# Patient Record
Sex: Male | Born: 1935 | Race: Black or African American | Hispanic: No | Marital: Married | Smoking: Former smoker
Health system: Southern US, Community
[De-identification: ages and names within clinical notes are randomized; demographics above are authoritative.]

## PROBLEM LIST (undated history)

## (undated) DIAGNOSIS — R0601 Orthopnea: Secondary | ICD-10-CM

## (undated) DIAGNOSIS — E119 Type 2 diabetes mellitus without complications: Secondary | ICD-10-CM

## (undated) DIAGNOSIS — F32A Depression, unspecified: Secondary | ICD-10-CM

## (undated) DIAGNOSIS — M1990 Unspecified osteoarthritis, unspecified site: Secondary | ICD-10-CM

## (undated) DIAGNOSIS — G8929 Other chronic pain: Secondary | ICD-10-CM

## (undated) DIAGNOSIS — J9691 Respiratory failure, unspecified with hypoxia: Secondary | ICD-10-CM

## (undated) DIAGNOSIS — F329 Major depressive disorder, single episode, unspecified: Secondary | ICD-10-CM

## (undated) DIAGNOSIS — Z87891 Personal history of nicotine dependence: Secondary | ICD-10-CM

## (undated) DIAGNOSIS — E1142 Type 2 diabetes mellitus with diabetic polyneuropathy: Secondary | ICD-10-CM

## (undated) DIAGNOSIS — M545 Low back pain, unspecified: Secondary | ICD-10-CM

## (undated) DIAGNOSIS — N184 Chronic kidney disease, stage 4 (severe): Secondary | ICD-10-CM

## (undated) DIAGNOSIS — I5032 Chronic diastolic (congestive) heart failure: Secondary | ICD-10-CM

## (undated) DIAGNOSIS — N529 Male erectile dysfunction, unspecified: Secondary | ICD-10-CM

## (undated) DIAGNOSIS — I10 Essential (primary) hypertension: Secondary | ICD-10-CM

## (undated) DIAGNOSIS — R0600 Dyspnea, unspecified: Secondary | ICD-10-CM

## (undated) DIAGNOSIS — N179 Acute kidney failure, unspecified: Secondary | ICD-10-CM

## (undated) DIAGNOSIS — N189 Chronic kidney disease, unspecified: Secondary | ICD-10-CM

## (undated) DIAGNOSIS — E785 Hyperlipidemia, unspecified: Secondary | ICD-10-CM

## (undated) DIAGNOSIS — I639 Cerebral infarction, unspecified: Secondary | ICD-10-CM

## (undated) DIAGNOSIS — F0390 Unspecified dementia without behavioral disturbance: Secondary | ICD-10-CM

## (undated) HISTORY — PX: LUMBAR DISC SURGERY: SHX700

## (undated) HISTORY — DX: Male erectile dysfunction, unspecified: N52.9

## (undated) HISTORY — PX: BACK SURGERY: SHX140

## (undated) HISTORY — PX: CATARACT EXTRACTION W/ INTRAOCULAR LENS  IMPLANT, BILATERAL: SHX1307

## (undated) HISTORY — DX: Hyperlipidemia, unspecified: E78.5

## (undated) HISTORY — DX: Personal history of nicotine dependence: Z87.891

## (undated) HISTORY — DX: Unspecified osteoarthritis, unspecified site: M19.90

## (undated) HISTORY — DX: Depression, unspecified: F32.A

## (undated) HISTORY — DX: Essential (primary) hypertension: I10

## (undated) HISTORY — DX: Major depressive disorder, single episode, unspecified: F32.9

---

## 1969-03-19 HISTORY — PX: LESION EXCISION: SHX5167

## 1979-03-20 DIAGNOSIS — I639 Cerebral infarction, unspecified: Secondary | ICD-10-CM

## 1979-03-20 HISTORY — DX: Cerebral infarction, unspecified: I63.9

## 1997-12-04 ENCOUNTER — Ambulatory Visit (HOSPITAL_COMMUNITY): Admission: RE | Admit: 1997-12-04 | Discharge: 1997-12-04 | Payer: Self-pay | Admitting: Neurosurgery

## 1999-08-04 HISTORY — PX: ELECTROCARDIOGRAM: SHX264

## 1999-08-13 HISTORY — PX: OTHER SURGICAL HISTORY: SHX169

## 2004-02-13 HISTORY — PX: OTHER SURGICAL HISTORY: SHX169

## 2004-05-22 ENCOUNTER — Ambulatory Visit: Payer: Self-pay | Admitting: Internal Medicine

## 2004-05-29 ENCOUNTER — Ambulatory Visit: Payer: Self-pay | Admitting: Internal Medicine

## 2004-06-02 ENCOUNTER — Ambulatory Visit: Payer: Self-pay | Admitting: Internal Medicine

## 2004-09-08 ENCOUNTER — Ambulatory Visit: Payer: Self-pay | Admitting: Internal Medicine

## 2004-12-24 ENCOUNTER — Ambulatory Visit: Payer: Self-pay | Admitting: Internal Medicine

## 2005-03-11 ENCOUNTER — Ambulatory Visit: Payer: Self-pay | Admitting: Internal Medicine

## 2005-04-05 ENCOUNTER — Ambulatory Visit: Payer: Self-pay | Admitting: Internal Medicine

## 2005-06-17 ENCOUNTER — Ambulatory Visit: Payer: Self-pay | Admitting: Internal Medicine

## 2005-09-23 ENCOUNTER — Ambulatory Visit: Payer: Self-pay | Admitting: Internal Medicine

## 2005-09-24 ENCOUNTER — Ambulatory Visit: Payer: Self-pay | Admitting: Internal Medicine

## 2005-10-12 ENCOUNTER — Ambulatory Visit: Payer: Self-pay | Admitting: Internal Medicine

## 2006-01-18 ENCOUNTER — Ambulatory Visit: Payer: Self-pay | Admitting: Internal Medicine

## 2006-01-22 ENCOUNTER — Ambulatory Visit: Payer: Self-pay | Admitting: Family Medicine

## 2006-01-24 ENCOUNTER — Ambulatory Visit: Payer: Self-pay | Admitting: Internal Medicine

## 2006-03-31 ENCOUNTER — Ambulatory Visit: Payer: Self-pay | Admitting: Internal Medicine

## 2006-08-24 ENCOUNTER — Ambulatory Visit: Payer: Self-pay | Admitting: Internal Medicine

## 2006-08-24 LAB — CONVERTED CEMR LAB
AST: 38 units/L — ABNORMAL HIGH (ref 0–37)
BUN: 36 mg/dL — ABNORMAL HIGH (ref 6–23)
Calcium: 9.6 mg/dL (ref 8.4–10.5)
Chloride: 106 meq/L (ref 96–112)
Direct LDL: 39.6 mg/dL
GFR calc Af Amer: 34 mL/min
GFR calc non Af Amer: 28 mL/min
Hgb A1c MFr Bld: 7.8 % — ABNORMAL HIGH (ref 4.6–6.0)
Total CHOL/HDL Ratio: 3.5
VLDL: 51 mg/dL — ABNORMAL HIGH (ref 0–40)

## 2007-01-16 ENCOUNTER — Ambulatory Visit: Payer: Self-pay | Admitting: Internal Medicine

## 2007-01-28 ENCOUNTER — Encounter: Payer: Self-pay | Admitting: Endocrinology

## 2007-01-28 DIAGNOSIS — N184 Chronic kidney disease, stage 4 (severe): Secondary | ICD-10-CM

## 2007-01-28 DIAGNOSIS — I10 Essential (primary) hypertension: Secondary | ICD-10-CM

## 2007-02-09 ENCOUNTER — Ambulatory Visit: Payer: Self-pay | Admitting: Internal Medicine

## 2007-02-22 ENCOUNTER — Encounter: Admission: RE | Admit: 2007-02-22 | Discharge: 2007-02-22 | Payer: Self-pay | Admitting: Otolaryngology

## 2007-02-24 ENCOUNTER — Ambulatory Visit (HOSPITAL_BASED_OUTPATIENT_CLINIC_OR_DEPARTMENT_OTHER): Admission: RE | Admit: 2007-02-24 | Discharge: 2007-02-24 | Payer: Self-pay | Admitting: Otolaryngology

## 2007-02-24 ENCOUNTER — Encounter (INDEPENDENT_AMBULATORY_CARE_PROVIDER_SITE_OTHER): Payer: Self-pay | Admitting: Otolaryngology

## 2007-05-11 ENCOUNTER — Encounter: Payer: Self-pay | Admitting: Internal Medicine

## 2007-05-19 ENCOUNTER — Encounter: Payer: Self-pay | Admitting: Internal Medicine

## 2007-05-19 ENCOUNTER — Ambulatory Visit: Payer: Self-pay | Admitting: Internal Medicine

## 2007-05-22 ENCOUNTER — Telehealth: Payer: Self-pay | Admitting: Internal Medicine

## 2007-08-26 ENCOUNTER — Ambulatory Visit: Payer: Self-pay | Admitting: Family Medicine

## 2007-08-26 DIAGNOSIS — J209 Acute bronchitis, unspecified: Secondary | ICD-10-CM

## 2007-09-26 ENCOUNTER — Telehealth: Payer: Self-pay | Admitting: Internal Medicine

## 2007-10-12 ENCOUNTER — Ambulatory Visit: Payer: Self-pay | Admitting: Internal Medicine

## 2007-10-12 DIAGNOSIS — M79609 Pain in unspecified limb: Secondary | ICD-10-CM | POA: Insufficient documentation

## 2007-10-12 LAB — CONVERTED CEMR LAB
BUN: 33 mg/dL — ABNORMAL HIGH (ref 6–23)
Calcium: 9.4 mg/dL (ref 8.4–10.5)
Creatinine, Ser: 2.3 mg/dL — ABNORMAL HIGH (ref 0.4–1.5)
GFR calc non Af Amer: 29 mL/min
Hgb A1c MFr Bld: 8.1 % — ABNORMAL HIGH (ref 4.6–6.0)

## 2007-10-16 ENCOUNTER — Encounter: Payer: Self-pay | Admitting: Internal Medicine

## 2007-10-19 ENCOUNTER — Ambulatory Visit: Payer: Self-pay | Admitting: Internal Medicine

## 2007-11-30 ENCOUNTER — Encounter: Payer: Self-pay | Admitting: Internal Medicine

## 2007-12-21 ENCOUNTER — Ambulatory Visit: Payer: Self-pay | Admitting: Internal Medicine

## 2008-01-04 ENCOUNTER — Telehealth: Payer: Self-pay | Admitting: Internal Medicine

## 2008-01-11 ENCOUNTER — Encounter: Payer: Self-pay | Admitting: Internal Medicine

## 2008-01-15 ENCOUNTER — Telehealth: Payer: Self-pay | Admitting: Internal Medicine

## 2008-01-17 ENCOUNTER — Ambulatory Visit: Payer: Self-pay | Admitting: Internal Medicine

## 2008-01-17 LAB — CONVERTED CEMR LAB
Direct LDL: 37.7 mg/dL
GFR calc Af Amer: 32 mL/min
Glucose, Bld: 220 mg/dL — ABNORMAL HIGH (ref 70–99)
HDL: 24.9 mg/dL — ABNORMAL LOW (ref >39.0)
Hgb A1c MFr Bld: 8.4 % — ABNORMAL HIGH (ref 4.6–6.0)
Potassium: 4.2 meq/L (ref 3.5–5.1)
Sodium: 136 meq/L (ref 135–145)
Total CHOL/HDL Ratio: 4.2

## 2008-01-18 ENCOUNTER — Telehealth: Payer: Self-pay | Admitting: Internal Medicine

## 2008-01-21 ENCOUNTER — Encounter: Payer: Self-pay | Admitting: Internal Medicine

## 2008-01-30 ENCOUNTER — Encounter: Payer: Self-pay | Admitting: Internal Medicine

## 2008-02-16 ENCOUNTER — Telehealth: Payer: Self-pay | Admitting: Internal Medicine

## 2008-04-25 ENCOUNTER — Ambulatory Visit: Payer: Self-pay | Admitting: Internal Medicine

## 2008-06-05 ENCOUNTER — Encounter: Payer: Self-pay | Admitting: Internal Medicine

## 2008-09-18 ENCOUNTER — Telehealth: Payer: Self-pay | Admitting: Internal Medicine

## 2008-09-24 ENCOUNTER — Ambulatory Visit: Payer: Self-pay | Admitting: Internal Medicine

## 2008-09-24 DIAGNOSIS — M25629 Stiffness of unspecified elbow, not elsewhere classified: Secondary | ICD-10-CM | POA: Insufficient documentation

## 2008-10-16 ENCOUNTER — Telehealth: Payer: Self-pay | Admitting: Internal Medicine

## 2008-11-22 ENCOUNTER — Encounter: Payer: Self-pay | Admitting: Internal Medicine

## 2008-12-16 ENCOUNTER — Encounter: Payer: Self-pay | Admitting: Internal Medicine

## 2008-12-20 ENCOUNTER — Ambulatory Visit: Payer: Self-pay | Admitting: Internal Medicine

## 2008-12-20 DIAGNOSIS — M109 Gout, unspecified: Secondary | ICD-10-CM

## 2008-12-21 DIAGNOSIS — E1121 Type 2 diabetes mellitus with diabetic nephropathy: Secondary | ICD-10-CM

## 2008-12-23 ENCOUNTER — Telehealth: Payer: Self-pay | Admitting: Internal Medicine

## 2008-12-27 ENCOUNTER — Telehealth: Payer: Self-pay | Admitting: Internal Medicine

## 2008-12-28 ENCOUNTER — Ambulatory Visit: Payer: Self-pay | Admitting: Family Medicine

## 2008-12-28 ENCOUNTER — Telehealth: Payer: Self-pay | Admitting: Family Medicine

## 2008-12-28 DIAGNOSIS — R112 Nausea with vomiting, unspecified: Secondary | ICD-10-CM

## 2008-12-28 LAB — CONVERTED CEMR LAB
Creatinine, Ser: 3.08 mg/dL
Sodium, serum: 137 mmol/L

## 2008-12-30 LAB — CONVERTED CEMR LAB
BUN: 87 mg/dL — ABNORMAL HIGH (ref 6–23)
CO2: 23 meq/L (ref 19–32)
Chloride: 103 meq/L (ref 96–112)
Creatinine, Ser: 3.08 mg/dL — ABNORMAL HIGH (ref 0.40–1.20)
Glucose, Bld: 253 mg/dL — ABNORMAL HIGH (ref 70–99)
Potassium: 3.9 meq/L (ref 3.5–5.3)

## 2009-01-01 ENCOUNTER — Telehealth: Payer: Self-pay | Admitting: Internal Medicine

## 2009-01-06 ENCOUNTER — Encounter: Payer: Self-pay | Admitting: Internal Medicine

## 2009-01-06 LAB — HM DIABETES EYE EXAM: HM Diabetic Eye Exam: NORMAL

## 2009-01-13 ENCOUNTER — Ambulatory Visit: Payer: Self-pay | Admitting: Internal Medicine

## 2009-01-13 DIAGNOSIS — R0789 Other chest pain: Secondary | ICD-10-CM

## 2009-01-14 ENCOUNTER — Ambulatory Visit: Payer: Self-pay | Admitting: Cardiology

## 2009-01-14 ENCOUNTER — Telehealth: Payer: Self-pay | Admitting: Internal Medicine

## 2009-01-16 ENCOUNTER — Telehealth: Payer: Self-pay | Admitting: Internal Medicine

## 2009-01-22 ENCOUNTER — Telehealth (INDEPENDENT_AMBULATORY_CARE_PROVIDER_SITE_OTHER): Payer: Self-pay

## 2009-01-23 ENCOUNTER — Ambulatory Visit: Payer: Self-pay

## 2009-01-23 ENCOUNTER — Encounter: Payer: Self-pay | Admitting: Cardiology

## 2009-01-29 ENCOUNTER — Ambulatory Visit (HOSPITAL_COMMUNITY): Admission: RE | Admit: 2009-01-29 | Discharge: 2009-01-29 | Payer: Self-pay | Admitting: Internal Medicine

## 2009-01-29 ENCOUNTER — Encounter (INDEPENDENT_AMBULATORY_CARE_PROVIDER_SITE_OTHER): Payer: Self-pay | Admitting: *Deleted

## 2009-02-02 ENCOUNTER — Telehealth: Payer: Self-pay | Admitting: Internal Medicine

## 2009-02-04 ENCOUNTER — Encounter: Payer: Self-pay | Admitting: Internal Medicine

## 2009-02-10 ENCOUNTER — Telehealth: Payer: Self-pay | Admitting: Internal Medicine

## 2009-02-14 ENCOUNTER — Ambulatory Visit: Payer: Self-pay | Admitting: Cardiology

## 2009-02-17 ENCOUNTER — Telehealth: Payer: Self-pay | Admitting: Internal Medicine

## 2009-02-28 ENCOUNTER — Telehealth: Payer: Self-pay | Admitting: Cardiology

## 2009-03-17 ENCOUNTER — Telehealth: Payer: Self-pay | Admitting: Cardiology

## 2009-03-21 ENCOUNTER — Telehealth: Payer: Self-pay | Admitting: Internal Medicine

## 2009-04-01 ENCOUNTER — Telehealth: Payer: Self-pay | Admitting: Cardiology

## 2009-04-21 ENCOUNTER — Ambulatory Visit: Payer: Self-pay | Admitting: Internal Medicine

## 2009-05-17 ENCOUNTER — Telehealth: Payer: Self-pay | Admitting: Internal Medicine

## 2009-06-03 ENCOUNTER — Encounter: Payer: Self-pay | Admitting: Internal Medicine

## 2009-07-21 ENCOUNTER — Telehealth: Payer: Self-pay | Admitting: Internal Medicine

## 2009-07-25 ENCOUNTER — Telehealth: Payer: Self-pay | Admitting: Internal Medicine

## 2009-08-06 ENCOUNTER — Telehealth: Payer: Self-pay | Admitting: Internal Medicine

## 2009-08-14 ENCOUNTER — Telehealth: Payer: Self-pay | Admitting: Internal Medicine

## 2009-08-18 ENCOUNTER — Ambulatory Visit: Payer: Self-pay | Admitting: Internal Medicine

## 2009-09-10 ENCOUNTER — Telehealth: Payer: Self-pay | Admitting: Internal Medicine

## 2009-09-13 ENCOUNTER — Telehealth: Payer: Self-pay | Admitting: Internal Medicine

## 2009-09-30 ENCOUNTER — Encounter: Payer: Self-pay | Admitting: Internal Medicine

## 2009-10-07 ENCOUNTER — Telehealth: Payer: Self-pay | Admitting: Internal Medicine

## 2009-10-17 ENCOUNTER — Telehealth: Payer: Self-pay | Admitting: Internal Medicine

## 2009-10-23 ENCOUNTER — Encounter: Payer: Self-pay | Admitting: Internal Medicine

## 2009-11-13 ENCOUNTER — Encounter: Payer: Self-pay | Admitting: Internal Medicine

## 2009-11-21 ENCOUNTER — Telehealth: Payer: Self-pay | Admitting: Internal Medicine

## 2009-11-25 ENCOUNTER — Telehealth: Payer: Self-pay | Admitting: Internal Medicine

## 2009-11-28 ENCOUNTER — Telehealth: Payer: Self-pay | Admitting: Internal Medicine

## 2009-12-04 ENCOUNTER — Telehealth: Payer: Self-pay | Admitting: Internal Medicine

## 2009-12-12 ENCOUNTER — Ambulatory Visit: Payer: Self-pay | Admitting: Internal Medicine

## 2010-03-05 ENCOUNTER — Telehealth: Payer: Self-pay | Admitting: Internal Medicine

## 2010-03-17 ENCOUNTER — Telehealth: Payer: Self-pay | Admitting: Internal Medicine

## 2010-04-02 ENCOUNTER — Telehealth: Payer: Self-pay | Admitting: Internal Medicine

## 2010-05-06 ENCOUNTER — Encounter: Payer: Self-pay | Admitting: Internal Medicine

## 2010-05-07 ENCOUNTER — Telehealth: Payer: Self-pay | Admitting: Internal Medicine

## 2010-05-15 ENCOUNTER — Ambulatory Visit: Payer: Self-pay | Admitting: Internal Medicine

## 2010-05-25 ENCOUNTER — Telehealth: Payer: Self-pay | Admitting: Internal Medicine

## 2010-06-12 ENCOUNTER — Telehealth: Payer: Self-pay | Admitting: Family Medicine

## 2010-07-01 ENCOUNTER — Telehealth: Payer: Self-pay | Admitting: Internal Medicine

## 2010-07-08 ENCOUNTER — Telehealth: Payer: Self-pay | Admitting: Internal Medicine

## 2010-07-23 ENCOUNTER — Ambulatory Visit
Admission: RE | Admit: 2010-07-23 | Discharge: 2010-07-23 | Payer: Self-pay | Source: Home / Self Care | Attending: Internal Medicine | Admitting: Internal Medicine

## 2010-07-31 ENCOUNTER — Telehealth: Payer: Self-pay | Admitting: Internal Medicine

## 2010-08-10 ENCOUNTER — Encounter: Payer: Self-pay | Admitting: Internal Medicine

## 2010-08-18 NOTE — Progress Notes (Signed)
  Phone Note Refill Request Message from:  Fax from Pharmacy on October 17, 2009 2:09 PM  Refills Requested: Medication #1:  DOXAZOSIN MESYLATE 4 MG TABS Take 1 tablet by mouth at bedtime Initial call taken by: Ami Bullins CMA,  October 17, 2009 2:09 PM    Prescriptions: DOXAZOSIN MESYLATE 4 MG TABS (DOXAZOSIN MESYLATE) Take 1 tablet by mouth at bedtime  #60 x 3   Entered by:   Ami Bullins CMA   Authorized by:   Jacques Navy MD   Signed by:   Bill Salinas CMA on 10/17/2009   Method used:   Faxed to ...       [facility] (retail)       [address]       [place], [place]  [address]       Ph: [phone]       Fax: [phone]   RxID:   [id number]

## 2010-08-18 NOTE — Progress Notes (Signed)
Summary: Blood Sugar Tests from patient  Blood Sugar Tests from patient   Imported By: [place] 11/12/2009 07:55:58  _____________________________________________________________________  External Attachment:    Type:   Image     Comment:   External Document

## 2010-08-18 NOTE — Progress Notes (Signed)
  Phone Note Refill Request   Refills Requested: Medication #1:  NORVASC 10 MG TABS 1 daily Initial call taken by: Lamar Sprinkles, CMA,  October 07, 2009 1:27 PM    Prescriptions: NORVASC 10 MG TABS (AMLODIPINE BESYLATE) 1 daily  #30 x 6   Entered by:   Lamar Sprinkles, CMA   Authorized by:   Jacques Navy MD   Signed by:   Lamar Sprinkles, CMA on 10/07/2009   Method used:   Faxed to ...       [facility] (retail)       [address]       [place], [place]  [address]       Ph: [phone]       Fax: [phone]   RxID:   [id number]

## 2010-08-18 NOTE — Letter (Signed)
Summary: [facility]   Imported By: Sherian Rein 05/15/2010 13:48:25  _____________________________________________________________________  External Attachment:    Type:   Image     Comment:   External Document

## 2010-08-18 NOTE — Progress Notes (Signed)
  Phone Note Refill Request Message from:  Fax from Pharmacy on May 25, 2010 3:45 PM  Refills Requested: Medication #1:  DOXAZOSIN MESYLATE 4 MG TABS Take 1 tablet by mouth at bedtime   Last Refilled: 01/16/2010 received fax from [facility] for Doxazosin Mesylate  Initial call taken by: Alysia Penna,  May 25, 2010 3:46 PM    Prescriptions: DOXAZOSIN MESYLATE 4 MG TABS (DOXAZOSIN MESYLATE) Take 1 tablet by mouth at bedtime  #60 x 6   Entered and Authorized by:   Alysia Penna   Signed by:   Alysia Penna on 05/25/2010   Method used:   Faxed to ...       [facility] (retail)       [address]       [place], [place]  [address]       Ph: [phone]       Fax: [phone]   RxID:   [id number]

## 2010-08-18 NOTE — Progress Notes (Signed)
  Phone Note Refill Request   Refills Requested: Medication #1:  MONOPRIL 40 MG  TABS take 1 by mouth qd  Medication #2:  LANTUS 100 UNIT/ML  SOLN use 65 units at bedtime  Medication #3:  LIPITOR 10 MG  TABS TAKE1 by mouth once daily Initial call taken by: Ami Bullins CMA,  July 21, 2009 12:55 PM    Prescriptions: LIPITOR 10 MG  TABS (ATORVASTATIN CALCIUM) TAKE1 by mouth once daily  #30 x 3   Entered by:   Ami Bullins CMA   Authorized by:   Jacques Navy MD   Signed by:   Bill Salinas CMA on 07/21/2009   Method used:   Faxed to ...       [facility] (ret[facility])       [address]       [place], [place]  [address]       Ph: [phone]       Fax: [phone]   RxID:   [id number] LANTUS 100 UNIT/ML  SOLN (INSULIN GLARGINE) use 65 units at bedtime  #1 mth x 3   Entered by:   Ami Bullins CMA   Authorized by:   Jacques Navy MD   Signed by:   Bill Salinas CMA on 07/21/2009   Method used:   Faxed to ...       [facility] (ret[facility])       [address]       [place], [place]  [address]       Ph: [phone]       Fax: [phone]   RxID:   [id number] MONOPRIL 40 MG  TABS (FOSINOPRIL SODIUM) take 1 by mouth qd  #30 x 3   Entered by:   Bill Salinas CMA   Authorized by:   Jacques Navy MD   Signed by:   Bill Salinas CMA on 07/21/2009   Method used:   Faxed to ...       [facility] (retail)       [address]       [place], [place]  [address]       Ph: [phone]       Fax: [phone]   RxID:   [id number]

## 2010-08-18 NOTE — Assessment & Plan Note (Signed)
Summary: BS HAS BEEN HIGH FOR 2 WKS/ WIFE ALSO LMOM @ TRIAGE THURS/NWS   Vital Signs:  Patient profile:   77 year old male Height:      70 inches Weight:      228 pounds BMI:     32.83 O2 Sat:      98 % on Room air Temp:     97.2 degrees F oral Pulse rate:   68 / minute BP sitting:   130 / 58  (left arm) Cuff size:   large  Vitals Entered By: Bill Salinas CMA (August 18, 2009 3:24 PM)  O2 Flow:  Room air CC: pt here for evaluation of elevated CBGs/ pt is due for tetanus shot and has never had zostavax/ ab   Primary Care Provider:  Jacques Navy MD  CC:  pt here for evaluation of elevated CBGs/ pt is due for tetanus shot and has never had zostavax/ ab.  History of Present Illness: Patient presents today for poor control of diabetes with CBGs running too high most of the time. He takes Lantus 65 units every PM,  takes humalog 40 units before bkfst, 45 units after lunch. He has not had any symptoms. Last A1C Nov '10 9.7%, in July 8.4%.  Current Medications (verified): 1)  Monopril 40 Mg  Tabs (Fosinopril Sodium) .... Take 1 By Mouth Qd 2)  Al[employer] Ec Aspirin 325 Mg  Tbec (Aspirin) .... Take 2 By Mouth Once Daily 3)  Clonidine Hcl 0.3 Mg  Tabs (Clonidine Hcl) .... Take 1 Three Times A Day By Mouth Qd 4)  Lasix 40 Mg Tabs (Furosemide) .... 3 By Mouth Once Daily 5)  Klor-Con M20 20 Meq  Tbcr (Potassium Chloride Crys Cr) .... Take 1 Two Times A Day By Mouth Qd 6)  Humalog 100 Unit/ml  Soln (Insulin Lispro (Human)) .... 40 Units Before Dinner,  45 Units Before Supper 7)  Lantus 100 Unit/ml  Soln (Insulin Glargine) .... Use 65 Units At Bedtime 8)  Calcitriol 0.25 Mcg  Caps (Calcitriol) .... Take 1 By Mouth Qd 9)  Lipitor 10 Mg  Tabs (Atorvastatin Calcium) .... Take1 By Mouth Once Daily 10)  Colchicine 0.6 Mg  Tabs (Colchicine) .... Take 1 By Mouth Qd 11)  Bd Insulin Syringe Microfine 28g X 1/2" 0.5 Ml  Misc (Insulin Syringe-Needle U-100) .... Use Three Times A Day As Directed 12)   Allopurinol 300 Mg Tabs (Allopurinol) .... 1/2 By Mouth Once Daily 13)  Metoclopramide Hcl 5 Mg Tabs (Metoclopramide Hcl) .Marland Kitchen.. 1 Three Times A Day Before Meals. Do Not Take 48 Hrs Prior To Gastric Emptying Study 14)  Elavil  25 Mg .... Take 1 By Mouth Once Daily 15)  Coreg 25 Mg Tabs (Carvedilol) .Marland Kitchen.. 1 Twice A Day 16)  Norvasc 10 Mg Tabs (Amlodipine Besylate) .Marland Kitchen.. 1 Daily 17)  Doxazosin Mesylate 4 Mg Tabs (Doxazosin Mesylate) .... Take 1 Tablet By Mouth At Bedtime 18)  Aspirin 325 Mg Tabs (Aspirin) .Marland Kitchen.. 1 Once Daily  Allergies (verified): No Known Drug Allergies PMH-FH-SH reviewed-no changes except otherwise noted  Review of Systems       The patient complains of difficulty walking.  The patient denies anorexia, fever, weight loss, chest pain, dyspnea on exertion, abdominal pain, and depression.    Physical Exam  General:  heavy set AA male in no distress Head:  normocephalic and atraumatic.   Eyes:  pupils equal and pupils round.   Neck:  supple.   Lungs:  normal respiratory  effort and normal breath sounds.   Heart:  normal rate and regular rhythm.   Neurologic:  alert & oriented X3 and cranial nerves II-XII intact.  Antalgic gait - slow Skin:  turgor normal and color normal.   Psych:  Oriented X3, normally interactive, and good eye contact.     Impression & Recommendations:  Problem # 1:  DIABETES MELLITUS, TYPE II (ICD-250.00) Poor control that has gotten worse from July to November.   Plan  Increase lantus to 70units at bedtime         Humalog 40 units before bksft and lunch, 45 units before supper         CBG before bkfst and 90 minutes after meals x 10 days. and reassess.  His updated medication list for this problem includes:    Monopril 40 Mg Tabs (Fosinopril sodium) .Marland Kitchen... Take 1 by mouth qd    Albertsons Ec Aspirin 325 Mg Tbec (Aspirin) .Marland Kitchen... Take 2 by mouth once daily    Humalog 100 Unit/ml Soln (Insulin lispro (human)) .Marland KitchenMarland KitchenMarland KitchenMarland Kitchen 40 units before breakfast and lunch,   45 units before supper    Lantus 100 Unit/ml Soln (Insulin glargine) ..... Use 70 units at bedtime    Aspirin 325 Mg Tabs (Aspirin) .Marland Kitchen... 1 once daily  Complete Medication List: 1)  Monopril 40 Mg Tabs (Fosinopril sodium) .... Take 1 by mouth qd 2)  Albertsons Ec Aspirin 325 Mg Tbec (Aspirin) .... Take 2 by mouth once daily 3)  Clonidine Hcl 0.3 Mg Tabs (Clonidine hcl) .... Take 1 three times a day by mouth qd 4)  Lasix 40 Mg Tabs (Furosemide) .... 3 by mouth once daily 5)  Klor-con M20 20 Meq Tbcr (Potassium chloride crys cr) .... Take 1 two times a day by mouth qd 6)  Humalog 100 Unit/ml Soln (Insulin lispro (human)) .... 40 units before breakfast and lunch,  45 units before supper 7)  Lantus 100 Unit/ml Soln (Insulin glargine) .... Use 70 units at bedtime 8)  Calcitriol 0.25 Mcg Caps (Calcitriol) .... Take 1 by mouth qd 9)  Lipitor 10 Mg Tabs (Atorvastatin calcium) .... Take1 by mouth once daily 10)  Colchicine 0.6 Mg Tabs (Colchicine) .... Take 1 by mouth qd 11)  Bd Insulin Syringe Microfine 28g X 1/2" 0.5 Ml Misc (Insulin syringe-needle u-100) .... Use three times a day as directed 12)  Allopurinol 300 Mg Tabs (Allopurinol) .... 1/2 by mouth once daily 13)  Metoclopramide Hcl 5 Mg Tabs (Metoclopramide hcl) .Marland Kitchen.. 1 three times a day before meals. do not take 48 hrs prior to gastric emptying study 14)  Elavil 25 Mg  .... Take 1 by mouth once daily 15)  Coreg 25 Mg Tabs (Carvedilol) .Marland Kitchen.. 1 twice a day 16)  Norvasc 10 Mg Tabs (Amlodipine besylate) .Marland Kitchen.. 1 daily 17)  Doxazosin Mesylate 4 Mg Tabs (Doxazosin mesylate) .... Take 1 tablet by mouth at bedtime 18)  Aspirin 325 Mg Tabs (Aspirin) .Marland Kitchen.. 1 once daily  Patient Instructions: 1)  diabetes - poor control. Plan - increase lantus to 70 units before bedtime. Humalog 40units before breakfast and lunch, 45 units before supper. Check sugar before breakfast and 90 minutes after meals. Keep a record and turn it in to me in 10 days.

## 2010-08-18 NOTE — Progress Notes (Signed)
Summary: Gout issues  Phone Note Call from Patient Call back at Home Phone [phone]   Caller: Spouse  Summary of Call: Patient spouse called stating that the patient was having gout pain. She notes that the patient is taking Allopurinol, but he is not taking Colcrys that is on his med list. I advised her that we would send Colcrys prescription to the pharmacy and to call Monday for an appt if it did not help. She expressed understanding. Initial call taken by: Lucious Groves CMA,  June 12, 2010 10:45 AM  Follow-up for Phone Call        Agree with using colcrys for acute gout flare. Follow up with primary MD if not improving.  Corection to below.Marland Kitchenauthorized by Dr. Ermalene Searing not Dr. Debby Bud. Follow-up by: Kerby Nora MD,  June 12, 2010 11:01 AM    Prescriptions: COLCRYS 0.6 MG TABS (COLCHICINE) 2 tabs now then 2 tabs daily while having gout pain  #6 x 0   Entered by:   Lucious Groves CMA   Authorized by:   Jacques Navy MD   Signed by:   Lucious Groves CMA on 06/12/2010   Method used:   Faxed to ...       [facility] (retail)       [address]       [place], [place]  [address]       Ph: [phone]       Fax: [phone]   RxID:   [id number]

## 2010-08-18 NOTE — Progress Notes (Signed)
Summary: Lipitor refill  Phone Note Refill Request Message from:  Fax from Pharmacy on Nov 25, 2009 3:06 PM  Refills Requested: Medication #1:  LIPITOR 10 MG  TABS TAKE1 by mouth once daily Initial call taken by: Lucious Groves,  Nov 25, 2009 3:06 PM    Prescriptions: LIPITOR 10 MG  TABS (ATORVASTATIN CALCIUM) TAKE1 by mouth once daily  #30 x 3   Entered by:   Lucious Groves   Authorized by:   Jacques Navy MD   Signed by:   Lucious Groves on 11/25/2009   Method used:   Faxed to ...       [facility] (retail)       [address]       [place], [place]  [address]       Ph: [phone]       Fax: [phone]   RxID:   [id number]

## 2010-08-18 NOTE — Progress Notes (Signed)
  Phone Note Outgoing Call   Reason for Call: Discuss lab or test results Summary of Call: patien taking insulin 40units before bkfst and lunch, 45 units before supper. Reviewed theCBGs that are post-prandial and toward the end of the reprt the values looked a lot better. Continue the present regieen Initial call taken by: Jacques Navy MD,  September 13, 2009 10:32 AM  Follow-up for Phone Call        informed pt  Follow-up by: Ami Bullins CMA,  September 15, 2009 11:16 AM

## 2010-08-18 NOTE — Progress Notes (Signed)
  Phone Note Refill Request Message from:  Fax from Pharmacy on Nov 28, 2009 3:11 PM  Refills Requested: Medication #1:  KLOR-CON M20 20 MEQ  TBCR take 1 two times a day by mouth qd Initial call taken by: Ami Bullins CMA,  Nov 28, 2009 3:11 PM    Prescriptions: KLOR-CON M20 20 MEQ  TBCR (POTASSIUM CHLORIDE CRYS CR) take 1 two times a day by mouth qd  #60 x 3   Entered by:   Ami Bullins CMA   Authorized by:   Jacques Navy MD   Signed by:   Bill Salinas CMA on 11/28/2009   Method used:   Faxed to ...       [facility] (retail)       [address]       [place], [place]  [address]       Ph: [phone]       Fax: [phone]   RxID:   [id number]

## 2010-08-18 NOTE — Letter (Signed)
Summary: [facility]  [facility]   Imported By: Sherian Rein 11/26/2009 09:40:30  _____________________________________________________________________  External Attachment:    Type:   Image     Comment:   External Document

## 2010-08-18 NOTE — Progress Notes (Signed)
Summary: Elevated blood sugar  Phone Note Call from Patient Call back at Home Phone [phone]   Caller: Spouse Summary of Call: Patient called stating that her blood glucose has been elevated. Pt mailed in list/log of how it has been running. This afternoon it was 318. Please advise. Initial call taken by: Lucious Groves,  September 10, 2009 5:10 PM  Follow-up for Phone Call        Per MD--this patient runs high so this is ok. Continue meds as planned. They will call as needed. Follow-up by: Lucious Groves,  September 10, 2009 5:13 PM

## 2010-08-18 NOTE — Progress Notes (Signed)
Summary: REFILLS  Phone Note Call from Patient   Summary of Call: Patient is requesting refills Initial call taken by: Lamar Sprinkles, CMA,  Nov 21, 2009 8:16 AM    New/Updated Medications: ONETOUCH TEST  STRP (GLUCOSE BLOOD) test four times a day as needed DX 250.00 Prescriptions: ONETOUCH TEST  STRP (GLUCOSE BLOOD) test four times a day as needed DX 250.00  #100 x 3   Entered by:   Lamar Sprinkles, CMA   Authorized by:   Jacques Navy MD   Signed by:   Lamar Sprinkles, CMA on 11/21/2009   Method used:   Faxed to ...       [facility] ([facility])       [address]       [place], [place]  [address]       Ph: [phone]       Fax: [phone]   RxID:   [id number] HUMALOG 100 UNIT/ML  SOLN (INSULIN LISPRO (HUMAN)) 40 units before breakfast and lunch,  45 units before supper  #1 mth x 3   Entered by:   Lamar Sprinkles, CMA   Authorized by:   Jacques Navy MD   Signed by:   Lamar Sprinkles, CMA on 11/21/2009   Method used:   Print then Give to Patient   RxID:   [id number] LANTUS 100 UNIT/ML  SOLN (INSULIN GLARGINE) use 70 units at bedtime  #1 mth x 3   Entered by:   Lamar Sprinkles, CMA   Authorized by:   Jacques Navy MD   Signed by:   Lamar Sprinkles, CMA on 11/21/2009   Method used:   Print then Give to Patient   RxID:   [id number]

## 2010-08-18 NOTE — Progress Notes (Signed)
Summary: BP readings/Patient  BP readings/Patient   Imported By: Sherian Rein 12/17/2009 13:23:20  _____________________________________________________________________  External Attachment:    Type:   Image     Comment:   External Document

## 2010-08-18 NOTE — Progress Notes (Signed)
  Phone Note Refill Request Message from:  Fax from Pharmacy on August 06, 2009 1:15 PM  Refills Requested: Medication #1:  HUMALOG 100 UNIT/ML  SOLN 40 units before dinner Initial call taken by: Ami Bullins CMA,  August 06, 2009 1:15 PM    Prescriptions: HUMALOG 100 UNIT/ML  SOLN (INSULIN LISPRO (HUMAN)) 40 units before dinner,  45 units before supper  #1 mth x 3   Entered by:   Ami Bullins CMA   Authorized by:   Jacques Navy MD   Signed by:   Bill Salinas CMA on 08/06/2009   Method used:   Faxed to ...       [facility] (retail)       [address]       [place], [place]  [address]       Ph: [phone]       Fax: [phone]   RxID:   [id number]

## 2010-08-18 NOTE — Progress Notes (Signed)
Summary: Elevated cbgs  Phone Note Call from Patient   Caller: [phone] Summary of Call: Pt's cbgs recently 270's, 280''s & 250's. Should he change his meds? Pt has f/u monday.  Initial call taken by: Lamar Sprinkles, CMA,  August 14, 2009 5:34 PM  Follow-up for Phone Call        This can wait until Monday Follow-up by: Jacques Navy MD,  August 14, 2009 5:58 PM  Additional Follow-up for Phone Call Additional follow up Details #1::        Pt informed  Additional Follow-up by: Lamar Sprinkles, CMA,  August 15, 2009 9:53 AM

## 2010-08-18 NOTE — Assessment & Plan Note (Signed)
Summary: check sugar/#/ cd   Vital Signs:  Patient profile:   77 year old male Height:      70 inches Weight:      238 pounds BMI:     34.27 O2 Sat:      94 % on Room air Temp:     98.3 degrees F oral Pulse rate:   76 / minute BP sitting:   120 / 58  (left arm) Cuff size:   large  Vitals Entered By: Bill Salinas CMA (Dec 12, 2009 1:04 PM)  O2 Flow:  Room air CC: pt here for follow up on Blood sugar. PT states he took CBG today at noon with reading of 133. Pt also has a record and seems that his CBG average in the mid 100's/ ab   Primary Care Provider:  Jacques Navy MD  CC:  pt here for follow up on Blood sugar. PT states he took CBG today at noon with reading of 133. Pt also has a record and seems that his CBG average in the mid 100's/ ab.  History of Present Illness: Patient presents for follow-up of Diabetes: Reviewed CBG record: he does have some low readings in the mornings. In the main the CBGFs have been much better (see scanned sheet).  He is feeling good. Had labs April 28th at [facility]: Total cholesterol 169, HDL 28, LDL 21!!. A1C 7.3%.   Current Medications (verified): 1)  Monopril 40 Mg  Tabs (Fosinopril Sodium) .... Take 1 By Mouth Qd 2)  [employer] Ec Aspirin 325 Mg  Tbec (Aspirin) .... Take 2 By Mouth Once Daily 3)  Clonidine Hcl 0.3 Mg  Tabs (Clonidine Hcl) .... Take 1 Three Times A Day By Mouth Qd 4)  Lasix 40 Mg Tabs (Furosemide) .... 3 By Mouth Once Daily 5)  Klor-Con M20 20 Meq  Tbcr (Potassium Chloride Crys Cr) .... Take 1 Two Times A Day By Mouth Qd 6)  Humalog 100 Unit/ml  Soln (Insulin Lispro (Human)) .... 40 Units Before Breakfast and Lunch,  45 Units Before Supper 7)  Lantus 100 Unit/ml  Soln (Insulin Glargine) .... Use 70 Units At Bedtime 8)  Calcitriol 0.25 Mcg  Caps (Calcitriol) .... Take 1 By Mouth Qd 9)  Lipitor 10 Mg  Tabs (Atorvastatin Calcium) .... Take1 By Mouth Once Daily 10)  Colchicine 0.6 Mg  Tabs (Colchicine) .... Take 1 By  Mouth Qd 11)  Bd Insulin Syringe Microfine 28g X 1/2" 0.5 Ml  Misc (Insulin Syringe-Needle U-100) .... Use Three Times A Day As Directed 12)  Allopurinol 300 Mg Tabs (Allopurinol) .... 1/2 By Mouth Once Daily 13)  Metoclopramide Hcl 5 Mg Tabs (Metoclopramide Hcl) .Marland Kitchen.. 1 Three Times A Day Before Meals. Do Not Take 48 Hrs Prior To Gastric Emptying Study 14)  Elavil  25 Mg .... Take 1 By Mouth Once Daily 15)  Coreg 25 Mg Tabs (Carvedilol) .Marland Kitchen.. 1 Twice A Day 16)  Norvasc 10 Mg Tabs (Amlodipine Besylate) .Marland Kitchen.. 1 Daily 17)  Doxazosin Mesylate 4 Mg Tabs (Doxazosin Mesylate) .... Take 1 Tablet By Mouth At Bedtime 18)  Aspirin 325 Mg Tabs (Aspirin) .Marland Kitchen.. 1 Once Daily 19)  Onetouch Test  Strp (Glucose Blood) .... Test Four Times A Day As Needed Dx 250.00  Allergies (verified): No Known Drug Allergies  Past History:  Past Medical History: Last updated: 02/14/2009 1. Gout 2. Hypertension 3. Renal insufficiency: CKD stage III, last creatinine 6/10 was 3 4. Impotence. 5. Smoker (Quit 1988) 6.  Diabetes mellitus, type II 7. h/o CVA 8. s/p lumbar laminectomy 9. depression 10. Hyperlipidemia 11. Osteoarthritis 12. Adenosine myoview (7/10): EF 56%. Fixed inferior defect that represents either old infarction or attenuation (may be attenuation in setting of normal inferior wall motion).  No ischemia.  Low risk study.   Past Surgical History: Last updated: 01/28/2007 Back Surgery Small lesion excised from back of right hand Stress Cardiolite (08/13/1999) Lower Artial Doppler (02/13/2004) EKG (08/04/1999)  Family History: Last updated: 01/14/2009 No premature CAD.   Social History: Last updated: 02/14/2009 married disabled and retired very supportive spouse who usually accompanies him to the doctor. patient no longer smokes and does not drink ETOH.   Risk Factors: Exercise: no (10/12/2007)  Risk Factors: Smoking Status: quit (01/28/2007)  Review of Systems  The patient denies anorexia,  fever, weight loss, decreased hearing, chest pain, dyspnea on exertion, prolonged cough, hemoptysis, abdominal pain, muscle weakness, difficulty walking, depression, enlarged lymph nodes, and angioedema.    Physical Exam  General:  WNWD male in no distress Head:  Normocephalic and atraumatic without obvious abnormalities. No apparent alopecia or balding. Eyes:  pupils equal and pupils round.   Neck:  supple and full ROM.   Lungs:  normal respiratory effort, normal breath sounds, no crackles, and no wheezes.   Heart:  normal rate and regular rhythm.   Abdomen:  soft and normal bowel sounds.   Msk:  normal ROM, no joint tenderness, no joint swelling, and no joint warmth.   Pulses:  2+ radial  Neurologic:  alert & oriented X3, cranial nerves II-XII intact, and strength normal in all extremities.    Diabetes Management Exam:    Eye Exam:       Eye Exam done elsewhere          Date: 01/06/2009          Results: normal          Done by: opthalmologist   Impression & Recommendations:  Problem # 1:  DIABETES MELLITUS, TYPE II (ICD-250.00) Much better control with A1C 7.3%  Plan - reduce lantus to 65 untis otherwise continue present meds  His updated medication list for this problem includes:    Monopril 40 Mg Tabs (Fosinopril sodium) .Marland Kitchen... Take 1 by mouth qd    [employer] Ec Aspirin 325 Mg Tbec (Aspirin) .Marland Kitchen... Take 2 by mouth once daily    Humalog 100 Unit/ml Soln (Insulin lispro (human)) .Marland KitchenMarland KitchenMarland KitchenMarland Kitchen 40 units before breakfast and lunch,  45 units before supper    Lantus 100 Unit/ml Soln (Insulin glargine) ..... Use 65 units at bedtime    Aspirin 325 Mg Tabs (Aspirin) .Marland Kitchen... 1 once daily  Problem # 2:  HYPERTENSION (ICD-401.9)  His updated medication list for this problem includes:    Monopril 40 Mg Tabs (Fosinopril sodium) .Marland Kitchen... Take 1 by mouth qd    Clonidine Hcl 0.3 Mg Tabs (Clonidine hcl) .Marland Kitchen... Take 1 three times a day by mouth qd    Lasix 40 Mg Tabs (Furosemide) .Marland KitchenMarland KitchenMarland KitchenMarland Kitchen 3 by mouth once  daily    Coreg 25 Mg Tabs (Carvedilol) .Marland Kitchen... 1 twice a day    Norvasc 10 Mg Tabs (Amlodipine besylate) .Marland Kitchen... 1 daily    Doxazosin Mesylate 4 Mg Tabs (Doxazosin mesylate) .Marland Kitchen... Take 1 tablet by mouth at bedtime  BP today: 120/58 Prior BP: 130/58 (08/18/2009)  Good control. Continue present meds.   Problem # 3:  RENAL INSUFFICIENCY (ICD-588.9) stable - see Dr. Roanna Banning last note.   Complete Medication List:  1)  Monopril 40 Mg Tabs (Fosinopril sodium) .... Take 1 by mouth qd 2)  [employer] Ec Aspirin 325 Mg Tbec (Aspirin) .... Take 2 by mouth once daily 3)  Clonidine Hcl 0.3 Mg Tabs (Clonidine hcl) .... Take 1 three times a day by mouth qd 4)  Lasix 40 Mg Tabs (Furosemide) .... 3 by mouth once daily 5)  Klor-con M20 20 Meq Tbcr (Potassium chloride crys cr) .... Take 1 two times a day by mouth qd 6)  Humalog 100 Unit/ml Soln (Insulin lispro (human)) .... 40 units before breakfast and lunch,  45 units before supper 7)  Lantus 100 Unit/ml Soln (Insulin glargine) .... Use 65 units at bedtime 8)  Calcitriol 0.25 Mcg Caps (Calcitriol) .... Take 1 by mouth qd 9)  Lipitor 10 Mg Tabs (Atorvastatin calcium) .... Take1 by mouth once daily 10)  Colchicine 0.6 Mg Tabs (Colchicine) .... Take 1 by mouth qd 11)  Bd Insulin Syringe Microfine 28g X 1/2" 0.5 Ml Misc (Insulin syringe-needle u-100) .... Use three times a day as directed 12)  Allopurinol 300 Mg Tabs (Allopurinol) .... 1/2 by mouth once daily 13)  Metoclopramide Hcl 5 Mg Tabs (Metoclopramide hcl) .Marland Kitchen.. 1 three times a day before meals. do not take 48 hrs prior to gastric emptying study 14)  Elavil 25 Mg  .... Take 1 by mouth once daily 15)  Coreg 25 Mg Tabs (Carvedilol) .Marland Kitchen.. 1 twice a day 16)  Norvasc 10 Mg Tabs (Amlodipine besylate) .Marland Kitchen.. 1 daily 17)  Doxazosin Mesylate 4 Mg Tabs (Doxazosin mesylate) .... Take 1 tablet by mouth at bedtime 18)  Aspirin 325 Mg Tabs (Aspirin) .Marland Kitchen.. 1 once daily 19)  Onetouch Test Strp (Glucose blood) .... Test four  times a day as needed dx 250.00  Patient Instructions: 1)  diabetes- very good readings, best in a while. The AM readings are just a little low. Plan - reduce Lantus to 65 units at bedtime. Continue everything else the same. 2)  Cholesterol is very good: 169. the LDL is 21 - very good. 3)  Kidney function - per Dr. Roanna Banning note is doing well.

## 2010-08-18 NOTE — Progress Notes (Signed)
  Phone Note Refill Request Message from:  Fax from Pharmacy on July 25, 2009 4:28 PM  Refills Requested: Medication #1:  KLOR-CON M20 20 MEQ  TBCR take 1 two times a day by mouth qd Initial call taken by: Ami Bullins CMA,  July 25, 2009 4:29 PM    Prescriptions: KLOR-CON M20 20 MEQ  TBCR (POTASSIUM CHLORIDE CRYS CR) take 1 two times a day by mouth qd  #60 x 3   Entered by:   Ami Bullins CMA   Authorized by:   Jacques Navy MD   Signed by:   Bill Salinas CMA on 07/25/2009   Method used:   Faxed to ...       [facility] (retail)       [address]       [place], [place]  [address]       Ph: [phone]       Fax: [phone]   RxID:   [id number]

## 2010-08-18 NOTE — Assessment & Plan Note (Signed)
Summary: PER WIFE OV  STC   Vital Signs:  Patient profile:   77 year old male Height:      70 inches Weight:      240 pounds BMI:     34.56 O2 Sat:      98 % on Room air Temp:     98.4 degrees F oral Pulse rate:   69 / minute BP sitting:   126 / 60  (left arm) Cuff size:   large  Vitals Entered By: Bill Salinas CMA (May 15, 2010 10:56 AM)  O2 Flow:  Room air CC: follow-up visit/ ab   Primary Care Provider:  Jacques Navy MD  CC:  follow-up visit/ ab.  History of Present Illness: Patient presents for follow-up of BP and diabetes. He has been adhering to his insulin regimen. His home CBG record reveals post-prandial readings in the 200-250 range. Fasting CBG is under 150. He has not had any hypoglycemic episodes. BP has been doing well.   Patient is current with nephrology. He has been informed that he is at risk for needing dialysis in the future. Labs from [facility] reviewed.   Current Medications (verified): 1)  Monopril 40 Mg  Tabs (Fosinopril Sodium) .... Take 1 By Mouth Qd 2)  [employer] Ec Aspirin 325 Mg  Tbec (Aspirin) .... Take 2 By Mouth Once Daily 3)  Clonidine Hcl 0.3 Mg  Tabs (Clonidine Hcl) .... Take 1 Three Times A Day By Mouth Qd 4)  Lasix 40 Mg Tabs (Furosemide) .... 3 By Mouth Once Daily 5)  Klor-Con M20 20 Meq  Tbcr (Potassium Chloride Crys Cr) .... Take 1 Two Times A Day By Mouth Qd 6)  Humalog 100 Unit/ml  Soln (Insulin Lispro (Human)) .... 40 Units Before Breakfast and Lunch,  45 Units Before Supper 7)  Lantus 100 Unit/ml  Soln (Insulin Glargine) .... Use 65 Units At Bedtime 8)  Calcitriol 0.25 Mcg  Caps (Calcitriol) .... Take 1 By Mouth Qd 9)  Lipitor 10 Mg  Tabs (Atorvastatin Calcium) .... Take1 By Mouth Once Daily 10)  Colcrys 0.6 Mg Tabs (Colchicine) .... 2 Tabs Now Then 2 Tabs Daily While Having Gout Pain 11)  Bd Insulin Syringe Microfine 28g X 1/2" 0.5 Ml  Misc (Insulin Syringe-Needle U-100) .... Use Three Times A Day As Directed 12)   Allopurinol 300 Mg Tabs (Allopurinol) .... 1/2 By Mouth Once Daily 13)  Metoclopramide Hcl 5 Mg Tabs (Metoclopramide Hcl) .Marland Kitchen.. 1 Three Times A Day Before Meals. Do Not Take 48 Hrs Prior To Gastric Emptying Study 14)  Elavil  25 Mg .... Take 1 By Mouth Once Daily 15)  Coreg 25 Mg Tabs (Carvedilol) .Marland Kitchen.. 1 Twice A Day 16)  Norvasc 10 Mg Tabs (Amlodipine Besylate) .Marland Kitchen.. 1 Daily 17)  Doxazosin Mesylate 4 Mg Tabs (Doxazosin Mesylate) .... Take 1 Tablet By Mouth At Bedtime 18)  Aspirin 325 Mg Tabs (Aspirin) .Marland Kitchen.. 1 Once Daily 19)  Onetouch Test  Strp (Glucose Blood) .... Test Four Times A Day As Needed Dx 250.00  Allergies (verified): No Known Drug Allergies  Past History:  Past Medical History: Last updated: 02/14/2009 1. Gout 2. Hypertension 3. Renal insufficiency: CKD stage III, last creatinine 6/10 was 3 4. Impotence. 5. Smoker (Quit 1988) 6. Diabetes mellitus, type II 7. h/o CVA 8. s/p lumbar laminectomy 9. depression 10. Hyperlipidemia 11. Osteoarthritis 12. Adenosine myoview (7/10): EF 56%. Fixed inferior defect that represents either old infarction or attenuation (may be attenuation in setting of  normal inferior wall motion).  No ischemia.  Low risk study.   Past Surgical History: Last updated: 01/28/2007 Back Surgery Small lesion excised from back of right hand Stress Cardiolite (08/13/1999) Lower Artial Doppler (02/13/2004) EKG (08/04/1999) FH reviewed for relevance, SH/Risk Factors reviewed for relevance  Review of Systems  The patient denies anorexia, fever, weight loss, decreased hearing, chest pain, dyspnea on exertion, prolonged cough, abdominal pain, severe indigestion/heartburn, muscle weakness, difficulty walking, abnormal bleeding, and enlarged lymph nodes.    Physical Exam  General:  Heavyset AA male who looks younger than his stated age.  Head:  normocephalic and atraumatic.   Eyes:  sclera muddy. PERRLA Neck:  supple.   Lungs:  normal respiratory effort and  normal breath sounds.   Heart:  normal rate and regular rhythm.   Msk:  normal ROM, no joint tenderness, and no joint warmth.   Pulses:  2+ radial Neurologic:  alert & oriented X3, cranial nerves II-XII intact, gait normal, and DTRs symmetrical and normal.   Skin:  turgor normal and color normal.   Psych:  Oriented X3, normally interactive, and good eye contact.     Impression & Recommendations:  Problem # 1:  DIABETES MELLITUS, TYPE II (ICD-250.00) Better control.  Plan continue present regimen        requesting A1C with next blood draw at nephrology.   His updated medication list for this problem includes:    Monopril 40 Mg Tabs (Fosinopril sodium) .Marland Kitchen... Take 1 by mouth qd    [employer] Ec Aspirin 325 Mg Tbec (Aspirin) .Marland Kitchen... Take 2 by mouth once daily    Humalog 100 Unit/ml Soln (Insulin lispro (human)) .Marland KitchenMarland KitchenMarland KitchenMarland Kitchen 40 units before breakfast and lunch,  45 units before supper    Lantus 100 Unit/ml Soln (Insulin glargine) ..... Use 65 units at bedtime    Aspirin 325 Mg Tabs (Aspirin) .Marland Kitchen... 1 once daily  Problem # 2:  ACUTE GOUTY ARTHROPATHY (ICD-274.01) No recent flares  His updated medication list for this problem includes:    Colcrys 0.6 Mg Tabs (Colchicine) .Marland Kitchen... 2 tabs now then 2 tabs daily while having gout pain    Allopurinol 300 Mg Tabs (Allopurinol) .Marland Kitchen... 1/2 by mouth once daily  Problem # 3:  HYPERTENSION (ICD-401.9)  His updated medication list for this problem includes:    Monopril 40 Mg Tabs (Fosinopril sodium) .Marland Kitchen... Take 1 by mouth qd    Clonidine Hcl 0.3 Mg Tabs (Clonidine hcl) .Marland Kitchen... Take 1 three times a day by mouth qd    Lasix 40 Mg Tabs (Furosemide) .Marland KitchenMarland KitchenMarland KitchenMarland Kitchen 3 by mouth once daily    Coreg 25 Mg Tabs (Carvedilol) .Marland Kitchen... 1 twice a day    Norvasc 10 Mg Tabs (Amlodipine besylate) .Marland Kitchen... 1 daily    Doxazosin Mesylate 4 Mg Tabs (Doxazosin mesylate) .Marland Kitchen... Take 1 tablet by mouth at bedtime  BP today: 126/60 Prior BP: 120/58 (12/12/2009)  Adequate control on present  regimen.  Complete Medication List: 1)  Monopril 40 Mg Tabs (Fosinopril sodium) .... Take 1 by mouth qd 2)  [employer] Ec Aspirin 325 Mg Tbec (Aspirin) .... Take 2 by mouth once daily 3)  Clonidine Hcl 0.3 Mg Tabs (Clonidine hcl) .... Take 1 three times a day by mouth qd 4)  Lasix 40 Mg Tabs (Furosemide) .... 3 by mouth once daily 5)  Klor-con M20 20 Meq Tbcr (Potassium chloride crys cr) .... Take 1 two times a day by mouth qd 6)  Humalog 100 Unit/ml Soln (Insulin lispro (human)) .... 40  units before breakfast and lunch,  45 units before supper 7)  Lantus 100 Unit/ml Soln (Insulin glargine) .... Use 65 units at bedtime 8)  Calcitriol 0.25 Mcg Caps (Calcitriol) .... Take 1 by mouth qd 9)  Lipitor 10 Mg Tabs (Atorvastatin calcium) .... Take1 by mouth once daily 10)  Colcrys 0.6 Mg Tabs (Colchicine) .... 2 tabs now then 2 tabs daily while having gout pain 11)  Bd Insulin Syringe Microfine 28g X 1/2" 0.5 Ml Misc (Insulin syringe-needle u-100) .... Use three times a day as directed 12)  Allopurinol 300 Mg Tabs (Allopurinol) .... 1/2 by mouth once daily 13)  Metoclopramide Hcl 5 Mg Tabs (Metoclopramide hcl) .Marland Kitchen.. 1 three times a day before meals. do not take 48 hrs prior to gastric emptying study 14)  Elavil 25 Mg  .... Take 1 by mouth once daily 15)  Coreg 25 Mg Tabs (Carvedilol) .Marland Kitchen.. 1 twice a day 16)  Norvasc 10 Mg Tabs (Amlodipine besylate) .Marland Kitchen.. 1 daily 17)  Doxazosin Mesylate 4 Mg Tabs (Doxazosin mesylate) .... Take 1 tablet by mouth at bedtime 18)  Aspirin 325 Mg Tabs (Aspirin) .Marland Kitchen.. 1 once daily 19)  Onetouch Test Strp (Glucose blood) .... Test four times a day as needed dx 250.00  Other Orders: TD Toxoids IM 7 YR + ([address]) Pneumococcal Vaccine (60454) Admin 1st Vaccine (09811) Admin of Any Addtl Vaccine (91478)   Orders Added: 1)  TD Toxoids IM 7 YR + [90714] 2)  Pneumococcal Vaccine [90732] 3)  Admin 1st Vaccine [90471] 4)  Admin of Any Addtl Vaccine [90472] 5)  Est. Patient Level  III [29562]   Immunization History:  Influenza Immunization History:    Influenza:  historical (04/22/2010)  Immunizations Administered:  Tetanus Vaccine:    Vaccine Type: Td    Site: right deltoid    Mfr: Sanofi Pasteur    Dose: 0.5 ml    Route: IM    Given by: Rock Nephew CMA    Exp. Date: 08/20/2011    Lot #: [id number]    VIS given: 06/05/08 version given May 15, 2010.  Pneumonia Vaccine:    Vaccine Type: Pneumovax    Site: left deltoid    Mfr: Merck    Dose: 0.5 ml    Route: IM    Given by: Rock Nephew CMA    Exp. Date: 10/05/2011    Lot #: [id number]    VIS given: 06/23/09 version given May 15, 2010.   Immunization History:  Influenza Immunization History:    Influenza:  Historical (04/22/2010)  Immunizations Administered:  Tetanus Vaccine:    Vaccine Type: Td    Site: right deltoid    Mfr: Sanofi Pasteur    Dose: 0.5 ml    Route: IM    Given by: Rock Nephew CMA    Exp. Date: 08/20/2011    Lot #: [id number]    VIS given: 06/05/08 version given May 15, 2010.  Pneumonia Vaccine:    Vaccine Type: Pneumovax    Site: left deltoid    Mfr: Merck    Dose: 0.5 ml    Route: IM    Given by: Rock Nephew CMA    Exp. Date: 10/05/2011    Lot #: [id number]    VIS given: 06/23/09 version given May 15, 2010.

## 2010-08-18 NOTE — Medication Information (Signed)
Summary: [facility]  [facility]   Imported By: Sherian Rein 10/02/2009 12:06:54  _____________________________________________________________________  External Attachment:    Type:   Image     Comment:   External Document

## 2010-08-18 NOTE — Progress Notes (Signed)
Summary: Gout?  Phone Note Call from Patient   Summary of Call: Pt c/o gout in his elbow and wants to know what MD suggests.  Initial call taken by: Lamar Sprinkles, CMA,  March 17, 2010 2:05 PM  Follow-up for Phone Call        continue allopurinol, increase colchicine to 2 tabs now and then two tabs a day while having pain; indomethacin 25mg  three times a day for 5 days maximum, #15 Follow-up by: Jacques Navy MD,  March 17, 2010 6:01 PM  Additional Follow-up for Phone Call Additional follow up Details #1::        Pt does not have colchicine, only comes in brand name now. Ok rx for #6 b/c of cost. Pt informed  Additional Follow-up by: Lamar Sprinkles, CMA,  March 17, 2010 6:08 PM    New/Updated Medications: COLCRYS 0.6 MG TABS (COLCHICINE) 2 tabs now then 2 tabs daily while having gout pain INDOMETHACIN 25 MG CAPS (INDOMETHACIN) 1 three times a day for max of 5 day for gout pain Prescriptions: COLCRYS 0.6 MG TABS (COLCHICINE) 2 tabs now then 2 tabs daily while having gout pain  #6 x 1   Entered by:   Lamar Sprinkles, CMA   Authorized by:   Jacques Navy MD   Signed by:   Lamar Sprinkles, CMA on 03/17/2010   Method used:   Faxed to ...       [facility] (retail)       [address]       [place], [place]  [address]       Ph: [phone]       Fax: [phone]   RxID:   [id number] INDOMETHACIN 25 MG CAPS (INDOMETHACIN) 1 three times a day for max of 5 day for gout pain  #15 x 0   Entered by:   Lamar Sprinkles, CMA   Authorized by:   Jacques Navy MD   Signed by:   Lamar Sprinkles, CMA on 03/17/2010   Method used:   Faxed to ...       [facility] (retail)       [address]       [place], [place]  [address]       Ph: [phone]       Fax: [phone]   RxID:   [id number] COLCRYS 0.6 MG TABS (COLCHICINE) 2 tabs now then 2 tabs daily while having gout pain  #6 x 1   Entered by:   Lamar Sprinkles, CMA  Authorized by:   Jacques Navy MD   Signed by:   Lamar Sprinkles, CMA on 03/17/2010   Method used:   Print then Give to Patient   RxID:   [id number]

## 2010-08-18 NOTE — Progress Notes (Signed)
  Phone Note Refill Request Message from:  Fax from Pharmacy on April 02, 2010 11:24 AM  Refills Requested: Medication #1:  LANTUS 100 UNIT/ML  SOLN use 65 units at bedtime Initial call taken by: Ami Bullins CMA,  April 02, 2010 11:24 AM    Prescriptions: LANTUS 100 UNIT/ML  SOLN (INSULIN GLARGINE) use 65 units at bedtime  #1 month supp x 4   Entered by:   Ami Bullins CMA   Authorized by:   Jacques Navy MD   Signed by:   Bill Salinas CMA on 04/02/2010   Method used:   Faxed to ...       [facility] (retail)       [address]       [place], [place]  [address]       Ph: [phone]       Fax: [phone]   RxID:   [id number]

## 2010-08-18 NOTE — Progress Notes (Signed)
  Phone Note Refill Request Message from:  Fax from Pharmacy on March 05, 2010 2:16 PM  Refills Requested: Medication #1:  HUMALOG 100 UNIT/ML  SOLN 40 units before breakfast and lunch Initial call taken by: Ami Bullins CMA,  March 05, 2010 2:16 PM    Prescriptions: HUMALOG 100 UNIT/ML  SOLN (INSULIN LISPRO (HUMAN)) 40 units before breakfast and lunch,  45 units before supper  #1 mth x 6   Entered by:   Ami Bullins CMA   Authorized by:   Jacques Navy MD   Signed by:   Bill Salinas CMA on 03/05/2010   Method used:   Faxed to ...       [facility] (retail)       [address]       [place], [place]  [address]       Ph: [phone]       Fax: [phone]   RxID:   [id number]

## 2010-08-18 NOTE — Progress Notes (Signed)
  Phone Note Refill Request Message from:  Fax from Pharmacy on May 07, 2010 4:14 PM  Refills Requested: Medication #1:  NORVASC 10 MG TABS 1 daily   Last Refilled: 03/28/2010 Initial call taken by: Rock Nephew CMA,  May 07, 2010 4:14 PM    Prescriptions: NORVASC 10 MG TABS (AMLODIPINE BESYLATE) 1 daily  #30 x 6   Entered by:   Rock Nephew CMA   Authorized by:   Jacques Navy MD   Signed by:   Rock Nephew CMA on 05/07/2010   Method used:   Faxed to ...       [facility] (retail)       [address]       [place], [place]  [address]       Ph: [phone]       Fax: [phone]   RxID:   [id number]

## 2010-08-18 NOTE — Progress Notes (Signed)
  Phone Note Refill Request Message from:  Fax from Pharmacy on Dec 04, 2009 4:01 PM  Refills Requested: Medication #1:  MONOPRIL 40 MG  TABS take 1 by mouth qd Initial call taken by: Ami Bullins CMA,  Dec 04, 2009 4:01 PM    Prescriptions: MONOPRIL 40 MG  TABS (FOSINOPRIL SODIUM) take 1 by mouth qd  #30 x 6   Entered by:   Ami Bullins CMA   Authorized by:   Jacques Navy MD   Signed by:   Bill Salinas CMA on 12/04/2009   Method used:   Faxed to ...       [facility] (retail)       [address]       [place], [place]  [address]       Ph: [phone]       Fax: [phone]   RxID:   [id number]

## 2010-08-20 NOTE — Progress Notes (Signed)
Summary: med for gout  Phone Note Call from Patient Call back at Home Phone [phone]   Caller: Patient Call For: Jacques Navy MD Summary of Call: Pt left message on triage A,she has Gout in right elbow and it is very painful. Pt requesting medicine be sent to pharmacy for this. Please advise. Initial call taken by: Verdell Face,  July 31, 2010 10:00 AM  Follow-up for Phone Call        may refill colcrys that is on the medlist for an acute flare of gout. Follow-up by: Jacques Navy MD,  July 31, 2010 10:57 AM  Additional Follow-up for Phone Call Additional follow up Details #1::        rx sent to [facility] pharmacy, pt informed. Additional Follow-up by: Brenton Grills CMA Duncan Dull),  July 31, 2010 11:00 AM    Prescriptions: COLCRYS 0.6 MG TABS (COLCHICINE) 2 tabs now then 2 tabs daily while having gout pain  #6 x 1   Entered by:   Brenton Grills CMA (AAMA)   Authorized by:   Jacques Navy MD   Signed by:   Brenton Grills CMA (AAMA) on 07/31/2010   Method used:   Electronically to        [facility] (retail)       [address]       [place], [place]  [address]       Ph: [phone]       Fax: [phone]   RxID:   [id number]

## 2010-08-20 NOTE — Progress Notes (Signed)
Summary: Cold  Phone Note Call from Patient Call back at [facility] Phone [phone]   Summary of Call: Pt c/o chest cold x 2 and 1/2 wks. C/o non productive cough, sinus congestion and clear drainage. No fever. He has tried otc robitussin w/little relief. Cough is worse at night.  Initial call taken by: Lamar Sprinkles, CMA,  July 01, 2010 1:17 PM  Follow-up for Phone Call        ok for promethazine with codein 1 tsp q 6 8 oz.  Follow-up by: Jacques Navy MD,  July 01, 2010 1:40 PM  Additional Follow-up for Phone Call Additional follow up Details #1::        Pt informed  Additional Follow-up by: Lamar Sprinkles, CMA,  July 01, 2010 4:03 PM    New/Updated Medications: PROMETHAZINE-CODEINE 6.25-10 MG/5ML SYRP (PROMETHAZINE-CODEINE) 1 tsp every 6 hours as needed cough Prescriptions: PROMETHAZINE-CODEINE 6.25-10 MG/5ML SYRP (PROMETHAZINE-CODEINE) 1 tsp every 6 hours as needed cough  #8 x 0   Entered by:   Lamar Sprinkles, CMA   Authorized by:   Jacques Navy MD   Signed by:   Lamar Sprinkles, CMA on 07/01/2010   Method used:   Telephoned to ...       [facility] (retail)       [address]       [place], [place]  [address]       Ph: [phone]       Fax: [phone]   RxID:   [id number]

## 2010-08-20 NOTE — Assessment & Plan Note (Signed)
Summary: cold sx/abd pain/cd   Vital Signs:  Patient profile:   77 year old male Height:      70 inches Weight:      238 pounds BMI:     34.27 O2 Sat:      97 % on Room air Temp:     98.1 degrees F oral Pulse rate:   67 / minute BP sitting:   138 / 72  (left arm) Cuff size:   large  Vitals Entered By: Bill Salinas CMA (July 23, 2010 9:36 AM)  O2 Flow:  Room air  CC: pt here with c/o right side pain since monday. Pt describes as constant pain/ ab/ ab   Primary Care Provider:  Jacques Navy MD  CC:  pt here with c/o right side pain since monday. Pt describes as constant pain/ ab/ ab.  History of Present Illness: Patient presents due to pain in the  RLQ abdomen since Saturday. He denies any trauma or strain. The pain is persistent, exacerbated with movement and deep inspiration. He rates this as mild to moderate. He has had no F/S/C, change in bowel habit, borborygmi or bloating. Liquid antacids have no thelped.   Current Medications (verified): 1)  Monopril 40 Mg  Tabs (Fosinopril Sodium) .... Take 1 By Mouth Qd 2)  Albertsons Ec Aspirin 325 Mg  Tbec (Aspirin) .... Take 2 By Mouth Once Daily 3)  Clonidine Hcl 0.3 Mg  Tabs (Clonidine Hcl) .... Take 1 Three Times A Day By Mouth Qd 4)  Lasix 40 Mg Tabs (Furosemide) .... 3 By Mouth Once Daily 5)  Klor-Con M20 20 Meq  Tbcr (Potassium Chloride Crys Cr) .... Take 1 Two Times A Day By Mouth Qd 6)  Humalog 100 Unit/ml  Soln (Insulin Lispro (Human)) .... 40 Units Before Breakfast and Lunch,  45 Units Before Supper 7)  Lantus 100 Unit/ml  Soln (Insulin Glargine) .... Use 65 Units At Bedtime 8)  Calcitriol 0.25 Mcg  Caps (Calcitriol) .... Take 1 By Mouth Qd 9)  Lipitor 10 Mg  Tabs (Atorvastatin Calcium) .... Take1 By Mouth Once Daily 10)  Colcrys 0.6 Mg Tabs (Colchicine) .... 2 Tabs Now Then 2 Tabs Daily While Having Gout Pain 11)  Bd Insulin Syringe Microfine 28g X 1/2" 0.5 Ml  Misc (Insulin Syringe-Needle U-100) .... Use Three Times  A Day As Directed 12)  Allopurinol 300 Mg Tabs (Allopurinol) .... 1/2 By Mouth Once Daily 13)  Metoclopramide Hcl 5 Mg Tabs (Metoclopramide Hcl) .Marland Kitchen.. 1 Three Times A Day Before Meals. Do Not Take 48 Hrs Prior To Gastric Emptying Study 14)  Elavil  25 Mg .... Take 1 By Mouth Once Daily 15)  Coreg 25 Mg Tabs (Carvedilol) .Marland Kitchen.. 1 Twice A Day 16)  Norvasc 10 Mg Tabs (Amlodipine Besylate) .Marland Kitchen.. 1 Daily 17)  Doxazosin Mesylate 4 Mg Tabs (Doxazosin Mesylate) .... Take 1 Tablet By Mouth At Bedtime 18)  Aspirin 325 Mg Tabs (Aspirin) .Marland Kitchen.. 1 Once Daily 19)  Onetouch Test  Strp (Glucose Blood) .... Test Four Times A Day As Needed Dx 250.00 20)  Promethazine-Codeine 6.25-10 Mg/61ml Syrp (Promethazine-Codeine) .Marland Kitchen.. 1 Tsp Every 6 Hours As Needed Cough  Allergies (verified): No Known Drug Allergies  Past History:  Past Medical History: Last updated: 02/14/2009 1. Gout 2. Hypertension 3. Renal insufficiency: CKD stage III, last creatinine 6/10 was 3 4. Impotence. 5. Smoker (Quit 1988) 6. Diabetes mellitus, type II 7. h/o CVA 8. s/p lumbar laminectomy 9. depression 10. Hyperlipidemia 11.  Osteoarthritis 12. Adenosine myoview (7/10): EF 56%. Fixed inferior defect that represents either old infarction or attenuation (may be attenuation in setting of normal inferior wall motion).  No ischemia.  Low risk study.   Past Surgical History: Last updated: 01/28/2007 Back Surgery Small lesion excised from back of right hand Stress Cardiolite (08/13/1999) Lower Artial Doppler (02/13/2004) EKG (08/04/1999) FH reviewed for relevance, SH/Risk Factors reviewed for relevance  Review of Systems       The patient complains of abdominal pain.  The patient denies anorexia, fever, weight loss, weight gain, chest pain, dyspnea on exertion, melena, hematochezia, severe indigestion/heartburn, muscle weakness, difficulty walking, abnormal bleeding, and enlarged lymph nodes.    Physical Exam  General:  alert,  well-developed, well-nourished, and well-hydrated.   Head:  normocephalic and atraumatic.   Eyes:  C&S clear Neck:  supple.   Lungs:  normal respiratory effort, normal breath sounds, no crackles, and no wheezes.   Heart:  normal rate, regular rhythm, no gallop, and no JVD.   Abdomen:  obese, soft, normal bowel sounds, no distention, no guarding, and no rigidity.   Skin:  mild brusing right lower quadrant abdomen with a 1 cm palpable nodule in the center of the bruise. No fluctuance. No palpable defect in the abdominal musculature Psych:  Oriented X3, good eye contact, and not anxious appearing.     Impression & Recommendations:  Problem # 1:  LOCALIZED SUPERFICIAL SWELLING MASS OR LUMP (ICD-782.2) Small nodule/lmg RLQ abdomen that is not suggestive of hernia. suspect traumatic hematoma vs injection granuloma.  Plan - warm compresses for comfort           observe for enlargement, increasing tenderness or inflammation  - return for re-evaluation.  Problem # 2:  DIABETES MELLITUS, TYPE II (ICD-250.00)  Blood sugar record from home reviewed: fasting CBGs in the 120-150 range; post-prandial with a lot of variability low of 103 to high of 295. Last A1C April 28, '11 7.3%  Lst lipid panel Oct '11 Chol 100, HDL 28, LDL 24  Plan - continue present regimen.  His updated medication list for this problem includes:    Monopril 40 Mg Tabs (Fosinopril sodium) .Marland Kitchen... Take 1 by mouth qd    Albertsons Ec Aspirin 325 Mg Tbec (Aspirin) .Marland Kitchen... Take 2 by mouth once daily    Humalog 100 Unit/ml Soln (Insulin lispro (human)) .Marland KitchenMarland KitchenMarland KitchenMarland Kitchen 40 units before breakfast and lunch,  45 units before supper    Lantus 100 Unit/ml Soln (Insulin glargine) ..... Use 65 units at bedtime    Aspirin 325 Mg Tabs (Aspirin) .Marland Kitchen... 1 once daily  Labs Reviewed: Creat: 3.08 (12/28/2008)     Last Eye Exam: normal (01/06/2009) Reviewed HgBA1c results: 8.4 (01/17/2008)  8.1 (10/12/2007)  Complete Medication List: 1)  Monopril 40 Mg  Tabs (Fosinopril sodium) .... Take 1 by mouth qd 2)  Albertsons Ec Aspirin 325 Mg Tbec (Aspirin) .... Take 2 by mouth once daily 3)  Clonidine Hcl 0.3 Mg Tabs (Clonidine hcl) .... Take 1 three times a day by mouth qd 4)  Lasix 40 Mg Tabs (Furosemide) .... 3 by mouth once daily 5)  Klor-con M20 20 Meq Tbcr (Potassium chloride crys cr) .... Take 1 two times a day by mouth qd 6)  Humalog 100 Unit/ml Soln (Insulin lispro (human)) .... 40 units before breakfast and lunch,  45 units before supper 7)  Lantus 100 Unit/ml Soln (Insulin glargine) .... Use 65 units at bedtime 8)  Calcitriol 0.25 Mcg Caps (Calcitriol) .... Take  1 by mouth qd 9)  Lipitor 10 Mg Tabs (Atorvastatin calcium) .... Take1 by mouth once daily 10)  Colcrys 0.6 Mg Tabs (Colchicine) .... 2 tabs now then 2 tabs daily while having gout pain 11)  Bd Insulin Syringe Microfine 28g X 1/2" 0.5 Ml Misc (Insulin syringe-needle u-100) .... Use three times a day as directed 12)  Allopurinol 300 Mg Tabs (Allopurinol) .... 1/2 by mouth once daily 13)  Metoclopramide Hcl 5 Mg Tabs (Metoclopramide hcl) .Marland Kitchen.. 1 three times a day before meals. do not take 48 hrs prior to gastric emptying study 14)  Elavil 25 Mg  .... Take 1 by mouth once daily 15)  Coreg 25 Mg Tabs (Carvedilol) .Marland Kitchen.. 1 twice a day 16)  Norvasc 10 Mg Tabs (Amlodipine besylate) .Marland Kitchen.. 1 daily 17)  Doxazosin Mesylate 4 Mg Tabs (Doxazosin mesylate) .... Take 1 tablet by mouth at bedtime 18)  Aspirin 325 Mg Tabs (Aspirin) .Marland Kitchen.. 1 once daily 19)  Onetouch Test Strp (Glucose blood) .... Test four times a day as needed dx 250.00 20)  Promethazine-codeine 6.25-10 Mg/77ml Syrp (Promethazine-codeine) .Marland Kitchen.. 1 tsp every 6 hours as needed cough   Orders Added: 1)  Est. Patient Level III [[address]]

## 2010-08-20 NOTE — Progress Notes (Signed)
Summary: RF  Phone Note Call from Patient Call back at Home Phone [phone]   Summary of Call: Pt's wife called & req rx refill for fosipril? unsure what this is, no req from pharmacy. Need to call pt's wife for more details.  Initial call taken by: Lamar Sprinkles, CMA,  July 08, 2010 1:13 PM    Prescriptions: MONOPRIL 40 MG  TABS (FOSINOPRIL SODIUM) take 1 by mouth qd  #90 x 1   Entered by:   Lamar Sprinkles, CMA   Authorized by:   Jacques Navy MD   Signed by:   Lamar Sprinkles, CMA on 07/08/2010   Method used:   Faxed to ...       [facility] (retail)       [address]       [place], [place]  [address]       Ph: [phone]       Fax: [phone]   RxID:   [id number]

## 2010-09-04 ENCOUNTER — Encounter: Payer: Self-pay | Admitting: Internal Medicine

## 2010-09-21 ENCOUNTER — Telehealth: Payer: Self-pay | Admitting: Internal Medicine

## 2010-09-29 NOTE — Progress Notes (Signed)
  Phone Note Refill Request Message from:  Fax from Pharmacy on September 21, 2010 3:19 PM  Refills Requested: Medication #1:  KLOR-CON M20 20 MEQ  TBCR take 1 two times a day by mouth qd Initial call taken by: Ami Bullins CMA,  September 21, 2010 3:19 PM    Prescriptions: KLOR-CON M20 20 MEQ  TBCR (POTASSIUM CHLORIDE CRYS CR) take 1 two times a day by mouth qd  #180 x 3   Entered by:   Ami Bullins CMA   Authorized by:   Jacques Navy MD   Signed by:   Bill Salinas CMA on 09/21/2010   Method used:   Electronically to        [facility] (retail)       [address]       [place], [place]  [address]       Ph: [phone]       Fax: [phone]   RxID:   [id number]

## 2010-09-29 NOTE — Letter (Signed)
Summary: [facility]   Imported By: Sherian Rein 09/22/2010 14:36:19  _____________________________________________________________________  External Attachment:    Type:   Image     Comment:   External Document

## 2010-10-01 ENCOUNTER — Telehealth: Payer: Self-pay | Admitting: Internal Medicine

## 2010-10-06 NOTE — Progress Notes (Signed)
  Phone Note Refill Request Message from:  Fax from Pharmacy on October 01, 2010 11:46 AM  Refills Requested: Medication #1:  LIPITOR 10 MG  TABS TAKE1 by mouth once daily Initial call taken by: Ami Bullins CMA,  October 01, 2010 11:47 AM    Prescriptions: LIPITOR 10 MG  TABS (ATORVASTATIN CALCIUM) TAKE1 by mouth once daily  #90 x 2   Entered by:   Ami Bullins CMA   Authorized by:   Jacques Navy MD   Signed by:   Bill Salinas CMA on 10/01/2010   Method used:   Electronically to        [facility] (retail)       [address]       [place], [place]  [address]       Ph: [phone]       Fax: [phone]   RxID:   [id number]

## 2010-10-26 ENCOUNTER — Other Ambulatory Visit: Payer: Self-pay | Admitting: Internal Medicine

## 2010-11-23 ENCOUNTER — Other Ambulatory Visit: Payer: Self-pay | Admitting: Internal Medicine

## 2010-12-01 NOTE — Letter (Signed)
February 09, 2007    Kristine Garbe. Ezzard Standing, M.D.  [address], [place] [address]   RE:  GARNIE, BORCHARDT  MRN:  [id number]  /  DOB:  05-24-1929   Dear Thayer Ohm:   Thank you very much for seeing Casey Grimes, a very delightful 77-  year-old gentleman for a non-healing ulcer at the right buccal membrane  just at the area of the right molar.   Mr. Swigert is a gentleman followed for diabetes, hypertension and renal  insufficiency who was seen in the office January 16, 2007 with what I  thought was a simple aphthous ulcer. He was treated with nystatin Magic  Mouth wash with Xylocaine.  Over the succeeding weeks he has had no  relief from his pain and discomfort.  He presented to the office today  for reevaluation.   On examination the patient's vital signs are stable.  Oral examination  revealed the patient to have a deep ulcer on the buccal membrane by the  molar with surrounding area of swelling and a whitish type tissue.  My  concern is that this is a malignant lesion and would appreciate your  assessment of this.   Please find enclosed a copy of my most recent H&P on Mr. Utz from  September 23, 2005, which details his past medical history.  His medications  are listed in that note and remain accurate.  The only addition will be  that the patient was started on Xylocaine ointment for comfort,  particularly before meals.   If I can provide any additional information, please do not hesitate to  contact me.   As always, I appreciate your assistance in the evaluation of my patients  and look forward to hearing from you.  I remain yours truly.    Sincerely,      Rosalyn Gess. Norins, MD  Electronically Signed    MEN/MedQ  DD: 02/09/2007  DT: 02/10/2007  Job #: [id number]

## 2010-12-01 NOTE — Op Note (Signed)
NAMEJAEVEN, [name]                ACCOUNT NO.:  [id number]   MEDICAL RECORD NO.:  [id number]          PATIENT TYPE:  AMB   LOCATION:  [facility]                          FACILITY:  [facility]   PHYSICIAN:  Christopher E. 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   OPERATION:  Excisional biopsy of right buccal mucosal lesion. (2 x 3  cm.)   SURGEON:  Dr. Narda Bonds.   ANESTHESIA:  Mac local.   COMPLICATIONS:  None.   CLINICAL NOTE:  Casey Grimes is a 77 year old gentleman with a history  of diabetes and hypertension he has had a sore on the right side of his  mouth now for well over a month.  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  He is taken to the operating room at this time for excisional biopsy of  a right buccal mucosal lesion.   DESCRIPTION OF PROCEDURE:  After adequate IV sedation, the right buccal  area was injected with Xylocaine with epinephrine for local anesthetic.  Then using a 15 blade and scissors, the buccal lesion was excised with  clear mucosal margin. The lesion was marked with a single stitch placed  at the 12 o'clock position. Hemostasis was obtained with the cautery and  a wet sponge.  The area was left open, it was not closed, it will heal  in secondarily.  Casey Grimes tolerated this well and is subsequently  transferred to the recovery room postop doing well.   DISPOSITION:  Casey Grimes is discharged home on his regular medications in  addition to amoxicillin 500 mg b.i.d. for 1 week, Tylenol and Vicodin  p.r.n. pain.  We will have him return to my office in 10  days for  recheck and review pathology.           ______________________________  Kristine Garbe Ezzard Standing, M.D.     CEN/MEDQ  D:  02/24/2007  T:  02/24/2007  Job:  [id number]   cc:   Rosalyn Gess. Norins, MD

## 2010-12-22 ENCOUNTER — Other Ambulatory Visit: Payer: Self-pay | Admitting: Cardiology

## 2010-12-23 ENCOUNTER — Other Ambulatory Visit: Payer: Self-pay | Admitting: *Deleted

## 2010-12-23 MED ORDER — FOSINOPRIL SODIUM 40 MG PO TABS: 40.0000 mg | ORAL_TABLET | Freq: Every day | ORAL | Status: DC

## 2010-12-24 ENCOUNTER — Encounter: Payer: Self-pay | Admitting: Internal Medicine

## 2010-12-25 ENCOUNTER — Encounter: Payer: Self-pay | Admitting: Internal Medicine

## 2010-12-25 ENCOUNTER — Ambulatory Visit (INDEPENDENT_AMBULATORY_CARE_PROVIDER_SITE_OTHER): Payer: Medicare Other | Admitting: Internal Medicine

## 2010-12-25 DIAGNOSIS — M109 Gout, unspecified: Secondary | ICD-10-CM

## 2010-12-25 DIAGNOSIS — E119 Type 2 diabetes mellitus without complications: Secondary | ICD-10-CM

## 2010-12-25 DIAGNOSIS — I10 Essential (primary) hypertension: Secondary | ICD-10-CM

## 2010-12-27 NOTE — Progress Notes (Signed)
  Subjective:    Patient ID: Casey Grimes, male    DOB: 1928-11-16, 77 y.o.   MRN: [id number]  HPI Casey Grimes presents for routine follow-up of diabetes and hypertension. His record from home reveals variable CBG values and reasonable BP readings. He is taking all his medications. His wife keeps him on a diabetic diet. He is generally feeling ok.   PMH, FamHx and SocHx reviewed for any changes and relevance.    Review of Systems Review of Systems  Constitutional:  Negative for fever, chills, activity change and unexpected weight change.  HEENT:  Negative for hearing loss, ear pain, congestion, neck stiffness and postnasal drip. Negative for sore throat or swallowing problems. Negative for dental complaints.   Eyes: Negative for vision loss or change in visual acuity.  Respiratory: Negative for chest tightness and wheezing.   Cardiovascular: Negative for chest pain and palpitationNo decreased exercise tolerance Gastrointestinal: No change in bowel habit. No bloating or gas. No reflux or indigestion Genitourinary: Negative for urgency, frequency, flank pain and difficulty urinating.  Musculoskeletal: Negative for myalgias, back pain, arthralgias and gait problem.  Neurological: Negative for dizziness, tremors, weakness and headaches.  Hematological: Negative for adenopathy.  Psychiatric/Behavioral: Negative for behavioral problems and dysphoric mood.       Objective:   Physical Exam Vitals reviewed Gen'l - heavy set older AA man in no distress Pul - no increased work of breathing, normal respirations. Cor - RRR       Assessment & Plan:

## 2010-12-27 NOTE — Assessment & Plan Note (Signed)
He is concerned about recurrent gout flares. He is on allopurinol.  Plan - will check Uric acid level at next lab draw.           He will continue allopurinol at present dose.

## 2010-12-27 NOTE — Assessment & Plan Note (Signed)
Adequate control on present medical regimen. No change at this time.

## 2010-12-27 NOTE — Assessment & Plan Note (Signed)
Last A1C, performed at nephrologist office, revealed adequate control with value less than 8%.  Plan - will have A1C drawn with next lab at nephrology with recommendations to follow.

## 2011-01-23 ENCOUNTER — Other Ambulatory Visit: Payer: Self-pay | Admitting: Cardiology

## 2011-01-25 ENCOUNTER — Other Ambulatory Visit: Payer: Self-pay | Admitting: *Deleted

## 2011-01-25 MED ORDER — CARVEDILOL 25 MG PO TABS: 25.0000 mg | ORAL_TABLET | Freq: Two times a day (BID) | ORAL | Status: DC

## 2011-03-02 ENCOUNTER — Other Ambulatory Visit: Payer: Self-pay | Admitting: *Deleted

## 2011-03-02 MED ORDER — CARVEDILOL 25 MG PO TABS: 25.0000 mg | ORAL_TABLET | Freq: Two times a day (BID) | ORAL | Status: DC

## 2011-03-24 ENCOUNTER — Other Ambulatory Visit: Payer: Self-pay | Admitting: *Deleted

## 2011-03-24 MED ORDER — INSULIN GLARGINE 100 UNIT/ML ~~LOC~~ SOLN: 65.0000 [IU] | Freq: Every day | SUBCUTANEOUS | Status: DC

## 2011-03-26 ENCOUNTER — Other Ambulatory Visit: Payer: Self-pay | Admitting: Internal Medicine

## 2011-03-30 ENCOUNTER — Ambulatory Visit (INDEPENDENT_AMBULATORY_CARE_PROVIDER_SITE_OTHER): Payer: Medicare Other | Admitting: Internal Medicine

## 2011-03-30 ENCOUNTER — Encounter: Payer: Self-pay | Admitting: Internal Medicine

## 2011-03-30 VITALS — BP 110/50 | HR 67 | Temp 98.3°F | Wt 238.0 lb

## 2011-03-30 DIAGNOSIS — Z136 Encounter for screening for cardiovascular disorders: Secondary | ICD-10-CM

## 2011-03-30 DIAGNOSIS — E119 Type 2 diabetes mellitus without complications: Secondary | ICD-10-CM

## 2011-03-30 DIAGNOSIS — M25629 Stiffness of unspecified elbow, not elsewhere classified: Secondary | ICD-10-CM

## 2011-03-30 DIAGNOSIS — N259 Disorder resulting from impaired renal tubular function, unspecified: Secondary | ICD-10-CM

## 2011-03-30 DIAGNOSIS — I10 Essential (primary) hypertension: Secondary | ICD-10-CM

## 2011-03-30 NOTE — Assessment & Plan Note (Signed)
Stable. Followed closely by nephrology service.

## 2011-03-30 NOTE — Assessment & Plan Note (Signed)
Last A1C august 20th - 8.3% up from 7%  He reports no change in his diet and that he is adherent to his medical regimen. CBGs running under 150.   Plan - continue present medications           Continue diabetic diet           Repeat A1C in 3 months

## 2011-03-30 NOTE — Assessment & Plan Note (Signed)
Recurrent pain in the right shoulder with a non-focal exam.  Plan - heat           lineament

## 2011-03-30 NOTE — Assessment & Plan Note (Signed)
BP Readings from Last 3 Encounters:  03/30/11 110/50  12/25/10 138/70  07/23/10 138/72   Good control. No change in medication

## 2011-03-30 NOTE — Progress Notes (Signed)
Subjective:    Patient ID: Casey Grimes, male    DOB: [birth year], 77 y.o.   MRN: [id number]  HPI   The patient is here for annual Medicare wellness examination and management of other chronic and acute problems. He is complaining of right shoulder and arm pain. He does not recall any injury or strain.   The risk factors are reflected in the social history.  The roster of all physicians providing medical care to patient - is listed in the Snapshot section of the chart.  Activities of daily living:  The patient is 100% inedpendent in all ADLs: dressing, toileting, feeding as well as independent mobility  Home safety : The patient has smoke detectors in the home. They wear seatbelts. firearms at home. There is no violence in the home.   There is no risks for hepatitis, STDs or HIV. There is no   history of blood transfusion. They have no travel history to infectious disease endemic areas of the world.  The patient has not seen their dentist in the last six month. They have seen their eye doctor in the last year. They deny  any hearing difficulty and have not had audiologic testing in the last year.  They do not  have excessive sun exposure. Discussed the need for sun protection: hats, long sleeves and use of sunscreen if there is significant sun exposure.   Diet: the importance of a healthy diet is discussed. They do have a healthy diet.  The patient does not have  a regular exercise program.  The benefits of regular aerobic exercise were discussed.  Depression screen: there are no signs or vegative symptoms of depression- irritability, change in appetite, anhedonia, sadness/tearfullness.  Cognitive assessment: the patient manages all their financial and personal affairs and is actively engaged. They could relate day,date,year.  The following portions of the patient's history were reviewed and updated as appropriate: allergies, current medications, past family history, past medical  history,  past surgical history, past social history  and problem list.  Vision, hearing, body mass index were assessed and reviewed.   During the course of the visit the patient was educated and counseled about appropriate screening and preventive services including : fall prevention , diabetes screening, nutrition counseling, colorectal cancer screening, and recommended immunizations.   Review of Systems Review of Systems  Constitutional:  Negative for fever, chills, activity change and unexpected weight change.  HEENT:  Negative for hearing loss, ear pain, congestion, neck stiffness and postnasal drip. Negative for sore throat or swallowing problems. Negative for dental complaints.   Eyes: Negative for vision loss or change in visual acuity.  Respiratory: Negative for chest tightness and wheezing.   Cardiovascular: Negative for chest pain and palpitation. No decreased exercise tolerance Gastrointestinal: No change in bowel habit. No bloating or gas. No reflux or indigestion Genitourinary: Negative for urgency, frequency, flank pain and difficulty urinating.  Musculoskeletal: Negative for myalgias, back pain, arthralgias and gait problem except for Right shoulder and arm pain.  Neurological: Negative for dizziness, tremors, weakness and headaches.  Hematological: Negative for adenopathy.  Psychiatric/Behavioral: Negative for behavioral problems and dysphoric mood.       Objective:   Physical Exam Vitals reviewed - stable Gen'l - older AA man in no distress HEENT - C&S clear, no oral lesions Neck- supple Resp - normal w/o increased WOB Cor - RRR, 2+ radial pulse Abd- BS+, soft, no guarding Genitalia - deferred Ext - right UE with full and normal ROM, nl  strength, no deformity, tender to deep palpation lateral and anterior shoulder. No pedal edema. Slow to stand and slow gait. Able to step up to exam w/o assist. Derm - no lesions head, neck, arms. Old scar right hand dorsum Neuro -  Oriented. Normal facial symmetry and movement, normal motor strength.        Assessment & Plan:

## 2011-04-02 ENCOUNTER — Other Ambulatory Visit: Payer: Self-pay | Admitting: *Deleted

## 2011-04-02 MED ORDER — CARVEDILOL 25 MG PO TABS: 25.0000 mg | ORAL_TABLET | Freq: Two times a day (BID) | ORAL | Status: DC

## 2011-04-02 MED ORDER — INSULIN LISPRO 100 UNIT/ML ~~LOC~~ SOLN: 40.0000 [IU] | Freq: Two times a day (BID) | SUBCUTANEOUS | Status: DC

## 2011-04-02 MED ORDER — POTASSIUM CHLORIDE CRYS ER 20 MEQ PO TBCR: 20.0000 meq | EXTENDED_RELEASE_TABLET | Freq: Two times a day (BID) | ORAL | Status: DC

## 2011-04-02 MED ORDER — "INSULIN SYRINGE/NEEDLE 28G X 1/2"" 0.5 ML MISC": 40.0000 [IU] | Freq: Every day | Status: DC

## 2011-04-13 ENCOUNTER — Telehealth: Payer: Self-pay

## 2011-04-13 NOTE — Telephone Encounter (Signed)
[facility] called requesting multi rx refills. Per Epic several medications have recently been filled to [facility]. I advised that we will need to contact pt to see if he is aware of other refills. Called [phone], VM stated cannot receive any incoming calls. Called home number on file, which is D/C

## 2011-04-14 NOTE — Telephone Encounter (Signed)
Per pt he uses [facility]

## 2011-04-28 ENCOUNTER — Other Ambulatory Visit: Payer: Self-pay | Admitting: *Deleted

## 2011-04-28 MED ORDER — INSULIN LISPRO 100 UNIT/ML ~~LOC~~ SOLN: 40.0000 [IU] | Freq: Two times a day (BID) | SUBCUTANEOUS | Status: DC

## 2011-05-03 LAB — BASIC METABOLIC PANEL
BUN: 27 — ABNORMAL HIGH
CO2: 23
Chloride: 105
Glucose, Bld: 282 — ABNORMAL HIGH
Potassium: 5

## 2011-05-03 LAB — POCT HEMOGLOBIN-HEMACUE: Operator id: 112821

## 2011-05-26 ENCOUNTER — Other Ambulatory Visit: Payer: Self-pay | Admitting: *Deleted

## 2011-05-26 MED ORDER — GLUCOSE BLOOD VI STRP: ORAL_STRIP | Status: DC

## 2011-06-24 ENCOUNTER — Other Ambulatory Visit: Payer: Self-pay | Admitting: Internal Medicine

## 2011-07-03 ENCOUNTER — Other Ambulatory Visit: Payer: Self-pay | Admitting: Internal Medicine

## 2011-07-05 ENCOUNTER — Other Ambulatory Visit: Payer: Self-pay | Admitting: *Deleted

## 2011-07-05 MED ORDER — INSULIN LISPRO 100 UNIT/ML ~~LOC~~ SOLN: 40.0000 [IU] | Freq: Two times a day (BID) | SUBCUTANEOUS | Status: DC

## 2011-07-07 ENCOUNTER — Other Ambulatory Visit: Payer: Self-pay | Admitting: Internal Medicine

## 2011-07-08 ENCOUNTER — Ambulatory Visit (INDEPENDENT_AMBULATORY_CARE_PROVIDER_SITE_OTHER): Payer: Medicare Other | Admitting: Internal Medicine

## 2011-07-08 ENCOUNTER — Encounter: Payer: Self-pay | Admitting: Internal Medicine

## 2011-07-08 ENCOUNTER — Other Ambulatory Visit (INDEPENDENT_AMBULATORY_CARE_PROVIDER_SITE_OTHER): Payer: Medicare Other

## 2011-07-08 DIAGNOSIS — I10 Essential (primary) hypertension: Secondary | ICD-10-CM

## 2011-07-08 DIAGNOSIS — M109 Gout, unspecified: Secondary | ICD-10-CM

## 2011-07-08 DIAGNOSIS — E119 Type 2 diabetes mellitus without complications: Secondary | ICD-10-CM

## 2011-07-08 LAB — COMPREHENSIVE METABOLIC PANEL
BUN: 69 mg/dL — ABNORMAL HIGH (ref 6–23)
CO2: 20 mEq/L (ref 19–32)
Creatinine, Ser: 3.3 mg/dL — ABNORMAL HIGH (ref 0.4–1.5)
GFR: 23.18 mL/min — ABNORMAL LOW (ref >60.00)
Glucose, Bld: 189 mg/dL — ABNORMAL HIGH (ref 70–99)
Sodium: 141 mEq/L (ref 135–145)
Total Bilirubin: 0.4 mg/dL (ref 0.3–1.2)
Total Protein: 8.2 g/dL (ref 6.0–8.3)

## 2011-07-08 LAB — HEMOGLOBIN A1C: Hgb A1c MFr Bld: 7.6 % — ABNORMAL HIGH (ref 4.6–6.5)

## 2011-07-11 NOTE — Assessment & Plan Note (Addendum)
Mr. Firkus is adherent to his medical regimen. CBGs appear high but A1C @ 7.6% is improved - will not make any changes in his regimen

## 2011-07-11 NOTE — Progress Notes (Signed)
  Subjective:    Patient ID: Casey Grimes, male    DOB: 11/02/1928, 77 y.o.   MRN: [id number]  HPI Mr. Jury presents for routine follow-up for diabetes and hypertension. In the interval since his last visit he has been doing well.  He brings CBG readings from home: there were several low readings 80-90 but the majority were above 200. Reviewed lab reports from [facility] - last A1C being 8.4%. His blood pressure has been in acceptable range. He has been feeling well and has no new problems.  I have reviewed the patient's medical history in detail and updated the computerized patient record.    Review of Systems System review is negative for any constitutional, cardiac, pulmonary, GI or neuro symptoms or complaints other than as described in the HPI.     Objective:   Physical Exam Vitals reviewed - stable Gen'l- heavy-set AA man in no distress. Cor- RRR Pulm - normal respirations. Neuro - normal facial symmetry, a shuffling gait, no focal weakness  Lab Results  Component Value Date        GLUCOSE 189* 07/08/2011                       ALT 29 07/08/2011   AST 29 07/08/2011   NA 141 07/08/2011   K 4.8 07/08/2011   CL 110 07/08/2011   CREATININE 3.3* 07/08/2011   BUN 69* 07/08/2011   CO2 20 07/08/2011   HGBA1C 7.6* 07/08/2011          Assessment & Plan:

## 2011-07-11 NOTE — Assessment & Plan Note (Signed)
BP Readings from Last 3 Encounters:  07/08/11 132/70  03/30/11 110/50  12/25/10 138/70   Adequate control.

## 2011-07-14 ENCOUNTER — Encounter: Payer: Self-pay | Admitting: Internal Medicine

## 2011-07-26 ENCOUNTER — Other Ambulatory Visit: Payer: Self-pay | Admitting: *Deleted

## 2011-07-26 MED ORDER — CARVEDILOL 25 MG PO TABS: 25.0000 mg | ORAL_TABLET | Freq: Two times a day (BID) | ORAL | Status: DC

## 2011-07-26 MED ORDER — DOXAZOSIN MESYLATE 4 MG PO TABS: 4.0000 mg | ORAL_TABLET | Freq: Every day | ORAL | Status: DC

## 2011-09-17 ENCOUNTER — Other Ambulatory Visit: Payer: Self-pay | Admitting: *Deleted

## 2011-09-17 MED ORDER — INSULIN LISPRO 100 UNIT/ML ~~LOC~~ SOLN: 40.0000 [IU] | Freq: Two times a day (BID) | SUBCUTANEOUS | Status: DC

## 2011-09-22 ENCOUNTER — Other Ambulatory Visit: Payer: Self-pay | Admitting: *Deleted

## 2011-09-22 MED ORDER — INSULIN LISPRO 100 UNIT/ML ~~LOC~~ SOLN: 40.0000 [IU] | Freq: Two times a day (BID) | SUBCUTANEOUS | Status: DC

## 2011-11-03 ENCOUNTER — Other Ambulatory Visit: Payer: Self-pay | Admitting: Internal Medicine

## 2011-12-22 ENCOUNTER — Other Ambulatory Visit: Payer: Self-pay | Admitting: Internal Medicine

## 2012-01-03 ENCOUNTER — Encounter: Payer: Self-pay | Admitting: Internal Medicine

## 2012-01-03 ENCOUNTER — Ambulatory Visit (INDEPENDENT_AMBULATORY_CARE_PROVIDER_SITE_OTHER): Payer: Medicare Other | Admitting: Internal Medicine

## 2012-01-03 ENCOUNTER — Other Ambulatory Visit (INDEPENDENT_AMBULATORY_CARE_PROVIDER_SITE_OTHER): Payer: Medicare Other

## 2012-01-03 VITALS — BP 128/60 | HR 72 | Temp 98.3°F | Resp 18 | Wt 237.0 lb

## 2012-01-03 DIAGNOSIS — I10 Essential (primary) hypertension: Secondary | ICD-10-CM

## 2012-01-03 DIAGNOSIS — M109 Gout, unspecified: Secondary | ICD-10-CM

## 2012-01-03 DIAGNOSIS — E119 Type 2 diabetes mellitus without complications: Secondary | ICD-10-CM

## 2012-01-03 DIAGNOSIS — N259 Disorder resulting from impaired renal tubular function, unspecified: Secondary | ICD-10-CM

## 2012-01-03 LAB — COMPREHENSIVE METABOLIC PANEL
Alkaline Phosphatase: 73 U/L (ref 39–117)
BUN: 67 mg/dL — ABNORMAL HIGH (ref 6–23)
Glucose, Bld: 122 mg/dL — ABNORMAL HIGH (ref 70–99)
Total Bilirubin: 0.2 mg/dL — ABNORMAL LOW (ref 0.3–1.2)

## 2012-01-03 LAB — URIC ACID: Uric Acid, Serum: 6.5 mg/dL (ref 4.0–7.8)

## 2012-01-05 ENCOUNTER — Encounter: Payer: Self-pay | Admitting: Internal Medicine

## 2012-01-05 NOTE — Assessment & Plan Note (Signed)
BP Readings from Last 3 Encounters:  01/03/12 128/60  07/08/11 132/70  03/30/11 110/50   God control.  Plan Continue present medications

## 2012-01-05 NOTE — Assessment & Plan Note (Signed)
Last uric acid 6.5 - tolerating allopurinol. No recent flares of gout.  Plan Continue present medications

## 2012-01-05 NOTE — Assessment & Plan Note (Signed)
Lab Results  Component Value Date   HGBA1C 7.2* 01/03/2012   Good control on his present regimen. No changes at this time

## 2012-01-05 NOTE — Assessment & Plan Note (Signed)
No changes in regimen. He continues to follow with Dr. Lowell Guitar on a regular basis. He has been stable.

## 2012-01-05 NOTE — Progress Notes (Signed)
Subjective:    Patient ID: Casey Grimes, male    DOB: 08-13-1928, 78 y.o.   MRN: [id number]  HPI Casey Grimes presents for routine follow-up. IN the interval since his last visit he has been doing well with no new problems and no acute episodes. He has a record of his Blood sugars and blood pressure readings. He has been relatively well controlled.  Past Medical History  Diagnosis Date  . Gout   . Hypertension   . Renal insufficiency     CKD stage III, last creatinine 6-10 was 3  . Impotence   . Former smoker     quit 1988  . Diabetes mellitus     type II  . H/O: CVA (cardiovascular accident)   . Depression   . Hyperlipidemia   . Osteoarthritis    Past Surgical History  Procedure Date  . Back surgery   . Small lesion excised from back of right hand   . Lower atrial doppler 02-13-04  . Electrocardiogram 08-04-99  . Stress cardiolite 08-13-99   Family History  Problem Relation Age of Onset  . Coronary artery disease Neg Hx    History   Social History  . Marital Status: Married    Spouse Name: Casey Grimes    Number of Children: 1  . Years of Education: 6   Occupational History  . retired    Social History Main Topics  . Smoking status: Former Smoker    Quit date: 07/19/1986  . Smokeless tobacco: Former Neurosurgeon  . Alcohol Use: No  . Drug Use: No  . Sexually Active: No   Other Topics Concern  . Not on file   Social History Narrative   6th grade, married - >50 years, 1 son, others (?). Lives with his wife. Work - retired and disabled.    Current Outpatient Prescriptions on File Prior to Visit  Medication Sig Dispense Refill  . allopurinol (ZYLOPRIM) 300 MG tablet Take 150 mg by mouth daily.        Marland Kitchen amitriptyline (ELAVIL) 25 MG tablet Take 25 mg by mouth at bedtime.        Marland Kitchen amLODipine (NORVASC) 10 MG tablet TAKE ONE (1) TABLET EACH DAY                                                  GENERIC FOR NORVASC  30 tablet  5  . aspirin 325 MG EC tablet Take 650 mg by  mouth daily.        Marland Kitchen aspirin 325 MG tablet Take 325 mg by mouth daily.        Marland Kitchen atorvastatin (LIPITOR) 10 MG tablet TAKE ONE (1) TABLET EACH DAY  90 tablet  3  . B-D INS SYR MICROFINE .5CC/28G 28G X 1/2" 0.5 ML MISC USE 3 PER DAY AS DIRECTED  300 each  1  . calcitRIOL (ROCALTROL) 0.25 MCG capsule Take 0.25 mcg by mouth daily.        . carvedilol (COREG) 25 MG tablet Take 1 tablet (25 mg total) by mouth 2 (two) times daily with a meal.  60 tablet  5  . cloNIDine (CATAPRES) 0.3 MG tablet Take 0.3 mg by mouth 3 (three) times daily.        Marland Kitchen COLCRYS 0.6 MG tablet TAKE 2 TABS NOW THEN 2 TABS DAILY WHILE  HAVING GOUT PAIN  6 tablet  2  . doxazosin (CARDURA) 4 MG tablet Take 1 tablet (4 mg total) by mouth at bedtime.  90 tablet  1  . furosemide (LASIX) 40 MG tablet Take 120 mg by mouth daily.        Marland Kitchen glucose blood (ONE TOUCH TEST STRIPS) test strip Use as instructed  100 each  4  . insulin lispro (HUMALOG) 100 UNIT/ML injection Inject 40 Units into the skin 2 (two) times daily. 40 units before breakfast and lunch, 45 units before supper  40 mL  6  . LANTUS 100 UNIT/ML injection INJECT 65 UNITS INTO THE SKIN AT BEDTIME  10 mL  11  . metoCLOPramide (REGLAN) 5 MG tablet Take 5 mg by mouth 3 (three) times daily.        . potassium chloride SA (K-DUR,KLOR-CON) 20 MEQ tablet Take 1 tablet (20 mEq total) by mouth 2 (two) times daily.  180 tablet  3  . promethazine-codeine (PHENERGAN WITH CODEINE) 6.25-10 MG/5ML syrup Take 5 mLs by mouth every 6 (six) hours as needed.        . fosinopril (MONOPRIL) 40 MG tablet Take 1 tablet (40 mg total) by mouth daily.  30 tablet  11       Review of Systems System review is negative for any constitutional, cardiac, pulmonary, GI or neuro symptoms or complaints other than as described in the HPI.     Objective:   Physical Exam Filed Vitals:   01/03/12 1035  BP: 128/60  Pulse: 72  Temp: 98.3 F (36.8 C)  Resp: 18   Weight: 237 lb (107.502 kg)  Gen'l- elderly  heavy-set AA man in no distress HEENT- C&S muddy, PERRLA Cor- RRR, 2+ radial pulse PUlm - normal respirations Neuro - A&O x 3, slight facial droop, atalgic gait -  Lab Results  Component Value Date   HGB 14.3 02/24/2007   GLUCOSE 122* 01/03/2012   CHOL 105 01/17/2008   TRIG 246* 01/17/2008   HDL 24.9* 01/17/2008   LDLDIRECT 37.7 01/17/2008   ALT 29 01/03/2012   AST 25 01/03/2012   NA 138 01/03/2012   K 4.9 01/03/2012   CL 109 01/03/2012   CREATININE 3.6* 01/03/2012   BUN 67* 01/03/2012   CO2 22 01/03/2012   HGBA1C 7.2* 01/03/2012          Assessment & Plan:

## 2012-01-13 ENCOUNTER — Other Ambulatory Visit: Payer: Self-pay | Admitting: Internal Medicine

## 2012-01-14 ENCOUNTER — Other Ambulatory Visit: Payer: Self-pay | Admitting: *Deleted

## 2012-01-14 MED ORDER — INSULIN GLARGINE 100 UNIT/ML ~~LOC~~ SOLN: 65.0000 [IU] | Freq: Every day | SUBCUTANEOUS | Status: DC

## 2012-01-14 NOTE — Telephone Encounter (Signed)
Refill lantus sent to [facility]

## 2012-01-21 ENCOUNTER — Other Ambulatory Visit: Payer: Self-pay | Admitting: Internal Medicine

## 2012-01-24 ENCOUNTER — Other Ambulatory Visit: Payer: Self-pay | Admitting: General Practice

## 2012-01-24 MED ORDER — DOXAZOSIN MESYLATE 4 MG PO TABS: 4.0000 mg | ORAL_TABLET | Freq: Every day | ORAL | Status: DC

## 2012-04-27 ENCOUNTER — Ambulatory Visit: Payer: Medicare Other | Admitting: Internal Medicine

## 2012-04-27 ENCOUNTER — Other Ambulatory Visit: Payer: Self-pay | Admitting: *Deleted

## 2012-04-27 MED ORDER — FOSINOPRIL SODIUM 40 MG PO TABS: 40.0000 mg | ORAL_TABLET | Freq: Every day | ORAL | Status: DC

## 2012-04-27 MED ORDER — POTASSIUM CHLORIDE CRYS ER 20 MEQ PO TBCR: 20.0000 meq | EXTENDED_RELEASE_TABLET | Freq: Two times a day (BID) | ORAL | Status: DC

## 2012-04-27 MED ORDER — CARVEDILOL 25 MG PO TABS: 25.0000 mg | ORAL_TABLET | Freq: Two times a day (BID) | ORAL | Status: DC

## 2012-05-02 ENCOUNTER — Other Ambulatory Visit: Payer: Self-pay

## 2012-05-02 MED ORDER — INSULIN LISPRO 100 UNIT/ML ~~LOC~~ SOLN: 40.0000 [IU] | Freq: Two times a day (BID) | SUBCUTANEOUS | Status: DC

## 2012-05-04 ENCOUNTER — Ambulatory Visit (INDEPENDENT_AMBULATORY_CARE_PROVIDER_SITE_OTHER): Payer: Medicare Other | Admitting: Internal Medicine

## 2012-05-04 ENCOUNTER — Encounter: Payer: Self-pay | Admitting: Internal Medicine

## 2012-05-04 VITALS — BP 132/60 | HR 66 | Temp 98.2°F | Resp 16 | Wt 238.0 lb

## 2012-05-04 DIAGNOSIS — E119 Type 2 diabetes mellitus without complications: Secondary | ICD-10-CM

## 2012-05-04 DIAGNOSIS — N259 Disorder resulting from impaired renal tubular function, unspecified: Secondary | ICD-10-CM

## 2012-05-04 DIAGNOSIS — I10 Essential (primary) hypertension: Secondary | ICD-10-CM

## 2012-05-04 DIAGNOSIS — E785 Hyperlipidemia, unspecified: Secondary | ICD-10-CM

## 2012-05-04 DIAGNOSIS — Z0000 Encounter for general adult medical examination without abnormal findings: Secondary | ICD-10-CM | POA: Insufficient documentation

## 2012-05-04 DIAGNOSIS — M109 Gout, unspecified: Secondary | ICD-10-CM

## 2012-05-04 NOTE — Assessment & Plan Note (Signed)
No recent flares. Last uric Acid level 6.5  Plan - continue allopurinol

## 2012-05-04 NOTE — Patient Instructions (Addendum)
You seem to be doing OK. Last A1C was 7.2% which shows good long term control of the diabetes.  You will need to come back in early December for lab work.  Please continue all your present medication.

## 2012-05-04 NOTE — Assessment & Plan Note (Signed)
Interval - no new medical problems, he has been stable. Limited exam - notable for being cold and clammy. No labs today - will return for lab in December '13. Immunizations are up to date except for shingles. He is current with colorectal cancer screening and at 35 will not require further screening. He has also aged out of prostate cancer screening.  In summary - a nice man who is medically stable.

## 2012-05-04 NOTE — Progress Notes (Signed)
Subjective:    Patient ID: Casey Grimes, male    DOB: 1929-06-01, 78 y.o.   MRN: [id number]  HPI The patient is here for annual Medicare wellness examination and management of other chronic and acute problems.  Casey Grimes has been stable re: kidney function based on last note - Sept '13. He reports that he is feeling ok.    The risk factors are reflected in the social history.  The roster of all physicians providing medical care to patient - is listed in the Snapshot section of the chart.  Activities of daily living:  The patient is 100% inedpendent in all ADLs: dressing, toileting, feeding as well as independent mobility using a cane, but he doesn't drive.  Home safety : The patient has smoke detectors in the home. They wear seatbelts. No firearms at home . There is no violence in the home.   There is no risks for hepatitis, STDs or HIV. There is no   history of blood transfusion. They have no travel history to infectious disease endemic areas of the world.  The patient has not seen their dentist in the last six month- edentulous.. They have seen their eye doctor in the last year. They deny any hearing difficulty and have not had audiologic testing in the last year.    They do not  have excessive sun exposure. Discussed the need for sun protection: hats, long sleeves and use of sunscreen if there is significant sun exposure.   Diet: the importance of a healthy diet is discussed. They do have a health diet.  The patient has no regular exercise program..  The benefits of regular aerobic exercise were discussed.  Depression screen: there are no signs or vegative symptoms of depression- irritability, change in appetite, anhedonia, sadness/tearfullness.  Cognitive assessment: the patient manages all their financial and personal affairs and is actively engaged. They could relate day,date,year and events; recalled 2/3 objects at 3 minutes; cannot perform clock-face test - can't hold  pen.  Past Medical History  Diagnosis Date  . Gout   . Hypertension   . Renal insufficiency     CKD stage III, last creatinine 6-10 was 3  . Impotence   . Former smoker     quit 1988  . Diabetes mellitus     type II  . H/O: CVA (cardiovascular accident)   . Depression   . Hyperlipidemia   . Osteoarthritis    Past Surgical History  Procedure Date  . Back surgery   . Small lesion excised from back of right hand   . Lower atrial doppler 02-13-04  . Electrocardiogram 08-04-99  . Stress cardiolite 08-13-99   Family History  Problem Relation Age of Onset  . Coronary artery disease Neg Hx    History   Social History  . Marital Status: Married    Spouse Name: Casey Grimes    Number of Children: 1  . Years of Education: 6   Occupational History  . retired    Social History Main Topics  . Smoking status: Former Smoker    Quit date: 07/19/1986  . Smokeless tobacco: Former Neurosurgeon  . Alcohol Use: No  . Drug Use: No  . Sexually Active: No   Other Topics Concern  . Not on file   Social History Narrative   6th grade, married - >50 years, 1 son, others (?). Lives with his wife. Work - retired and disabled.    Current Outpatient Prescriptions on File Prior to Visit  Medication Sig Dispense Refill  . allopurinol (ZYLOPRIM) 300 MG tablet Take 150 mg by mouth daily.        Marland Kitchen amitriptyline (ELAVIL) 25 MG tablet Take 25 mg by mouth at bedtime.        Marland Kitchen amLODipine (NORVASC) 10 MG tablet TAKE ONE (1) TABLET EACH DAY                                                  GENERIC FOR NORVASC  30 tablet  5  . aspirin 325 MG EC tablet Take 650 mg by mouth daily.        Marland Kitchen aspirin 325 MG tablet Take 325 mg by mouth daily.        Marland Kitchen atorvastatin (LIPITOR) 10 MG tablet TAKE ONE (1) TABLET EACH DAY  90 tablet  3  . B-D INS SYR MICROFINE .5CC/28G 28G X 1/2" 0.5 ML MISC USE 3 PER DAY AS DIRECTED  300 each  1  . calcitRIOL (ROCALTROL) 0.25 MCG capsule Take 0.25 mcg by mouth daily.        . carvedilol  (COREG) 25 MG tablet Take 1 tablet (25 mg total) by mouth 2 (two) times daily with a meal.  60 tablet  5  . cloNIDine (CATAPRES) 0.3 MG tablet Take 0.3 mg by mouth 3 (three) times daily.        Marland Kitchen COLCRYS 0.6 MG tablet TAKE 2 TABS NOW THEN 2 TABS DAILY WHILE HAVING GOUT PAIN  6 tablet  2  . doxazosin (CARDURA) 4 MG tablet Take 1 tablet (4 mg total) by mouth at bedtime.  90 tablet  1  . fosinopril (MONOPRIL) 40 MG tablet Take 1 tablet (40 mg total) by mouth daily.  30 tablet  11  . furosemide (LASIX) 40 MG tablet Take 120 mg by mouth daily.        Marland Kitchen glucose blood (ONE TOUCH TEST STRIPS) test strip Use as instructed  100 each  4  . insulin glargine (LANTUS) 100 UNIT/ML injection Inject 65 Units into the skin at bedtime.  10 mL  11  . insulin lispro (HUMALOG) 100 UNIT/ML injection Inject 40 Units into the skin 2 (two) times daily. 40 units before breakfast and lunch, 45 units before supper  40 mL  6  . metoCLOPramide (REGLAN) 5 MG tablet Take 5 mg by mouth 3 (three) times daily.        . potassium chloride SA (K-DUR,KLOR-CON) 20 MEQ tablet Take 1 tablet (20 mEq total) by mouth 2 (two) times daily.  180 tablet  3  . promethazine-codeine (PHENERGAN WITH CODEINE) 6.25-10 MG/5ML syrup Take 5 mLs by mouth every 6 (six) hours as needed.            During the course of the visit the patient was educated and counseled about appropriate screening and preventive services including : fall prevention , diabetes screening, nutrition counseling, colorectal cancer screening, and recommended immunizations.    Review of Systems Constitutional:  Negative for fever, chills, activity change and unexpected weight change.  HEENT:  Negative for hearing loss, ear pain, congestion, neck stiffness and postnasal drip. Negative for sore throat or swallowing problems.   Eyes: Negative for vision loss or change in visual acuity.  Respiratory: Negative for chest tightness and wheezing. Negative for DOE.   Cardiovascular:  Negative for chest  pain or palpitations.  Gastrointestinal: No change in bowel habit. No bloating or gas. No reflux or indigestion Genitourinary: Negative for urgency, frequency, flank pain and difficulty urinating.  Musculoskeletal: Negative for myalgias, back pain, arthralgias and gait problem.  Neurological: Negative for dizziness, tremors, weakness and headaches.  Hematological: Negative for adenopathy.  Psychiatric/Behavioral: Negative for behavioral problems and dysphoric mood.       Objective:   Physical Exam Filed Vitals:   05/04/12 1035  BP: 132/60  Pulse: 66  Temp: 98.2 F (36.8 C)  Resp: 16   Wt Readings from Last 3 Encounters:  05/04/12 238 lb (107.956 kg)  01/03/12 237 lb (107.502 kg)  07/08/11 233 lb (105.688 kg)   Gen'l- overweight Aa man who is cold and clammy and has increase WOB* HEENT- C&S clear, edentulous no oral lesions, enlarged lip Neck - supple Cor- 2+ radial pulse, RRR PUlm - increased respiratory weight, no rales or wheezes Abd- obese, soft, very active bowel sounds Neuro- mild right facial  Droop, poor fine motor coordination right hand, ambulates with a cane.   *after 4 0z coca cola his breathing was less labored, he was much less clammy. His son was advised to be sure that he got something to eat right away.     Assessment & Plan:

## 2012-05-04 NOTE — Assessment & Plan Note (Signed)
Chronic problem. Reviewed last nephrology note - starting to talk about creating vascular access  Plan - lab in December

## 2012-05-04 NOTE — Assessment & Plan Note (Signed)
Lab Results  Component Value Date   HGBA1C 7.2* 01/03/2012   Despite elevated CBGs long term control is very good.  Plan - continue present regimen  Provided samples ( 2 novolog qwikpens, 1 solostar pen)

## 2012-05-04 NOTE — Assessment & Plan Note (Signed)
BP Readings from Last 3 Encounters:  05/04/12 132/60  01/03/12 128/60  07/08/11 132/70   Good control on present medications. No changes at this time

## 2012-05-23 ENCOUNTER — Other Ambulatory Visit: Payer: Self-pay | Admitting: *Deleted

## 2012-05-23 NOTE — Telephone Encounter (Signed)
Pharmacy notified of refill for humalog vial insulin. Pharmacy had wrong phone # for e-scribe .

## 2012-06-12 ENCOUNTER — Other Ambulatory Visit: Payer: Self-pay | Admitting: *Deleted

## 2012-06-12 ENCOUNTER — Other Ambulatory Visit: Payer: Self-pay | Admitting: Internal Medicine

## 2012-06-12 MED ORDER — "INSULIN SYRINGE 28G X 1/2"" 0.5 ML MISC": Status: DC

## 2012-06-12 MED ORDER — ATORVASTATIN CALCIUM 10 MG PO TABS: ORAL_TABLET | ORAL | Status: DC

## 2012-06-12 NOTE — Telephone Encounter (Signed)
R'cd fax from [facility] for refill of insulin syringe.

## 2012-06-12 NOTE — Telephone Encounter (Signed)
R'cd fax from [facility] for refill of Lipitor.

## 2012-06-19 ENCOUNTER — Encounter: Payer: Self-pay | Admitting: Internal Medicine

## 2012-06-19 ENCOUNTER — Ambulatory Visit (INDEPENDENT_AMBULATORY_CARE_PROVIDER_SITE_OTHER): Payer: Medicare Other | Admitting: Internal Medicine

## 2012-06-19 ENCOUNTER — Other Ambulatory Visit (INDEPENDENT_AMBULATORY_CARE_PROVIDER_SITE_OTHER): Payer: Medicare Other

## 2012-06-19 VITALS — BP 120/56 | HR 73 | Temp 97.8°F | Resp 10 | Wt 238.1 lb

## 2012-06-19 DIAGNOSIS — N259 Disorder resulting from impaired renal tubular function, unspecified: Secondary | ICD-10-CM

## 2012-06-19 DIAGNOSIS — I10 Essential (primary) hypertension: Secondary | ICD-10-CM

## 2012-06-19 DIAGNOSIS — E119 Type 2 diabetes mellitus without complications: Secondary | ICD-10-CM

## 2012-06-19 DIAGNOSIS — M109 Gout, unspecified: Secondary | ICD-10-CM

## 2012-06-19 DIAGNOSIS — E785 Hyperlipidemia, unspecified: Secondary | ICD-10-CM

## 2012-06-19 LAB — COMPREHENSIVE METABOLIC PANEL
AST: 23 U/L (ref 0–37)
Albumin: 3.9 g/dL (ref 3.5–5.2)
Alkaline Phosphatase: 65 U/L (ref 39–117)
BUN: 74 mg/dL — ABNORMAL HIGH (ref 6–23)
Potassium: 4.9 mEq/L (ref 3.5–5.1)
Sodium: 136 mEq/L (ref 135–145)
Total Bilirubin: 0.5 mg/dL (ref 0.3–1.2)
Total Protein: 7.8 g/dL (ref 6.0–8.3)

## 2012-06-19 LAB — HEPATIC FUNCTION PANEL
ALT: 21 U/L (ref 0–53)
AST: 23 U/L (ref 0–37)
Alkaline Phosphatase: 65 U/L (ref 39–117)
Total Bilirubin: 0.5 mg/dL (ref 0.3–1.2)

## 2012-06-19 LAB — LIPID PANEL
LDL Cholesterol: 28 mg/dL (ref 0–99)
VLDL: 38.2 mg/dL (ref 0.0–40.0)

## 2012-06-19 LAB — HEMOGLOBIN A1C: Hgb A1c MFr Bld: 7.8 % — ABNORMAL HIGH (ref 4.6–6.5)

## 2012-06-19 NOTE — Progress Notes (Signed)
Patient ID: CEDAR DITULLIO, male   DOB: 14-Jan-1929, 78 y.o.   MRN: [id number]  HPI Mr. Hevia is a very pleasant 78 yo gentleman who presents for diabetes follow-up. His last Hgb A1C was 7.2 in June 2013. He feels like he has been doing pretty well with diabetes management. He takes Humalog before breakfast, before lunch, and after dinner. He also takes Lantus at bedtime. He has no trouble with these injections. He checks his blood sugar at home four times a day and has brought these numbers from the last two weeks. Readings range from 62 (low) to 290 (high), with most values in the mid-100s. He is not sure whether he needs new prescriptions; the drug store usually calls him.  He denies changes in vision, sensation, or balance, or any other acute health complains.   ROS -Denies fever, chills, cough, night sweats, changes in weight -Denies vision, sensation, or balance -Denies skin changes   Past Medical History  Diagnosis Date  . Gout   . Hypertension   . Renal insufficiency     CKD stage III, last creatinine 6-10 was 3  . Impotence   . Former smoker     quit 1988  . Diabetes mellitus     type II  . H/O: CVA (cardiovascular accident)   . Depression   . Hyperlipidemia   . Osteoarthritis    Past Surgical History  Procedure Date  . Back surgery   . Small lesion excised from back of right hand   . Lower atrial doppler 02-13-04  . Electrocardiogram 08-04-99  . Stress cardiolite 08-13-99   Family History  Problem Relation Age of Onset  . Coronary artery disease Neg Hx    History   Social History  . Marital Status: Married    Spouse Name: Adela Lank    Number of Children: 1  . Years of Education: 6   Occupational History  . retired    Social History Main Topics  . Smoking status: Former Smoker    Quit date: 07/19/1986  . Smokeless tobacco: Former Neurosurgeon  . Alcohol Use: No  . Drug Use: No  . Sexually Active: No   Other Topics Concern  . Not on file   Social History  Narrative   6th grade, married - >50 years, 1 son, others (?). Lives with his wife. Work - retired and disabled.   Current Outpatient Prescriptions on File Prior to Visit  Medication Sig Dispense Refill  . allopurinol (ZYLOPRIM) 300 MG tablet Take 150 mg by mouth daily.        Marland Kitchen amitriptyline (ELAVIL) 25 MG tablet Take 25 mg by mouth at bedtime.        Marland Kitchen amLODipine (NORVASC) 10 MG tablet TAKE ONE (1) TABLET EACH DAY  30 tablet  5  . aspirin 325 MG EC tablet Take 650 mg by mouth daily.        Marland Kitchen aspirin 325 MG tablet Take 325 mg by mouth daily.        Marland Kitchen atorvastatin (LIPITOR) 10 MG tablet TAKE ONE (1) TABLET EACH DAY  90 tablet  2  . calcitRIOL (ROCALTROL) 0.25 MCG capsule Take 0.25 mcg by mouth daily.        . carvedilol (COREG) 25 MG tablet Take 1 tablet (25 mg total) by mouth 2 (two) times daily with a meal.  60 tablet  5  . cloNIDine (CATAPRES) 0.3 MG tablet Take 0.3 mg by mouth 3 (three) times daily.        Marland Kitchen  COLCRYS 0.6 MG tablet TAKE 2 TABS NOW THEN 2 TABS DAILY WHILE HAVING GOUT PAIN  6 tablet  2  . doxazosin (CARDURA) 4 MG tablet Take 1 tablet (4 mg total) by mouth at bedtime.  90 tablet  1  . fosinopril (MONOPRIL) 40 MG tablet Take 1 tablet (40 mg total) by mouth daily.  30 tablet  11  . furosemide (LASIX) 40 MG tablet Take 120 mg by mouth daily.        Marland Kitchen glucose blood (ONE TOUCH TEST STRIPS) test strip Use as instructed  100 each  4  . insulin glargine (LANTUS) 100 UNIT/ML injection Inject 65 Units into the skin at bedtime.  10 mL  11  . insulin lispro (HUMALOG) 100 UNIT/ML injection Inject 40 Units into the skin 2 (two) times daily. 40 units before breakfast and lunch, 45 units before supper  40 mL  6  . INSULIN SYRINGE .5CC/28G (B-D INS SYR MICROFINE .5CC/28G) 28G X 1/2" 0.5 ML MISC USE 3 PER DAY AS DIRECTED  300 each  1  . metoCLOPramide (REGLAN) 5 MG tablet Take 5 mg by mouth 3 (three) times daily.        . potassium chloride SA (K-DUR,KLOR-CON) 20 MEQ tablet Take 1 tablet (20 mEq  total) by mouth 2 (two) times daily.  180 tablet  3  . promethazine-codeine (PHENERGAN WITH CODEINE) 6.25-10 MG/5ML syrup Take 5 mLs by mouth every 6 (six) hours as needed.          PE General: Pleasant older gentleman in NAD CV: Regular rate and rhythm, S1 and S2 present. Pulse regular. Pulm: Lungs clear to auscultation bilaterally Neuro: Alert and appropriate. Slow but pleasant affect. Sensation intact distally. 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 Ingrown toenail on right big toe. Dry, scaly skin. Erosion on third toe of right foot. No ulcers or skin changes on soles of feet.  A/P Mr. Barbar is a very pleasant 78 yo gentleman who presents for diabetes follow-up.  # Diabetes Management 1. Lab work - today to check Hgb A1C 2. Foot care -  Patient encouraged to clip nails and care for feet. Possibility of seeing a podiatrist in the future discussed. 3. Medications - Humalog 40-40-45 (40 units before breakfast, 40 units before lunch, 45 units before dinner), Lantus 65 units before bed. Patient counseled to take Humalog BEFORE, not after, dinner.  I personally interviewed and examined the patient. I agree with the assessment and plans per Ms. Jhane Lorio, MS III  M.Norins, MD

## 2012-06-19 NOTE — Patient Instructions (Addendum)
Thank you for coming to see Casey Grimes today. Here is a summary of our visit and recommendations for diabetic management.  1. Lab work Today to check Hgb A1C  2. Foot care Medicare will pay for podiatrist to trim nails. Let Casey Grimes know if you are interested in this. In the meantime, continue to clip your nails and care for your feet.  3. Medications Humalog 40-40-45 (40 units before breakfast, 40 units before lunch, 45 units before dinner) Lantus 65 units before bed

## 2012-06-20 ENCOUNTER — Encounter: Payer: Self-pay | Admitting: Internal Medicine

## 2012-06-20 NOTE — Assessment & Plan Note (Signed)
No report of recent flares. Uric Acid Dec '12 - 6.8  Plan - continue allopurinol.

## 2012-06-20 NOTE — Assessment & Plan Note (Signed)
Due for A1C. His home CBG record suggests good long term control.   Current regimen: Lantus 65 units qHS, Humalog 40, 40, 45

## 2012-06-20 NOTE — Assessment & Plan Note (Signed)
He remains stable. His next appointment with nephrology is Feb '14.

## 2012-06-20 NOTE — Assessment & Plan Note (Signed)
BP Readings from Last 3 Encounters:  06/19/12 120/56  05/04/12 132/60  01/03/12 128/60   Great control on present medications.

## 2012-06-29 ENCOUNTER — Other Ambulatory Visit: Payer: Self-pay | Admitting: Internal Medicine

## 2012-07-17 ENCOUNTER — Other Ambulatory Visit: Payer: Self-pay | Admitting: *Deleted

## 2012-07-17 MED ORDER — INSULIN GLARGINE 100 UNIT/ML ~~LOC~~ SOLN: 65.0000 [IU] | Freq: Every day | SUBCUTANEOUS | Status: DC

## 2012-07-28 ENCOUNTER — Other Ambulatory Visit: Payer: Self-pay | Admitting: *Deleted

## 2012-07-30 MED ORDER — DOXAZOSIN MESYLATE 4 MG PO TABS: 4.0000 mg | ORAL_TABLET | Freq: Every day | ORAL | Status: DC

## 2012-08-09 ENCOUNTER — Other Ambulatory Visit: Payer: Self-pay | Admitting: *Deleted

## 2012-09-27 ENCOUNTER — Telehealth: Payer: Self-pay | Admitting: Internal Medicine

## 2012-09-27 NOTE — Telephone Encounter (Signed)
Patient Information:  Caller Name: Adela Lank  Phone: [phone]  Patient: Casey Grimes, Casey Grimes  Gender: Male  DOB: March 27, 1929  Age: 79 Years  PCP: Illene Regulus (Adults only)  Office Follow Up:  Does the office need to follow up with this patient?: Yes  Instructions For The Office: Please follow up patient about unresolved cough at night with wheezing.  RN Note:  Denies blowing out or coughing up color; Good appetite and drinking well; Is urinating regularly.  Is aware of congestion at night and can't seem to cough it up with it  interrupting his sleep. Wife has not heard him but patient believes he wheezes at night. Patient is a diabetic. Care advice given with caller demonstrating understanding.  Please follow up with patient--has had a cold for past 2 weeks and is wanting something for chest congestion and wheezing. He is out of cough medicine given to patient in 12/13. Drug store of choice is [facility] on [address]; [phone]  Symptoms  Reason For Call & Symptoms: Has a cold  Reviewed Health History In EMR: Yes  Reviewed Medications In EMR: Yes  Reviewed Allergies In EMR: Yes  Reviewed Surgeries / Procedures: Yes  Date of Onset of Symptoms: 09/13/2012  Treatments Tried: Diabetic cough syrup, Tylenol  Treatments Tried Worked: Yes  Guideline(s) Used:  Colds  Disposition Per Guideline:   Home Care  Reason For Disposition Reached:   Colds with no complications  Advice Given:  For a Stuffy Nose - Use Nasal Washes:  Introduction: Saline (salt water) nasal irrigation (nasal wash) is an effective and simple home remedy for treating stuffy nose and sinus congestion. The nose can be irrigated by pouring, spraying, or squirting salt water into the nose and then letting it run back out.  For a Runny Nose With Profuse Discharge:   Nasal mucus and discharge helps to wash viruses and bacteria out of the nose and sinuses.  Blowing the nose is all that is needed.  If the skin around  your nostrils gets irritated, apply a tiny amount of petroleum ointment to the nasal openings once or twice a day.  Treatment for Associated Symptoms of Colds:  For muscle aches, headaches, or moderate fever (more than 101 F or 38.9 C): Take acetaminophen every 4 hours.  Sore throat: Try throat lozenges, hard candy, or warm chicken broth.  Cough: Use cough drops.  Hydrate: Drink adequate liquids.  Treatment for Associated Symptoms of Colds:  Sore throat: Try throat lozenges, hard candy, or warm chicken broth.  Cough: Use cough drops.  Hydrate: Drink adequate liquids.  Humidifier:  If the air in your home is dry, use a cool-mist humidifier  Expected Course:   Cough up to 2-3 weeks.  Call Back If:  Difficulty breathing occurs  Fever lasts more than 3 days  Nasal discharge lasts more than 10 days  Cough lasts more than 3 weeks  You become worse

## 2012-09-27 NOTE — Telephone Encounter (Signed)
Add  On to schedule for tomorrow

## 2012-10-27 ENCOUNTER — Other Ambulatory Visit: Payer: Self-pay | Admitting: Internal Medicine

## 2012-12-18 ENCOUNTER — Other Ambulatory Visit: Payer: Self-pay

## 2012-12-18 MED ORDER — CLONIDINE HCL 0.3 MG PO TABS: 0.3000 mg | ORAL_TABLET | Freq: Three times a day (TID) | ORAL | Status: DC

## 2012-12-19 ENCOUNTER — Telehealth: Payer: Self-pay | Admitting: *Deleted

## 2012-12-19 MED ORDER — PROMETHAZINE HCL 25 MG PO TABS: 25.0000 mg | ORAL_TABLET | Freq: Three times a day (TID) | ORAL | Status: DC | PRN

## 2012-12-19 NOTE — Telephone Encounter (Signed)
Sent Rx to pharmacy. Called and spoke w/ Mrs. Capell

## 2012-12-19 NOTE — Telephone Encounter (Signed)
Left msg on triage requesting md to rx something for nausea. He has been very nauseated with little appetite...Raechel Chute

## 2013-01-03 ENCOUNTER — Telehealth: Payer: Self-pay

## 2013-01-03 NOTE — Telephone Encounter (Signed)
Phone call from a representative at [facility] stating patient requested a heating pad and Extenal vacuum erection device. I called patient to see if this is correct but both phone number listed for him are unavailable.

## 2013-01-04 ENCOUNTER — Other Ambulatory Visit: Payer: Self-pay | Admitting: Internal Medicine

## 2013-01-06 ENCOUNTER — Other Ambulatory Visit: Payer: Self-pay | Admitting: Internal Medicine

## 2013-01-09 ENCOUNTER — Emergency Department (HOSPITAL_COMMUNITY)
Admission: EM | Admit: 2013-01-09 | Discharge: 2013-01-10 | Disposition: A | Discharge status: Home / Self Care | Payer: PRIVATE HEALTH INSURANCE | Attending: Emergency Medicine | Admitting: Emergency Medicine

## 2013-01-09 ENCOUNTER — Encounter (HOSPITAL_COMMUNITY): Payer: Self-pay | Admitting: Emergency Medicine

## 2013-01-09 DIAGNOSIS — Z79899 Other long term (current) drug therapy: Secondary | ICD-10-CM | POA: Insufficient documentation

## 2013-01-09 DIAGNOSIS — N183 Chronic kidney disease, stage 3 unspecified: Secondary | ICD-10-CM | POA: Insufficient documentation

## 2013-01-09 DIAGNOSIS — Z8639 Personal history of other endocrine, nutritional and metabolic disease: Secondary | ICD-10-CM | POA: Insufficient documentation

## 2013-01-09 DIAGNOSIS — Z794 Long term (current) use of insulin: Secondary | ICD-10-CM | POA: Insufficient documentation

## 2013-01-09 DIAGNOSIS — F329 Major depressive disorder, single episode, unspecified: Secondary | ICD-10-CM | POA: Insufficient documentation

## 2013-01-09 DIAGNOSIS — Z862 Personal history of diseases of the blood and blood-forming organs and certain disorders involving the immune mechanism: Secondary | ICD-10-CM | POA: Insufficient documentation

## 2013-01-09 DIAGNOSIS — E785 Hyperlipidemia, unspecified: Secondary | ICD-10-CM | POA: Insufficient documentation

## 2013-01-09 DIAGNOSIS — F3289 Other specified depressive episodes: Secondary | ICD-10-CM | POA: Insufficient documentation

## 2013-01-09 DIAGNOSIS — M1990 Unspecified osteoarthritis, unspecified site: Secondary | ICD-10-CM | POA: Insufficient documentation

## 2013-01-09 DIAGNOSIS — R251 Tremor, unspecified: Secondary | ICD-10-CM

## 2013-01-09 DIAGNOSIS — Z87891 Personal history of nicotine dependence: Secondary | ICD-10-CM | POA: Insufficient documentation

## 2013-01-09 DIAGNOSIS — E119 Type 2 diabetes mellitus without complications: Secondary | ICD-10-CM | POA: Insufficient documentation

## 2013-01-09 DIAGNOSIS — I129 Hypertensive chronic kidney disease with stage 1 through stage 4 chronic kidney disease, or unspecified chronic kidney disease: Secondary | ICD-10-CM | POA: Insufficient documentation

## 2013-01-09 DIAGNOSIS — Z7982 Long term (current) use of aspirin: Secondary | ICD-10-CM | POA: Insufficient documentation

## 2013-01-09 DIAGNOSIS — R259 Unspecified abnormal involuntary movements: Secondary | ICD-10-CM | POA: Insufficient documentation

## 2013-01-09 DIAGNOSIS — Z8673 Personal history of transient ischemic attack (TIA), and cerebral infarction without residual deficits: Secondary | ICD-10-CM | POA: Insufficient documentation

## 2013-01-09 LAB — COMPREHENSIVE METABOLIC PANEL
ALT: 21 U/L (ref 0–53)
AST: 20 U/L (ref 0–37)
Alkaline Phosphatase: 78 U/L (ref 39–117)
CO2: 25 mEq/L (ref 19–32)
Chloride: 100 mEq/L (ref 96–112)
GFR calc non Af Amer: 16 mL/min — ABNORMAL LOW (ref >90)
Potassium: 4.2 mEq/L (ref 3.5–5.1)
Sodium: 137 mEq/L (ref 135–145)
Total Bilirubin: 0.2 mg/dL — ABNORMAL LOW (ref 0.3–1.2)

## 2013-01-09 LAB — CBC WITH DIFFERENTIAL/PLATELET
Basophils Absolute: 0 10*3/uL (ref 0.0–0.1)
HCT: 35.5 % — ABNORMAL LOW (ref 39.0–52.0)
Lymphocytes Relative: 38 % (ref 12–46)
Monocytes Absolute: 0.4 10*3/uL (ref 0.1–1.0)
Neutro Abs: 1.9 10*3/uL (ref 1.7–7.7)
Neutrophils Relative %: 44 % (ref 43–77)
RDW: 15.2 % (ref 11.5–15.5)
WBC: 4.4 10*3/uL (ref 4.0–10.5)

## 2013-01-09 LAB — GLUCOSE, CAPILLARY: Glucose-Capillary: 209 mg/dL — ABNORMAL HIGH (ref 70–99)

## 2013-01-09 MED ORDER — DIPHENHYDRAMINE HCL 25 MG PO CAPS
25.0000 mg | ORAL_CAPSULE | Freq: Once | ORAL | Status: AC
  Administered 2013-01-09: 25 mg via ORAL
  Filled 2013-01-09: qty 1

## 2013-01-09 NOTE — ED Provider Notes (Signed)
History    CSN: [id number] Arrival date & time 01/09/13  1844  First MD Initiated Contact with Patient 01/09/13 2252     Chief Complaint  Patient presents with  . Tremors   (Consider location/radiation/quality/duration/timing/severity/associated sxs/prior Treatment) The history is provided by the patient.   79 year old male has been having episodes of total body shaking which started yesterday and got worse today. He denies any pain anywhere. He specifically denies headache, vision change, dizziness, dyspnea, chest pain, chest heaviness, chest tightness, chest pressure, nausea, vomiting, diarrhea, arthralgias, myalgias, weakness. Nothing seems to make the symptoms better or worse. He has not tried anything. He has had 2 medications started within the last month. One is sodium bicarbonate and the other was promethazine. Last dose of promethazine was 2 days ago and he had emptied a bottle of 20 tablets which was prescribed on June 3. Past Medical History  Diagnosis Date  . Gout   . Hypertension   . Renal insufficiency     CKD stage III, last creatinine 6-10 was 3  . Impotence   . Former smoker     quit 1988  . Diabetes mellitus     type II  . H/O: CVA (cardiovascular accident)   . Depression   . Hyperlipidemia   . Osteoarthritis    Past Surgical History  Procedure Laterality Date  . Back surgery    . Small lesion excised from back of right hand    . Lower atrial doppler  02-13-04  . Electrocardiogram  08-04-99  . Stress cardiolite  08-13-99   Family History  Problem Relation Age of Onset  . Coronary artery disease Neg Hx    History  Substance Use Topics  . Smoking status: Former Smoker    Quit date: 07/19/1986  . Smokeless tobacco: Former Neurosurgeon  . Alcohol Use: No    Review of Systems  All other systems reviewed and are negative.    Allergies  Review of patient's allergies indicates no known allergies.  Home Medications   Current Outpatient Rx  Name  Route  Sig   Dispense  Refill  . allopurinol (ZYLOPRIM) 300 MG tablet   Oral   Take 150 mg by mouth daily.           Marland Kitchen amLODipine (NORVASC) 10 MG tablet   Oral   Take 10 mg by mouth daily.         Marland Kitchen aspirin EC 325 MG tablet   Oral   Take 325 mg by mouth daily.         Marland Kitchen atorvastatin (LIPITOR) 10 MG tablet   Oral   Take 10 mg by mouth every morning.         . calcitRIOL (ROCALTROL) 0.25 MCG capsule   Oral   Take 0.25 mcg by mouth daily.           . carvedilol (COREG) 25 MG tablet   Oral   Take 25 mg by mouth 2 (two) times daily with a meal.         . cloNIDine (CATAPRES) 0.3 MG tablet   Oral   Take 1 tablet (0.3 mg total) by mouth 3 (three) times daily.   90 tablet   5   . doxazosin (CARDURA) 4 MG tablet   Oral   Take 1 tablet (4 mg total) by mouth at bedtime.   90 tablet   1   . fosinopril (MONOPRIL) 40 MG tablet   Oral   Take 1  tablet (40 mg total) by mouth daily.   30 tablet   11   . furosemide (LASIX) 80 MG tablet   Oral   Take 80-160 mg by mouth 2 (two) times daily. 2 tabs in the am, 1 tab in the pm         . glucose blood (ONE TOUCH TEST STRIPS) test strip      Use as instructed   100 each   4   . insulin glargine (LANTUS) 100 UNIT/ML injection   Subcutaneous   Inject 65 Units into the skin at bedtime.         . insulin lispro (HUMALOG) 100 UNIT/ML injection   Subcutaneous   Inject 40-45 Units into the skin 3 (three) times daily before meals. Inject 40 units daily before breakfast and lunch, then inject 45 units before supper         . INSULIN SYRINGE .5CC/28G (B-D INS SYR MICROFINE .5CC/28G) 28G X 1/2" 0.5 ML MISC      USE 3 PER DAY AS DIRECTED   300 each   1   . potassium chloride SA (K-DUR,KLOR-CON) 20 MEQ tablet   Oral   Take 1 tablet (20 mEq total) by mouth 2 (two) times daily.   180 tablet   3   . promethazine (PHENERGAN) 25 MG tablet   Oral   Take 25 mg by mouth every 8 (eight) hours as needed for nausea.         .  sodium bicarbonate 650 MG tablet   Oral   Take 650 mg by mouth 3 (three) times daily.          BP 160/66  Pulse 79  Temp(Src) 98.5 F (36.9 C) (Oral)  Resp 18  SpO2 99% Physical Exam  Nursing note and vitals reviewed.  79 year old male, resting comfortably and in no acute distress. Vital signs are significant for a systolic hypertension with blood pressure 160/66. Oxygen saturation is 99%, which is normal. Head is normocephalic and atraumatic. PERRLA, EOMI. Oropharynx is clear. Arcus senilis is present. Neck is nontender and supple without adenopathy or JVD. Back is nontender and there is no CVA tenderness. Lungs are clear without rales, wheezes, or rhonchi. Chest is nontender. Heart has regular rate and rhythm with 1-2/6 systolic murmur at the cardiac base. Abdomen is soft, flat, nontender without masses or hepatosplenomegaly and peristalsis is normoactive. Extremities have trace edema, full range of motion is present. Skin is warm and dry without rash. Neurologic: Mental status is normal, cranial nerves are intact, there are no motor or sensory deficits. He has intermittent generalized body shakes which only last about 2-3 seconds. This does not have the appearance of a tremor and there does not appear to be voluntary. No tremor is detected and there is no cogwheel rigidity.  ED Course  Procedures (including critical care time) Results for orders placed during the hospital encounter of 01/09/13  GLUCOSE, CAPILLARY      Result Value Range   Glucose-Capillary 209 (*) 70 - 99 mg/dL   Comment 1 Documented in Chart    CBC WITH DIFFERENTIAL      Result Value Range   WBC 4.4  4.0 - 10.5 K/uL   RBC 4.21 (*) 4.22 - 5.81 MIL/uL   Hemoglobin 11.8 (*) 13.0 - 17.0 g/dL   HCT 16.1 (*) 09.6 - 04.5 %   MCV 84.3  78.0 - 100.0 fL   MCH 28.0  26.0 - 34.0 pg  MCHC 33.2  30.0 - 36.0 g/dL   RDW 16.1  09.6 - 04.5 %   Platelets 255  150 - 400 K/uL   Neutrophils Relative % 44  43 - 77 %    Neutro Abs 1.9  1.7 - 7.7 K/uL   Lymphocytes Relative 38  12 - 46 %   Lymphs Abs 1.7  0.7 - 4.0 K/uL   Monocytes Relative 9  3 - 12 %   Monocytes Absolute 0.4  0.1 - 1.0 K/uL   Eosinophils Relative 9 (*) 0 - 5 %   Eosinophils Absolute 0.4  0.0 - 0.7 K/uL   Basophils Relative 1  0 - 1 %   Basophils Absolute 0.0  0.0 - 0.1 K/uL  COMPREHENSIVE METABOLIC PANEL      Result Value Range   Sodium 137  135 - 145 mEq/L   Potassium 4.2  3.5 - 5.1 mEq/L   Chloride 100  96 - 112 mEq/L   CO2 25  19 - 32 mEq/L   Glucose, Bld 234 (*) 70 - 99 mg/dL   BUN 72 (*) 6 - 23 mg/dL   Creatinine, Ser 4.09 (*) 0.50 - 1.35 mg/dL   Calcium 9.6  8.4 - 81.1 mg/dL   Total Protein 8.3  6.0 - 8.3 g/dL   Albumin 3.7  3.5 - 5.2 g/dL   AST 20  0 - 37 U/L   ALT 21  0 - 53 U/L   Alkaline Phosphatase 78  39 - 117 U/L   Total Bilirubin 0.2 (*) 0.3 - 1.2 mg/dL   GFR calc non Af Amer 16 (*) >90 mL/min   GFR calc Af Amer 19 (*) >90 mL/min     1. Shaking     MDM  Body shaking of uncertain cause. Laboratory testing is unremarkable. He has a mild anemia new but only previous value was in 2008 at which time a creatinine level was only mildly elevated, and moderate renal insufficiency which is essentially unchanged from baseline. I do wonder if this might be a side effect of promethazine and he is given a therapeutic trial of diphenhydramine.  12:13 AM There is no relief with diphenhydramine. He'll be given a therapeutic trial of lorazepam.  1:38 AM There's been no improvement with lorazepam. At this point, I am going to discharge him and refer him to neurology for further outpatient evaluation.  Dione Booze, MD 01/10/13 [phone]

## 2013-01-09 NOTE — ED Notes (Signed)
PT. REPORTS GENERALIZED BODY SHAKING/ TREMORS ONSET YESTERDAY , PT. ADDED THAT HIS BLOOD SUGARHAS BEEN HIGH FOR THE PAST SEVERAL DAYS , DENIES PAIN /RESPIRATIONS UNLABORED . ALERT AND ORIENTED.

## 2013-01-10 ENCOUNTER — Ambulatory Visit (INDEPENDENT_AMBULATORY_CARE_PROVIDER_SITE_OTHER): Payer: PRIVATE HEALTH INSURANCE | Admitting: Internal Medicine

## 2013-01-10 ENCOUNTER — Encounter: Payer: Self-pay | Admitting: Internal Medicine

## 2013-01-10 ENCOUNTER — Other Ambulatory Visit: Payer: Self-pay | Admitting: Internal Medicine

## 2013-01-10 VITALS — BP 140/60 | HR 80 | Temp 98.4°F | Wt 235.2 lb

## 2013-01-10 DIAGNOSIS — R259 Unspecified abnormal involuntary movements: Secondary | ICD-10-CM

## 2013-01-10 DIAGNOSIS — G252 Other specified forms of tremor: Secondary | ICD-10-CM

## 2013-01-10 MED ORDER — LORAZEPAM 1 MG PO TABS
1.0000 mg | ORAL_TABLET | Freq: Once | ORAL | Status: AC
  Administered 2013-01-10: 1 mg via ORAL
  Filled 2013-01-10: qty 1

## 2013-01-10 NOTE — Patient Instructions (Addendum)
Tremor  Tremor is a rhythmic, involuntary muscular contraction characterized by oscillations (to-and-fro movements) of a part of the body. The most common of all involuntary movements, tremor can affect various body parts such as the hands, head, facial structures, vocal cords, trunk, and legs; most tremors, however, occur in the hands. Tremor often accompanies neurological disorders associated with aging. Although the disorder is not life-threatening, it can be responsible for functional disability and social embarrassment.  TREATMENT   There are many types of tremor and several ways in which tremor is classified. The most common classification is by behavioral context or position. There are five categories of tremor within this classification: resting, postural, kinetic, task-specific, and psychogenic. Resting or static tremor occurs when the muscle is at rest, for example when the hands are lying on the lap. This type of tremor is often seen in patients with Parkinson's disease. Postural tremor occurs when a patient attempts to maintain posture, such as holding the hands outstretched. Postural tremors include physiological tremor, essential tremor, tremor with basal ganglia disease (also seen in patients with Parkinson's disease), cerebellar postural tremor, tremor with peripheral neuropathy, post-traumatic tremor, and alcoholic tremor. Kinetic or intention (action) tremor occurs during purposeful movement, for example during finger-to-nose testing. Task-specific tremor appears when performing goal-oriented tasks such as handwriting, speaking, or standing. This group consists of primary writing tremor, vocal tremor, and orthostatic tremor. Psychogenic tremor occurs in both older and younger patients. The key feature of this tremor is that it dramatically lessens or disappears when the patient is distracted.  PROGNOSIS  There are some treatment options available for tremor; the appropriate treatment depends on  accurate diagnosis of the cause. Some tremors respond to treatment of the underlying condition, for example in some cases of psychogenic tremor treating the patient's underlying mental problem may cause the tremor to disappear. Also, patients with tremor due to Parkinson's disease may be treated with Levodopa drug therapy. Symptomatic drug therapy is available for several other tremors as well. For those cases of tremor in which there is no effective drug treatment, physical measures such as teaching the patient to brace the affected limb during the tremor are sometimes useful. Surgical intervention such as thalamotomy or deep brain stimulation may be useful in certain cases.  Document Released: 06/25/2002 Document Revised: 09/27/2011 Document Reviewed: 07/05/2005  Exit[employer]® Patient Information ©2014 [employer].

## 2013-01-10 NOTE — Progress Notes (Signed)
Subjective:    Patient ID: Casey Grimes, male    DOB: 19-Nov-1928, 79 y.o.   MRN: [id number]  HPI  Pt presents to the clinic today with c/o ER followup for shaking. This started on Monday. The shaking was constant. He did not have AMS or confusion. He presented to the ER early Tuesday morning for the same. All labs were normal. He is diabetic, sugar was 300. They gave him Benadryl and the shaking did not subside. They gave him Ativan and the shaking did not subside.  They discharged him with instructions to follow up with neurology. His shaking resolved on its own early this morning. He denies lightheadedness, dizziness, chest pain, chest tightness, shortness of breath, nausea or vomiting. He feels much better now that the shaking has resoled. He has never had symptoms like this in the past. He has no family history of Parkinson's or other tremor disorders that he is aware of.   Review of Systems      Past Medical History  Diagnosis Date  . Gout   . Hypertension   . Renal insufficiency     CKD stage III, last creatinine 6-10 was 3  . Impotence   . Former smoker     quit 1988  . Diabetes mellitus     type II  . H/O: CVA (cardiovascular accident)   . Depression   . Hyperlipidemia   . Osteoarthritis     Current Outpatient Prescriptions  Medication Sig Dispense Refill  . allopurinol (ZYLOPRIM) 300 MG tablet Take 150 mg by mouth daily.        Marland Kitchen amLODipine (NORVASC) 10 MG tablet Take 10 mg by mouth daily.      Marland Kitchen aspirin EC 325 MG tablet Take 325 mg by mouth daily.      Marland Kitchen atorvastatin (LIPITOR) 10 MG tablet Take 10 mg by mouth every morning.      . calcitRIOL (ROCALTROL) 0.25 MCG capsule Take 0.25 mcg by mouth daily.        . carvedilol (COREG) 25 MG tablet Take 25 mg by mouth 2 (two) times daily with a meal.      . cloNIDine (CATAPRES) 0.3 MG tablet Take 1 tablet (0.3 mg total) by mouth 3 (three) times daily.  90 tablet  5  . doxazosin (CARDURA) 4 MG tablet Take 1 tablet (4 mg total)  by mouth at bedtime.  90 tablet  1  . fosinopril (MONOPRIL) 40 MG tablet Take 1 tablet (40 mg total) by mouth daily.  30 tablet  11  . furosemide (LASIX) 80 MG tablet Take 80-160 mg by mouth 2 (two) times daily. 2 tabs in the am, 1 tab in the pm      . glucose blood (ONE TOUCH TEST STRIPS) test strip Use as instructed  100 each  4  . insulin glargine (LANTUS) 100 UNIT/ML injection Inject 65 Units into the skin at bedtime.      . insulin lispro (HUMALOG) 100 UNIT/ML injection Inject 40-45 Units into the skin 3 (three) times daily before meals. Inject 40 units daily before breakfast and lunch, then inject 45 units before supper      . INSULIN SYRINGE .5CC/28G (B-D INS SYR MICROFINE .5CC/28G) 28G X 1/2" 0.5 ML MISC USE 3 PER DAY AS DIRECTED  300 each  1  . potassium chloride SA (K-DUR,KLOR-CON) 20 MEQ tablet Take 1 tablet (20 mEq total) by mouth 2 (two) times daily.  180 tablet  3  . promethazine (PHENERGAN)  25 MG tablet Take 25 mg by mouth every 8 (eight) hours as needed for nausea.      . sodium bicarbonate 650 MG tablet Take 650 mg by mouth 3 (three) times daily.       No current facility-administered medications for this visit.    No Known Allergies  Family History  Problem Relation Age of Onset  . Coronary artery disease Neg Hx     History   Social History  . Marital Status: Married    Spouse Name: Adela Lank    Number of Children: 1  . Years of Education: 6   Occupational History  . retired    Social History Main Topics  . Smoking status: Former Smoker    Quit date: 07/19/1986  . Smokeless tobacco: Former Neurosurgeon  . Alcohol Use: No  . Drug Use: No  . Sexually Active: No   Other Topics Concern  . Not on file   Social History Narrative   6th grade, married - >50 years, 1 son, others (?). Lives with his wife. Work - retired and disabled.     Constitutional: Denies fever, malaise, fatigue, headache or abrupt weight changes.  HEENT: Denies eye pain, eye redness, ear pain,  ringing in the ears, wax buildup, runny nose, nasal congestion, bloody nose, or sore throat. Respiratory: Denies difficulty breathing, shortness of breath, cough or sputum production.   Cardiovascular: Denies chest pain, chest tightness, palpitations or swelling in the hands or feet.  Gastrointestinal:  Denies abdominal pain, bloating, constipation, diarrhea or blood in the stool.  GU: Denies urgency, frequency, pain with urination, burning sensation, blood in urine, odor or discharge. Musculoskeletal: Denies decrease in range of motion, difficulty with gait, muscle pain or joint pain and swelling.  Skin: Denies redness, rashes, lesions or ulcercations.  Neurological: Denies dizziness, difficulty with memory, difficulty with speech  No other specific complaints in a complete review of systems (except as listed in HPI above).  Objective:   Physical Exam   BP 140/60  Pulse 80  Temp(Src) 98.4 F (36.9 C) (Oral)  Wt 235 lb 3.2 oz (106.686 kg)  BMI 33.75 kg/m2  SpO2 95% Wt Readings from Last 3 Encounters:  01/10/13 235 lb 3.2 oz (106.686 kg)  06/19/12 238 lb 1.3 oz (107.992 kg)  05/04/12 238 lb (107.956 kg)    General: Appears his stated age, well developed, well nourished in NAD. HEENT: Head: normal shape and size; Eyes: sclera white, no icterus, conjunctiva pink, PERRLA and EOMs intact; Ears: Tm's gray and intact, normal light reflex; Nose: mucosa pink and moist, septum midline; Throat/Mouth: Teeth present, mucosa pink and moist, no exudate, lesions or ulcerations noted. .  Cardiovascular: Normal rate and rhythm. S1,S2 noted.  No murmur, rubs or gallops noted. No JVD or BLE edema. No carotid bruits noted. Pulmonary/Chest: Normal effort and positive vesicular breath sounds. No respiratory distress. No wheezes, rales or ronchi noted.  Abdomen: Soft and nontender. Normal bowel sounds, no bruits noted. No distention or masses noted. Liver, spleen and kidneys non palpable. Musculoskeletal:  Normal range of motion. No signs of joint swelling. In wheelchair today. Neurological: Alert and oriented. Cranial nerves II-XII intact. Coordination normal. +DTRs bilaterally.   BMET    Component Value Date/Time   NA 137 01/09/2013 1917   K 4.2 01/09/2013 1917   CL 100 01/09/2013 1917   CO2 25 01/09/2013 1917   GLUCOSE 234* 01/09/2013 1917   GLUCOSE 253 12/28/2008 1005   BUN 72* 01/09/2013 1917  CREATININE 3.29* 01/09/2013 1917   CALCIUM 9.6 01/09/2013 1917   GFRNONAA 16* 01/09/2013 1917   GFRAA 19* 01/09/2013 1917    Lipid Panel     Component Value Date/Time   CHOL 96 06/19/2012 1110   TRIG 191.0* 06/19/2012 1110   HDL 29.40* 06/19/2012 1110   CHOLHDL 3 06/19/2012 1110   VLDL 38.2 06/19/2012 1110   LDLCALC 28 06/19/2012 1110    CBC    Component Value Date/Time   WBC 4.4 01/09/2013 1917   RBC 4.21* 01/09/2013 1917   HGB 11.8* 01/09/2013 1917   HCT 35.5* 01/09/2013 1917   PLT 255 01/09/2013 1917   MCV 84.3 01/09/2013 1917   MCH 28.0 01/09/2013 1917   MCHC 33.2 01/09/2013 1917   RDW 15.2 01/09/2013 1917   LYMPHSABS 1.7 01/09/2013 1917   MONOABS 0.4 01/09/2013 1917   EOSABS 0.4 01/09/2013 1917   BASOSABS 0.0 01/09/2013 1917    Hgb A1C Lab Results  Component Value Date   HGBA1C 7.8* 06/19/2012        Assessment & Plan:   Idiopathic tremor, resolved:  Pt would like to monitor symptoms  If return, he would then like referral to neurology Labs reviewed and normal or stable  RTC as needed or if symptoms persist or worsen

## 2013-01-17 ENCOUNTER — Other Ambulatory Visit: Payer: Self-pay | Admitting: Internal Medicine

## 2013-01-18 ENCOUNTER — Encounter: Payer: Self-pay | Admitting: Internal Medicine

## 2013-01-18 ENCOUNTER — Ambulatory Visit (INDEPENDENT_AMBULATORY_CARE_PROVIDER_SITE_OTHER): Payer: PRIVATE HEALTH INSURANCE | Admitting: Internal Medicine

## 2013-01-18 ENCOUNTER — Other Ambulatory Visit (INDEPENDENT_AMBULATORY_CARE_PROVIDER_SITE_OTHER): Payer: PRIVATE HEALTH INSURANCE

## 2013-01-18 VITALS — BP 140/58 | HR 60 | Temp 97.6°F | Ht 70.0 in | Wt 237.0 lb

## 2013-01-18 DIAGNOSIS — R259 Unspecified abnormal involuntary movements: Secondary | ICD-10-CM

## 2013-01-18 DIAGNOSIS — E119 Type 2 diabetes mellitus without complications: Secondary | ICD-10-CM

## 2013-01-18 DIAGNOSIS — G252 Other specified forms of tremor: Secondary | ICD-10-CM

## 2013-01-18 LAB — HEMOGLOBIN A1C: Hgb A1c MFr Bld: 8.1 % — ABNORMAL HIGH (ref 4.6–6.5)

## 2013-01-18 MED ORDER — AMLODIPINE BESYLATE 10 MG PO TABS: 10.0000 mg | ORAL_TABLET | Freq: Every day | ORAL | Status: DC

## 2013-01-18 MED ORDER — CLONIDINE HCL 0.3 MG PO TABS: 0.3000 mg | ORAL_TABLET | Freq: Three times a day (TID) | ORAL | Status: DC

## 2013-01-18 MED ORDER — DOXAZOSIN MESYLATE 4 MG PO TABS: 4.0000 mg | ORAL_TABLET | Freq: Every day | ORAL | Status: DC

## 2013-01-18 MED ORDER — FUROSEMIDE 80 MG PO TABS: 80.0000 mg | ORAL_TABLET | Freq: Two times a day (BID) | ORAL | Status: DC

## 2013-01-18 MED ORDER — CARVEDILOL 25 MG PO TABS: 25.0000 mg | ORAL_TABLET | Freq: Two times a day (BID) | ORAL | Status: DC

## 2013-01-18 MED ORDER — INSULIN GLARGINE 100 UNIT/ML ~~LOC~~ SOLN: 65.0000 [IU] | Freq: Every day | SUBCUTANEOUS | Status: DC

## 2013-01-18 MED ORDER — FOSINOPRIL SODIUM 40 MG PO TABS: 40.0000 mg | ORAL_TABLET | Freq: Every day | ORAL | Status: DC

## 2013-01-18 MED ORDER — ATORVASTATIN CALCIUM 10 MG PO TABS: 10.0000 mg | ORAL_TABLET | Freq: Every morning | ORAL | Status: DC

## 2013-01-18 MED ORDER — POTASSIUM CHLORIDE CRYS ER 20 MEQ PO TBCR: 20.0000 meq | EXTENDED_RELEASE_TABLET | Freq: Two times a day (BID) | ORAL | Status: DC

## 2013-01-18 NOTE — Patient Instructions (Addendum)
Tremors - doesn't seem to have been infection - labs all normal. It may have been a reaction to the promethazine. Fortunately you have done well.  Diabetes - morning readings are great. The after meal readings may be ok. It is more useful to follow the A1C - in December this was good at 7.8% and we will check it today.  Refills will be sent in for you.   Have a safe 4th of July - we are blessed to live in the [place]

## 2013-01-18 NOTE — Progress Notes (Signed)
Subjective:    Patient ID: Casey Grimes, male    DOB: [birth year], 79 y.o.   MRN: [id number]  HPI Seen June 24th at ED for new onset tremors. He had been feeling pretty well. He had been taking promethazine x 20 doses for nausea. He had no signs of sickness otherwise: no fever, n chills, no dysuria or cough. Reviewed ED notes and labs: no findings per ED MD exam and labs were normal. The tremors resolved spontaneously and have not returned. In the interval since the 24th he has been feeling fine.  His blood sugars are variable but most AM CBGs are in the 80-100 range. Last A1c 7.8 % in Dec '13.  PMH, FamHx and SocHx reviewed for any changes and relevance. Current Outpatient Prescriptions on File Prior to Visit  Medication Sig Dispense Refill  . allopurinol (ZYLOPRIM) 300 MG tablet Take 150 mg by mouth daily.        Marland Kitchen amLODipine (NORVASC) 10 MG tablet Take 10 mg by mouth daily.      Marland Kitchen aspirin EC 325 MG tablet Take 325 mg by mouth daily.      Marland Kitchen atorvastatin (LIPITOR) 10 MG tablet Take 10 mg by mouth every morning.      . calcitRIOL (ROCALTROL) 0.25 MCG capsule Take 0.25 mcg by mouth daily.        . carvedilol (COREG) 25 MG tablet Take 25 mg by mouth 2 (two) times daily with a meal.      . cloNIDine (CATAPRES) 0.3 MG tablet Take 1 tablet (0.3 mg total) by mouth 3 (three) times daily.  90 tablet  5  . doxazosin (CARDURA) 4 MG tablet Take 1 tablet (4 mg total) by mouth at bedtime.  90 tablet  1  . fosinopril (MONOPRIL) 40 MG tablet TAKE ONE (1) TABLET EACH DAY  30 tablet  11  . furosemide (LASIX) 80 MG tablet Take 80-160 mg by mouth 2 (two) times daily. 2 tabs in the am, 1 tab in the pm      . glucose blood (ONE TOUCH TEST STRIPS) test strip Use as instructed  100 each  4  . insulin lispro (HUMALOG) 100 UNIT/ML injection Inject 40-45 Units into the skin 3 (three) times daily before meals. Inject 40 units daily before breakfast and lunch, then inject 45 units before supper      . INSULIN SYRINGE  .5CC/28G (B-D INS SYR MICROFINE .5CC/28G) 28G X 1/2" 0.5 ML MISC USE 3 PER DAY AS DIRECTED  300 each  1  . LANTUS 100 UNIT/ML injection INJECT 65 UNITS INTO THE SKIN AT BEDTIME  10 mL  11  . potassium chloride SA (K-DUR,KLOR-CON) 20 MEQ tablet Take 1 tablet (20 mEq total) by mouth 2 (two) times daily.  180 tablet  3  . promethazine (PHENERGAN) 25 MG tablet Take 25 mg by mouth every 8 (eight) hours as needed for nausea.      . sodium bicarbonate 650 MG tablet Take 650 mg by mouth 3 (three) times daily.       No current facility-administered medications on file prior to visit.      Review of Systems System review is negative for any constitutional, cardiac, pulmonary, GI or neuro symptoms or complaints other than as described in the HPI.      Objective:   Physical Exam Filed Vitals:   01/18/13 1557  BP: 140/58  Pulse: 60  Temp: 97.6 F (36.4 C)   Wt Readings from Last 3 Encounters:  01/18/13 237 lb (107.502 kg)  01/10/13 235 lb 3.2 oz (106.686 kg)  06/19/12 238 lb 1.3 oz (107.992 kg)   Gen'l - overweight AA man in no distress HEENT - C&S clear Cor - RRR Pulm - normal respirations Neuro - A&Ox 3, no focal deficits, motor/cerebellar - no tremor, normal gait - a bit of shuffling gait.       Assessment & Plan:  Tremors - ED record reviewed. Negative work-up. Symptoms did resolve and have not recurred. It is possible he had a reaction to promethazine.  Plan No further work-up unless he has recurrent symptoms.

## 2013-01-21 ENCOUNTER — Other Ambulatory Visit: Payer: Self-pay | Admitting: Internal Medicine

## 2013-01-21 NOTE — Assessment & Plan Note (Signed)
Home CBG record shows good AM readings; post-prandial readings are a bit high.  Plan A1C  Change in medications as indicated.  Addendum.  Lab Results  Component Value Date   HGBA1C 8.1* 01/18/2013   A bit of a jump. Will have him work on his diet and continue present basal insulin regimen and pre-meal humolog.  Concern is to avoid hypoglycemia in this [age]ogenarian.

## 2013-01-22 ENCOUNTER — Telehealth: Payer: Self-pay

## 2013-01-22 NOTE — Telephone Encounter (Signed)
Patient has been notified of results and did not have any questions or concerns.

## 2013-01-22 NOTE — Telephone Encounter (Signed)
Message copied by Noreene Larsson on Mon Jan 22, 2013  8:29 AM ------      Message from: Jacques Navy      Created: Sun Jan 21, 2013  4:25 PM       Please call patient - A1C has drifted up to 8.1% - please pay close attention to your diet and continue present medications.            thanks ------

## 2013-01-29 ENCOUNTER — Telehealth: Payer: Self-pay | Admitting: *Deleted

## 2013-01-29 NOTE — Telephone Encounter (Signed)
Triage Record Num: [id number] Operator: Casey Grimes Patient Name: Casey Grimes Call Date & Time: 01/09/2013 5:37:24PM Patient Phone: [phone] PCP: Casey Grimes Patient Gender: Male PCP Fax : [phone] Patient DOB: 10-03-1928 Practice Name: [facility] Reason for Call: Caller: Casey Grimes; PCP: Casey Grimes (Adults only); CB#: [phone]; Call regarding Shakes Not Feeling Well Jerks; Onset of shaking and jerks started this morning 06/24 after breakfast and it has not gone. Patient is alert and appropriate. Patient is able to care for self but is having his the shakes in hands and with whole body at times. Blood sugar today- last check BS at 14:00 > 300; BS has been running about 230 for months. Started on new medication for kidneys Sodium Bicarb - take tid. Started on 12/18/12. Patient is afebrile with current BS at 17:53 is 366; Triaged using Diabetes: Control Problem with a dispositiont to see provider in 4 hours due to new or increasing symptoms or glucose out of control as defined by provider and taking meds as prescribed. Also triaged using tremor with a disposition to be seen within 72 hours due to any tremor and no prior evaluation. Care advice given with Casey Grimes urgent care recommended if chooses UC but ED preferred. Wife demonstrated understanding and will take to ER. Protocol(s) Used: Diabetes: Control Problems Recommended Outcome per Protocol: See Provider within 4 hours Reason for Outcome: New or increasing symptoms or glucose out of control as defined by provider or action plan AND taking medications/following therapy as prescribed Care Advice: ~ Another adult should drive. Follow the usual meal plan if possible. Drink extra non-caloric fluids. If unable to eat at all, drink regular soft drinks and juices so that 50 grams of carbohydrates are taken in every 3 to 4 hours. ~ ~ Call EMS 911 if any loss of consciousness, difficult to awaken, slow to respond, or  new onset of confused thinking. Medication Advice: - Tell provider all prescription, nonprescription or alternative medications that you take. - Know possible side effects of medication and what to do If they occur. - Discontinue all nonprescription and alternative medications, especially stimulants, until evaluated by provider. ~ High Blood Sugar Treatment: - Follow action plan. - Adjust medications if instructed to do so in action plan or by provider. - Test blood sugar and ketones more often. - Record blood sugar and ketones in meter log or separate logbook, and take to all provider visits. - Drink extra water and other non-sugar fluids to prevent dehydration when the blood sugar is high and urine ketones are present. ~ 01/09/2013 6:03:21PM Page 1 of 1 [email or web address]

## 2013-02-12 ENCOUNTER — Telehealth: Payer: Self-pay

## 2013-02-12 NOTE — Telephone Encounter (Addendum)
Phone call from patient's wife [phone]) stating she had to call paramedics yesterday because he was not responding when she was trying to wake him up. BS found to be 45. By the time paramedics left BS was up to 103.  She reports patient is doing well today.

## 2013-03-15 ENCOUNTER — Telehealth: Payer: Self-pay | Admitting: *Deleted

## 2013-03-15 NOTE — Telephone Encounter (Signed)
1. Moisturizing lotion of choice 2. Meds: otc loratadine 10 mg twice a day; otc randitidine 150 mg twice a day.

## 2013-03-15 NOTE — Telephone Encounter (Signed)
Adela Lank called states pt is itching very bad on his back, requesting Rx.  Please advise

## 2013-03-16 NOTE — Telephone Encounter (Signed)
Spoke with pt advised of MDs message 

## 2013-03-30 ENCOUNTER — Other Ambulatory Visit: Payer: Self-pay | Admitting: Internal Medicine

## 2013-04-17 ENCOUNTER — Encounter: Payer: Self-pay | Admitting: Internal Medicine

## 2013-04-17 ENCOUNTER — Ambulatory Visit (INDEPENDENT_AMBULATORY_CARE_PROVIDER_SITE_OTHER): Payer: PRIVATE HEALTH INSURANCE | Admitting: Internal Medicine

## 2013-04-17 ENCOUNTER — Other Ambulatory Visit (INDEPENDENT_AMBULATORY_CARE_PROVIDER_SITE_OTHER): Payer: PRIVATE HEALTH INSURANCE

## 2013-04-17 VITALS — BP 124/50 | HR 70 | Temp 98.4°F | Wt 236.0 lb

## 2013-04-17 DIAGNOSIS — Z23 Encounter for immunization: Secondary | ICD-10-CM

## 2013-04-17 DIAGNOSIS — I10 Essential (primary) hypertension: Secondary | ICD-10-CM

## 2013-04-17 DIAGNOSIS — L299 Pruritus, unspecified: Secondary | ICD-10-CM

## 2013-04-17 DIAGNOSIS — E119 Type 2 diabetes mellitus without complications: Secondary | ICD-10-CM

## 2013-04-17 LAB — COMPREHENSIVE METABOLIC PANEL
ALT: 27 U/L (ref 0–53)
Albumin: 3.7 g/dL (ref 3.5–5.2)
BUN: 49 mg/dL — ABNORMAL HIGH (ref 6–23)
CO2: 27 mEq/L (ref 19–32)
Calcium: 9.1 mg/dL (ref 8.4–10.5)
Chloride: 101 mEq/L (ref 96–112)
Creatinine, Ser: 3.5 mg/dL — ABNORMAL HIGH (ref 0.4–1.5)
GFR: 21.85 mL/min — ABNORMAL LOW (ref >60.00)
Potassium: 4.1 mEq/L (ref 3.5–5.1)

## 2013-04-17 LAB — HEPATIC FUNCTION PANEL
AST: 27 U/L (ref 0–37)
Albumin: 3.7 g/dL (ref 3.5–5.2)
Alkaline Phosphatase: 70 U/L (ref 39–117)
Bilirubin, Direct: 0 mg/dL (ref 0.0–0.3)
Total Protein: 7.9 g/dL (ref 6.0–8.3)

## 2013-04-17 NOTE — Progress Notes (Signed)
Subjective:    Patient ID: Casey Grimes, male    DOB: 06-03-1929, 79 y.o.   MRN: [id number]  HPI    Review of Systems     Objective:   Physical Exam        Assessment & Plan:  Patient ID: Casey Grimes, male DOB: [birth year], 79 y.o. MRN: [id number]  HPI  Casey Grimes is an 79 year old man with a history of HTN, diabetes, and renal insufficiency who presents with diffuse intermittent pruritus. Over the past month, Casey Grimes has had daily intermittent pruritis over his arms, legs, thighs, and back. There is no relationship between the pruritus and activities, including showering or bathing. Casey Grimes has noticed no rash or skin changes accompanying the pruritus. He has taken oral ranitidine 150 mg, with minimal effect. Casey Grimes denies any accompanying symptoms including other musculoskeletal pain or swelling, changes in vision, or changes in his bowel movements.  In regards to his chronic HTN and diabetes, he continues to be well-managed on his current medications. He checks his blood pressure weekly at home, and notes that the pressures are "all over the place," but are typically in the 140s. The highest systolic BP he has ever seen is 160. He also checks his blood glucose every 3 days at home. His blood sugars range from 83-269 (including both fasting and post-prandial values). In regards to diabetic healthcare maintenance, patient reports last seeing his opthamologist about 1 year ago. He reports never seeing a podiatrist, but checking his feet himself for any wounds. He is not interested in seeing a podiatrist. He reports not knowing where the healing wounds over his shins arouse from, but reports no falls over the past year.  PMH, FamHx and SocHx reviewed for any changes and relevance.  Review of Systems  Skin: No rashes, no diaphoresis.  Neuro: No changes in vision.  CV: No chest pain, no shortness of breath.  Pulm: No shortness of breath.  GI: No changes in bowel movements.   Endocrine: No polyuria or polydipsia.   Objective:   Physical Exam  Vitals:  Filed Vitals:    04/17/13 1100   BP:  124/50   Pulse:  70   Temp:  98.4 F (36.9 C)    General: Sitting in exam room in no acute distress. Constricted affect.  Skin: No rashes noted. Diffuse, scattered seborrheic keratoses over arms and back bilaterally. No jaundice.  Extremities: Mildly diminished sensation to pinprick on right foot. Some healing wounds over bilateral shins. Toenails are notably for significant onychomycosis. Fingernails are notable for clubbing.  HEENT: Some areas of decreased pigmentation in the anterior ears and across the nose. Arcus senilis.  CV: Regular rate and rhythm, no murmurs or rubs. Radial pulses 2+ bilaterally.  Pulm: Lungs clear to auscultation bilaterally. Symmetric chest expansion.     Current Outpatient Prescriptions on File Prior to Visit   Medication  Sig  Dispense  Refill   .  allopurinol (ZYLOPRIM) 300 MG tablet  Take 150 mg by mouth daily.     Marland Kitchen  amLODipine (NORVASC) 10 MG tablet  TAKE ONE (1) TABLET EACH DAY  30 tablet  5   .  aspirin EC 325 MG tablet  Take 325 mg by mouth daily.     Marland Kitchen  atorvastatin (LIPITOR) 10 MG tablet  TAKE ONE (1) TABLET EACH DAY  90 tablet  2   .  calcitRIOL (ROCALTROL) 0.25 MCG capsule  Take 0.25 mcg by mouth daily.     Marland Kitchen  carvedilol (COREG) 25 MG tablet  Take 1 tablet (25 mg total) by mouth 2 (two) times daily with a meal.  60 tablet  5   .  cloNIDine (CATAPRES) 0.3 MG tablet  Take 1 tablet (0.3 mg total) by mouth 3 (three) times daily.  90 tablet  5   .  doxazosin (CARDURA) 4 MG tablet  Take 1 tablet (4 mg total) by mouth at bedtime.  90 tablet  1   .  fosinopril (MONOPRIL) 40 MG tablet  Take 1 tablet (40 mg total) by mouth daily.  30 tablet  5   .  furosemide (LASIX) 80 MG tablet  Take 1-2 tablets (80-160 mg total) by mouth 2 (two) times daily. 2 tabs in the am, 1 tab in the pm  90 tablet  5   .  glucose blood (ONE TOUCH TEST STRIPS) test  strip  Use as instructed  100 each  4   .  insulin glargine (LANTUS) 100 UNIT/ML injection  Inject 0.65 mLs (65 Units total) into the skin at bedtime.  10 mL  11   .  insulin lispro (HUMALOG) 100 UNIT/ML injection  Inject 40-45 Units into the skin 3 (three) times daily before meals. Inject 40 units daily before breakfast and lunch, then inject 45 units before supper     .  INSULIN SYRINGE .5CC/28G (B-D INS SYR MICROFINE .5CC/28G) 28G X 1/2" 0.5 ML MISC  USE 3 PER DAY AS DIRECTED  300 each  1   .  potassium chloride SA (K-DUR,KLOR-CON) 20 MEQ tablet  Take 1 tablet (20 mEq total) by mouth 2 (two) times daily.  180 tablet  3   .  promethazine (PHENERGAN) 25 MG tablet  Take 25 mg by mouth every 8 (eight) hours as needed for nausea.     .  sodium bicarbonate 650 MG tablet  Take 650 mg by mouth 3 (three) times daily.      No current facility-administered medications on file prior to visit.    Assessment:    Casey Grimes is an 79 year old male with a history of well-controlled diabetes and HTN who presents with a 1 month history of diffuse intermittent pruritus in the absence of other significant symptoms.   Plan:    1. Pruritus:  Differential includes: hyperbilirubinemia secondary to liver dysfunction or biliary stenosis, polycythemia vera, hypothyroidism, connective tissue disease, systemic inflammation, and hypercholesterolemia.  Diffuse pruritus could indicate a systemic inflammatory process, which could be the result of an autoimmune condition or be secondary to Casey Grimes's long-standing renal dysfunction. The presentation could be consistent with SLE; however, the areas of decreased pigmentation over the nasal bridge is not consistent with the typically erythematous malar rash seen with SLE. Casey Grimes also does not report significant changes in his energy level or weight. Given the absence of other systemic symptoms, hypothyroidism and connective tissue disease are less likely. The lack of jaundice  argues against hyperbilirubinemia as an etiology. Casey Grimes also does not have a significant history of alcohol abuse as a risk factor for cirrhosis or abdominal symptoms suggesting biliary obstruction. Mr. Slocumb does not report aquagenic pruritus associated with polycythemia vera or the other symptoms classic for P. Vera like visual disturbances or GI symptoms.  Plan --Check TSH, T4, B12, LFTs, ANA, CMP, and ESR.  -- Symptomatic treatment includes loratidine 10 mg twice daily as needed and ranitidine 150 mg up to twice daily as needed.  2. HTN  Currently well-controlled with today's  blood pressure of 124/50, and reported systolic pressures in the 140s at home.  --Continue current management with regular blood pressure checks at home.  3. Diabetes mellitus  Currently well-controlled for age with home blood glucoses ranging from 83-269 with home checks of blood glucose every 3 days. Most recent A1c was 8.1 from July, 2014. Currently symptoms of microvascular complications include mildly diminished sensation over right foot, with history of renal insufficiency. Patient is aware of requirement for foot checks to prevent diabetic foot ulcers, and performs these at home.  --Continue current treatment with home insulin.  --Follow-up with opthamologist regarding diabetic eye exams.  4. Healthcare maintenance  -- Flu shot given today.

## 2013-04-17 NOTE — Progress Notes (Deleted)
  Subjective:    Patient ID: Casey Grimes, male    DOB: 1929/02/10, 79 y.o.   MRN: [id number]  HPI    Review of Systems     Objective:   Physical Exam        Assessment & Plan:

## 2013-04-17 NOTE — Assessment & Plan Note (Signed)
Currently well-controlled with today's blood pressure of 124/50, and reported systolic pressures in the 140s at home.  --Continue current management with regular blood pressure checks at home.

## 2013-04-17 NOTE — Assessment & Plan Note (Addendum)
Currently well-controlled for age with home blood glucoses ranging from 83-269 with home checks of blood glucose every 3 days. Most recent A1c was 8.1 from July, 2014. Currently symptoms of microvascular complications include mildly diminished sensation over right foot, with history of renal insufficiency. Patient is aware of requirement for foot checks to prevent diabetic foot ulcers, and performs these at home.  --Continue current treatment with home insulin.  --Follow-up with opthamologist regarding diabetic eye exams.

## 2013-04-17 NOTE — Patient Instructions (Addendum)
Itching - no rash so we will look for some type of metabolic derrangement, such as liver changes or thyroid changes. This will require lab today.  Plan Labs as above  Continue zantac twice day,  Add Loratadine 10 mg twice day  Cool baths or showers.  Itching Itching is a symptom that can be caused by many things. These include skin problems (including infections) as well as some internal diseases.  If the itching is affecting just one area of the body, it is most likely due to a common skin problem, such as:  Poison oak and poison ivy.  Contact dermatitis (skin irritation from a plant, chemicals, fiberglass, detergents, new cosmetic, new jewelry, or other substance).  Fungus (such as athlete's foot, jock itch, or ringworm).  Head lice  Dandruff  Insect bite  Infection (such as Shingles or other virus infections). If the itching is all over (widespread), the possible causes are many. These include:   Dry skin or eczema  Heat rash  Hives  Liver disorders  Kidney disorders TREATMENT  Localized itching   Lubrication of the skin. Use an ointment or cream or other unperfumed moisturizers if the skin is dry. Apply frequently, especially after bathing.  Anti-itch medicines. These medications may help control the urge to scratch. Scratching always makes itching worse and increases the chance of getting an infection.  Cortisone creams and ointments. These help reduce the inflammation.  Antibiotics. Skin infections can cause itching. Topical or oral antibiotics may be needed for 10 to 20 days to get rid of an infection. If you can identify what caused the itching, avoid this substance in the future.  Widespread itching  The following measures may help to relieve itching regardless of the cause:   Wash the skin once with soap to remove irritants.  Bathe in tepid water with baking soda, cornstarch, or oatmeal.  Use calamine lotion (nonprescription) or a baking soda solution (1  teaspoon in 4 ounces of water on the skin).  Apply 1% hydrocortisone cream (no prescription needed). Do not use this if there might be a skin infection.  Avoid scratching.  Avoid itchy or tight-fitting clothes.  Avoid excessive heat, sweating, scented soaps, and swimming pools.  The lubricants, anti-itch medicines, etc. noted above may be helpful for controlling symptoms. SEEK MEDICAL CARE IF:   The itching becomes severe.  Your itch is not better after 1 week of treatment. Contact your caregiver to schedule further evaluation. Document Released: 07/05/2005 Document Revised: 09/27/2011 Document Reviewed: 12/23/2006 [facility] Patient Information 2014 [place], [place].

## 2013-04-20 ENCOUNTER — Encounter: Payer: Self-pay | Admitting: Internal Medicine

## 2013-05-06 ENCOUNTER — Emergency Department (HOSPITAL_COMMUNITY): Payer: Medicare Other

## 2013-05-06 ENCOUNTER — Inpatient Hospital Stay (HOSPITAL_COMMUNITY)
Admission: EM | Admit: 2013-05-06 | Discharge: 2013-05-10 | DRG: 074 | Disposition: A | Discharge status: Skilled Nursing Facility | Payer: Medicare Other | Attending: Internal Medicine | Admitting: Internal Medicine

## 2013-05-06 ENCOUNTER — Encounter (HOSPITAL_COMMUNITY): Payer: Self-pay | Admitting: Emergency Medicine

## 2013-05-06 DIAGNOSIS — R0789 Other chest pain: Secondary | ICD-10-CM

## 2013-05-06 DIAGNOSIS — R262 Difficulty in walking, not elsewhere classified: Secondary | ICD-10-CM | POA: Diagnosis present

## 2013-05-06 DIAGNOSIS — I129 Hypertensive chronic kidney disease with stage 1 through stage 4 chronic kidney disease, or unspecified chronic kidney disease: Secondary | ICD-10-CM | POA: Diagnosis present

## 2013-05-06 DIAGNOSIS — I10 Essential (primary) hypertension: Secondary | ICD-10-CM

## 2013-05-06 DIAGNOSIS — M48061 Spinal stenosis, lumbar region without neurogenic claudication: Secondary | ICD-10-CM | POA: Diagnosis present

## 2013-05-06 DIAGNOSIS — K3184 Gastroparesis: Secondary | ICD-10-CM | POA: Diagnosis present

## 2013-05-06 DIAGNOSIS — E785 Hyperlipidemia, unspecified: Secondary | ICD-10-CM | POA: Diagnosis present

## 2013-05-06 DIAGNOSIS — E1149 Type 2 diabetes mellitus with other diabetic neurological complication: Secondary | ICD-10-CM

## 2013-05-06 DIAGNOSIS — R52 Pain, unspecified: Secondary | ICD-10-CM | POA: Diagnosis present

## 2013-05-06 DIAGNOSIS — Z79899 Other long term (current) drug therapy: Secondary | ICD-10-CM

## 2013-05-06 DIAGNOSIS — E1121 Type 2 diabetes mellitus with diabetic nephropathy: Secondary | ICD-10-CM | POA: Diagnosis present

## 2013-05-06 DIAGNOSIS — N184 Chronic kidney disease, stage 4 (severe): Secondary | ICD-10-CM | POA: Diagnosis present

## 2013-05-06 DIAGNOSIS — R209 Unspecified disturbances of skin sensation: Secondary | ICD-10-CM | POA: Diagnosis present

## 2013-05-06 DIAGNOSIS — Z87891 Personal history of nicotine dependence: Secondary | ICD-10-CM

## 2013-05-06 DIAGNOSIS — M79609 Pain in unspecified limb: Secondary | ICD-10-CM

## 2013-05-06 DIAGNOSIS — Z8673 Personal history of transient ischemic attack (TIA), and cerebral infarction without residual deficits: Secondary | ICD-10-CM

## 2013-05-06 DIAGNOSIS — R112 Nausea with vomiting, unspecified: Secondary | ICD-10-CM | POA: Diagnosis not present

## 2013-05-06 DIAGNOSIS — E119 Type 2 diabetes mellitus without complications: Secondary | ICD-10-CM | POA: Diagnosis present

## 2013-05-06 DIAGNOSIS — Z794 Long term (current) use of insulin: Secondary | ICD-10-CM

## 2013-05-06 DIAGNOSIS — R259 Unspecified abnormal involuntary movements: Secondary | ICD-10-CM | POA: Diagnosis not present

## 2013-05-06 DIAGNOSIS — M545 Low back pain: Secondary | ICD-10-CM

## 2013-05-06 DIAGNOSIS — R32 Unspecified urinary incontinence: Secondary | ICD-10-CM | POA: Diagnosis present

## 2013-05-06 DIAGNOSIS — M1990 Unspecified osteoarthritis, unspecified site: Secondary | ICD-10-CM | POA: Diagnosis present

## 2013-05-06 DIAGNOSIS — M109 Gout, unspecified: Secondary | ICD-10-CM | POA: Diagnosis present

## 2013-05-06 DIAGNOSIS — G609 Hereditary and idiopathic neuropathy, unspecified: Principal | ICD-10-CM | POA: Diagnosis present

## 2013-05-06 LAB — CBC
HCT: 36.4 % — ABNORMAL LOW (ref 39.0–52.0)
Hemoglobin: 12.5 g/dL — ABNORMAL LOW (ref 13.0–17.0)
Platelets: 260 10*3/uL (ref 150–400)
RDW: 15.3 % (ref 11.5–15.5)
WBC: 6 10*3/uL (ref 4.0–10.5)

## 2013-05-06 LAB — BASIC METABOLIC PANEL
BUN: 49 mg/dL — ABNORMAL HIGH (ref 6–23)
Chloride: 98 mEq/L (ref 96–112)
GFR calc Af Amer: 19 mL/min — ABNORMAL LOW (ref >90)
Glucose, Bld: 193 mg/dL — ABNORMAL HIGH (ref 70–99)
Potassium: 4.5 mEq/L (ref 3.5–5.1)

## 2013-05-06 LAB — URIC ACID: Uric Acid, Serum: 7 mg/dL (ref 4.0–7.8)

## 2013-05-06 LAB — GLUCOSE, CAPILLARY: Glucose-Capillary: 110 mg/dL — ABNORMAL HIGH (ref 70–99)

## 2013-05-06 MED ORDER — ONDANSETRON HCL 4 MG PO TABS: 4.0000 mg | ORAL_TABLET | Freq: Four times a day (QID) | ORAL | Status: DC | PRN

## 2013-05-06 MED ORDER — OXYCODONE-ACETAMINOPHEN 5-325 MG PO TABS
2.0000 | ORAL_TABLET | Freq: Once | ORAL | Status: AC
  Administered 2013-05-06: 2 via ORAL
  Filled 2013-05-06: qty 2

## 2013-05-06 MED ORDER — ATORVASTATIN CALCIUM 10 MG PO TABS
10.0000 mg | ORAL_TABLET | Freq: Every day | ORAL | Status: DC
  Administered 2013-05-07 – 2013-05-09 (×3): 10 mg via ORAL
  Filled 2013-05-06 (×4): qty 1

## 2013-05-06 MED ORDER — POTASSIUM CHLORIDE CRYS ER 20 MEQ PO TBCR
20.0000 meq | EXTENDED_RELEASE_TABLET | Freq: Two times a day (BID) | ORAL | Status: DC
  Administered 2013-05-06 – 2013-05-10 (×8): 20 meq via ORAL
  Filled 2013-05-06 (×10): qty 1

## 2013-05-06 MED ORDER — ACETAMINOPHEN 650 MG RE SUPP: 650.0000 mg | Freq: Four times a day (QID) | RECTAL | Status: DC | PRN

## 2013-05-06 MED ORDER — FUROSEMIDE 80 MG PO TABS: 80.0000 mg | ORAL_TABLET | Freq: Two times a day (BID) | ORAL | Status: DC

## 2013-05-06 MED ORDER — LISINOPRIL 40 MG PO TABS
40.0000 mg | ORAL_TABLET | Freq: Every day | ORAL | Status: DC
  Administered 2013-05-07 – 2013-05-10 (×4): 40 mg via ORAL
  Filled 2013-05-06 (×4): qty 1

## 2013-05-06 MED ORDER — FUROSEMIDE 80 MG PO TABS
160.0000 mg | ORAL_TABLET | Freq: Every day | ORAL | Status: DC
  Administered 2013-05-07 – 2013-05-10 (×4): 160 mg via ORAL
  Filled 2013-05-06 (×4): qty 2

## 2013-05-06 MED ORDER — SODIUM BICARBONATE 650 MG PO TABS
650.0000 mg | ORAL_TABLET | Freq: Three times a day (TID) | ORAL | Status: DC
  Administered 2013-05-06 – 2013-05-10 (×11): 650 mg via ORAL
  Filled 2013-05-06 (×13): qty 1

## 2013-05-06 MED ORDER — FUROSEMIDE 80 MG PO TABS
80.0000 mg | ORAL_TABLET | Freq: Every day | ORAL | Status: DC
  Administered 2013-05-07 – 2013-05-09 (×3): 80 mg via ORAL
  Filled 2013-05-06 (×4): qty 1

## 2013-05-06 MED ORDER — METHOCARBAMOL 500 MG PO TABS
500.0000 mg | ORAL_TABLET | Freq: Three times a day (TID) | ORAL | Status: DC
  Administered 2013-05-06 – 2013-05-10 (×10): 500 mg via ORAL
  Filled 2013-05-06 (×13): qty 1

## 2013-05-06 MED ORDER — HYDROMORPHONE HCL PF 1 MG/ML IJ SOLN
1.0000 mg | Freq: Once | INTRAMUSCULAR | Status: AC
  Administered 2013-05-06: 1 mg via INTRAMUSCULAR
  Filled 2013-05-06: qty 1

## 2013-05-06 MED ORDER — CALCITRIOL 0.25 MCG PO CAPS
0.2500 ug | ORAL_CAPSULE | Freq: Every day | ORAL | Status: DC
  Administered 2013-05-07 – 2013-05-10 (×4): 0.25 ug via ORAL
  Filled 2013-05-06 (×4): qty 1

## 2013-05-06 MED ORDER — INSULIN GLARGINE 100 UNIT/ML ~~LOC~~ SOLN
65.0000 [IU] | Freq: Every day | SUBCUTANEOUS | Status: DC
  Administered 2013-05-06 – 2013-05-09 (×3): 65 [IU] via SUBCUTANEOUS
  Filled 2013-05-06 (×5): qty 0.65

## 2013-05-06 MED ORDER — HYDROCODONE-ACETAMINOPHEN 5-325 MG PO TABS
1.0000 | ORAL_TABLET | ORAL | Status: DC | PRN
  Administered 2013-05-06 – 2013-05-08 (×3): 2 via ORAL
  Administered 2013-05-09: 1 via ORAL
  Filled 2013-05-06: qty 1
  Filled 2013-05-06 (×3): qty 2

## 2013-05-06 MED ORDER — CLONIDINE HCL 0.3 MG PO TABS
0.3000 mg | ORAL_TABLET | Freq: Three times a day (TID) | ORAL | Status: DC
  Administered 2013-05-06 – 2013-05-10 (×11): 0.3 mg via ORAL
  Filled 2013-05-06 (×14): qty 1

## 2013-05-06 MED ORDER — ONDANSETRON HCL 4 MG/2ML IJ SOLN
4.0000 mg | Freq: Four times a day (QID) | INTRAMUSCULAR | Status: DC | PRN
  Administered 2013-05-07 (×3): 4 mg via INTRAVENOUS
  Filled 2013-05-06 (×3): qty 2

## 2013-05-06 MED ORDER — CYCLOBENZAPRINE HCL 10 MG PO TABS
5.0000 mg | ORAL_TABLET | Freq: Once | ORAL | Status: AC
  Administered 2013-05-06: 5 mg via ORAL
  Filled 2013-05-06: qty 1

## 2013-05-06 MED ORDER — INSULIN ASPART 100 UNIT/ML ~~LOC~~ SOLN
0.0000 [IU] | Freq: Three times a day (TID) | SUBCUTANEOUS | Status: DC
  Administered 2013-05-07: 1 [IU] via SUBCUTANEOUS
  Administered 2013-05-08 – 2013-05-09 (×6): 2 [IU] via SUBCUTANEOUS
  Administered 2013-05-10: 9 [IU] via SUBCUTANEOUS
  Administered 2013-05-10: 2 [IU] via SUBCUTANEOUS

## 2013-05-06 MED ORDER — ALUM & MAG HYDROXIDE-SIMETH 200-200-20 MG/5ML PO SUSP
30.0000 mL | Freq: Four times a day (QID) | ORAL | Status: DC | PRN
  Administered 2013-05-07: 30 mL via ORAL
  Filled 2013-05-06: qty 30

## 2013-05-06 MED ORDER — FOSINOPRIL SODIUM 20 MG PO TABS: 40.0000 mg | ORAL_TABLET | Freq: Every day | ORAL | Status: DC

## 2013-05-06 MED ORDER — MORPHINE SULFATE 2 MG/ML IJ SOLN
2.0000 mg | INTRAMUSCULAR | Status: DC | PRN
  Administered 2013-05-06 – 2013-05-07 (×2): 2 mg via INTRAVENOUS
  Filled 2013-05-06 (×2): qty 1

## 2013-05-06 MED ORDER — DOXAZOSIN MESYLATE 4 MG PO TABS
4.0000 mg | ORAL_TABLET | Freq: Every day | ORAL | Status: DC
  Administered 2013-05-06 – 2013-05-09 (×4): 4 mg via ORAL
  Filled 2013-05-06 (×7): qty 1

## 2013-05-06 MED ORDER — CARVEDILOL 25 MG PO TABS
25.0000 mg | ORAL_TABLET | Freq: Two times a day (BID) | ORAL | Status: DC
  Administered 2013-05-07 – 2013-05-10 (×7): 25 mg via ORAL
  Filled 2013-05-06 (×9): qty 1

## 2013-05-06 MED ORDER — INSULIN ASPART 100 UNIT/ML ~~LOC~~ SOLN: 0.0000 [IU] | Freq: Every day | SUBCUTANEOUS | Status: DC

## 2013-05-06 MED ORDER — ASPIRIN EC 325 MG PO TBEC
325.0000 mg | DELAYED_RELEASE_TABLET | Freq: Every day | ORAL | Status: DC
  Administered 2013-05-07 – 2013-05-10 (×4): 325 mg via ORAL
  Filled 2013-05-06 (×5): qty 1

## 2013-05-06 MED ORDER — HEPARIN SODIUM (PORCINE) 5000 UNIT/ML IJ SOLN
5000.0000 [IU] | Freq: Three times a day (TID) | INTRAMUSCULAR | Status: DC
  Administered 2013-05-06 – 2013-05-07 (×2): 5000 [IU] via SUBCUTANEOUS
  Filled 2013-05-06 (×5): qty 1

## 2013-05-06 MED ORDER — AMLODIPINE BESYLATE 10 MG PO TABS
10.0000 mg | ORAL_TABLET | Freq: Every day | ORAL | Status: DC
  Administered 2013-05-07 – 2013-05-10 (×4): 10 mg via ORAL
  Filled 2013-05-06 (×4): qty 1

## 2013-05-06 MED ORDER — ACETAMINOPHEN 325 MG PO TABS
650.0000 mg | ORAL_TABLET | Freq: Four times a day (QID) | ORAL | Status: DC | PRN
  Administered 2013-05-06: 650 mg via ORAL
  Filled 2013-05-06: qty 2

## 2013-05-06 MED ORDER — ALLOPURINOL 150 MG HALF TABLET
150.0000 mg | ORAL_TABLET | Freq: Every day | ORAL | Status: DC
  Administered 2013-05-07 – 2013-05-10 (×4): 150 mg via ORAL
  Filled 2013-05-06 (×4): qty 1

## 2013-05-06 NOTE — Progress Notes (Signed)
Pt was unable to hold urinal and voided in bed. Amount was sufficient to soak through bed pad and sheet. External condom catheter placed in order to monitor output. Pt denies any bladder pressure or need to void. He was able to get out of the bed and stand at bedside while sheets were changed. C/o back pain when moving, but not while standing. States he is feeling much better now than when he arrived at ED.

## 2013-05-06 NOTE — H&P (Addendum)
[facility] History and Physical  Casey Grimes ZOX:[id number] DOB: 12-31-1928 DOA: 05/06/2013  Referring physician: Gwendolyn Grant PCP: Illene Regulus, MD  Specialists:   Chief Complaint: Low back pain  HPI: Casey Grimes is a 79 y.o. male with past medical history of of hypertension, CKD stage III, diabetes mellitus type 2 and history of gout. Patient given to the hospital because of acute back pain. Patient said the pain started about a week ago, his lower back, denies any radiation, denies any weakness in his lower extremity, denies any problems with going to the bathroom or decrease in sensation. Patient was trying to tough the pain out but it was getting progressively worse, for the past 3 days he was barely out of bed. His wife according to the hospital today for further evaluation. In the ED CT scan was done and patient was given pain medications but he was unable to ambulate, MRI was done and showed no acute findings. Patient admitted to the hospital because of inability to ambulate.   Review of Systems:  Constitutional: negative for anorexia, fevers and sweats Eyes: negative for irritation, redness and visual disturbance Ears, nose, mouth, throat, and face: negative for earaches, epistaxis, nasal congestion and sore throat Respiratory: negative for cough, dyspnea on exertion, sputum and wheezing Cardiovascular: negative for chest pain, dyspnea, lower extremity edema, orthopnea, palpitations and syncope Gastrointestinal: negative for abdominal pain, constipation, diarrhea, melena, nausea and vomiting Genitourinary:negative for dysuria, frequency and hematuria Hematologic/lymphatic: negative for bleeding, easy bruising and lymphadenopathy Musculoskeletal: acute back pain Neurological: negative for coordination problems, gait problems, headaches and weakness Endocrine: negative for diabetic symptoms including polydipsia, polyuria and weight loss Allergic/Immunologic: negative for  anaphylaxis, hay fever and urticaria   Past Medical History  Diagnosis Date  . Gout   . Hypertension   . Renal insufficiency     CKD stage III, last creatinine 6-10 was 3  . Impotence   . Former smoker     quit 1988  . Diabetes mellitus     type II  . H/O: CVA (cardiovascular accident)   . Depression   . Hyperlipidemia   . Osteoarthritis    Past Surgical History  Procedure Laterality Date  . Back surgery    . Small lesion excised from back of right hand    . Lower atrial doppler  02-13-04  . Electrocardiogram  08-04-99  . Stress cardiolite  08-13-99   Social History:  reports that he quit smoking about 26 years ago. He has quit using smokeless tobacco. He reports that he does not drink alcohol or use illicit drugs.  No Known Allergies  Family History  Problem Relation Age of Onset  . Coronary artery disease Neg Hx    Prior to Admission medications   Medication Sig Start Date End Date Taking? Authorizing Provider  allopurinol (ZYLOPRIM) 300 MG tablet Take 150 mg by mouth daily.     Yes Historical Provider, MD  amLODipine (NORVASC) 10 MG tablet Take 10 mg by mouth daily.   Yes Historical Provider, MD  aspirin EC 325 MG tablet Take 325 mg by mouth daily.   Yes Historical Provider, MD  atorvastatin (LIPITOR) 10 MG tablet Take 10 mg by mouth daily.   Yes Historical Provider, MD  calcitRIOL (ROCALTROL) 0.25 MCG capsule Take 0.25 mcg by mouth daily.     Yes Historical Provider, MD  carvedilol (COREG) 25 MG tablet Take 25 mg by mouth 2 (two) times daily with a meal.   Yes Historical Provider,  MD  cloNIDine (CATAPRES) 0.3 MG tablet Take 0.3 mg by mouth 3 (three) times daily.   Yes Historical Provider, MD  doxazosin (CARDURA) 4 MG tablet Take 4 mg by mouth at bedtime.   Yes Historical Provider, MD  fosinopril (MONOPRIL) 40 MG tablet Take 40 mg by mouth daily.   Yes Historical Provider, MD  furosemide (LASIX) 80 MG tablet Take 80-160 mg by mouth 2 (two) times daily. Takes 160mg  in the  morning and 80mg  in the evening   Yes Historical Provider, MD  insulin glargine (LANTUS) 100 UNIT/ML injection Inject 65 Units into the skin at bedtime.   Yes Historical Provider, MD  insulin lispro (HUMALOG) 100 UNIT/ML injection Inject 40-45 Units into the skin 3 (three) times daily before meals. Inject 40 units daily before breakfast and lunch, then inject 45 units before supper   Yes Historical Provider, MD  potassium chloride SA (K-DUR,KLOR-CON) 20 MEQ tablet Take 20 mEq by mouth 2 (two) times daily.   Yes Historical Provider, MD  sodium bicarbonate 650 MG tablet Take 650 mg by mouth 3 (three) times daily.   Yes Historical Provider, MD   Physical Exam: Filed Vitals:   05/06/13 1639  BP: 182/64  Pulse: 78  Temp:   Resp:    General appearance: alert, cooperative and no distress  Head: Normocephalic, without obvious abnormality, atraumatic  Eyes: conjunctivae/corneas clear. PERRL, EOM's intact. Fundi benign.  Nose: Nares normal. Septum midline. Mucosa normal. No drainage or sinus tenderness.  Throat: lips, mucosa, and tongue normal; teeth and gums normal  Neck: Supple, no masses, no cervical lymphadenopathy, no JVD appreciated, no meningeal signs Resp: clear to auscultation bilaterally  Chest wall: no tenderness  Cardio: regular rate and rhythm, S1, S2 normal, no murmur, click, rub or gallop  GI: soft, non-tender; bowel sounds normal; no masses, no organomegaly  Extremities: extremities normal, atraumatic, no cyanosis or edema  Skin: Skin color, texture, turgor normal. No rashes or lesions  Back: Tender paraspinal muscles in the lower back and Neurologic: Alert and oriented X 3, normal strength and tone. Normal symmetric reflexes. Normal coordination and gait  Labs on Admission:  Basic Metabolic Panel:  Recent Labs Lab 05/06/13 1351  NA 135  K 4.5  CL 98  CO2 26  GLUCOSE 193*  BUN 49*  CREATININE 3.18*  CALCIUM 9.7   Liver Function Tests: No results found for this  basename: AST, ALT, ALKPHOS, BILITOT, PROT, ALBUMIN,  in the last 168 hours No results found for this basename: LIPASE, AMYLASE,  in the last 168 hours No results found for this basename: AMMONIA,  in the last 168 hours CBC:  Recent Labs Lab 05/06/13 1351  WBC 6.0  HGB 12.5*  HCT 36.4*  MCV 85.0  PLT 260   Cardiac Enzymes: No results found for this basename: CKTOTAL, CKMB, CKMBINDEX, TROPONINI,  in the last 168 hours  BNP (last 3 results) No results found for this basename: PROBNP,  in the last 8760 hours CBG: No results found for this basename: GLUCAP,  in the last 168 hours  Radiological Exams on Admission: Ct Lumbar Spine Wo Contrast  05/06/2013   CLINICAL DATA:  Severe low back pain.  EXAM: CT LUMBAR SPINE WITHOUT CONTRAST  TECHNIQUE: Multidetector CT imaging of the lumbar spine was performed without intravenous contrast administration. Multiplanar CT image reconstructions were also generated.  COMPARISON:  None.  FINDINGS: Normal overall alignment of the lumbar vertebral bodies. Scattered hemangiomas are noted mainly involving L2. Moderate degenerative disc  disease at L4-5 and L5-S1 and moderate lower lumbar facet disease. No acute bony findings or destructive bony changes.  No significant paraspinal or retroperitoneal process is identified. There are atherosclerotic calcifications involving the aorta but no aneurysm.  L1-2: Diffuse bulging annulus, short pedicles and facet disease contributing to mild to moderate spinal and bilateral lateral recess stenosis.  L2-3: Broad-based bulging annulus, short pedicles and facet disease contributing to mild spinal and bilateral lateral recess stenosis.  L3-4: Bulging annulus with mild calcification. There are also short pedicles and facet disease but no significant spinal stenosis. There is mild bilateral lateral recess stenosis. Mild foraminal encroachment bilaterally also.  L4-5: Wide decompressive laminectomies without spinal or foraminal  stenosis. Moderate facet disease and advanced degenerative disc disease.  L5-S1. Mild degenerative retrolisthesis of L5 with advanced degenerative disc disease. There is a shallow calcified central disc protrusion and epidural lipomatosis. Mild foraminal narrowing bilateral.  IMPRESSION: Normal alignment and no acute bony findings.  Mild to moderate multifactorial spinal and lateral recess stenosis at L1-2 and L2-3.  Postoperative changes at L4-5.  Central calcified disc protrusion at L5-S1 but no significant spinal stenosis. Mild lateral recess encroachment bilaterally.   Electronically Signed   By: Loralie Champagne M.D.   On: 05/06/2013 13:42   Mr Lumbar Spine Wo Contrast  05/06/2013   CLINICAL DATA:  79 year old male with low back pain. New bladder incontinence. Remote history of surgery (20 years ago). Initial encounter.  EXAM: MRI LUMBAR SPINE WITHOUT CONTRAST  TECHNIQUE: Multiplanar, multisequence MR imaging was performed. No intravenous contrast was administered.  COMPARISON:  Lumbar spine CT from the same day 04/1913.  FINDINGS: Normal lumbar segmentation depicted on comparison. Stable vertebral height and alignment with straightening of lordosis. Degenerative endplate changes at L4-L5. No marrow edema or evidence of acute osseous abnormality.  Visualized lower thoracic spinal cord is normal with conus medularis at T12-L1.  The bladder is partially visualized and moderately to severely distended. Otherwise negative visualized abdominal and pelvic viscera. Except for minimal postoperative changes at L4-L5, visualized paraspinal soft tissues are within normal limits.  There is a degree of congenital spinal canal narrowing related to short pedicles. The following superimposed degenerative changes are noted:  T11-T12:  Negative.  T12-L1: Mild facet hypertrophy.  No significant stenosis.  L1-L2: Mild to moderate facet hypertrophy. Overall mild spinal stenosis.  L2-L3: Mild to moderate facet hypertrophy. Mild  epidural lipomatosis. Overall borderline spinal stenosis. Mild left L2 foraminal stenosis.  L3-L4: Mild disc desiccation and circumferential disc bulge. Moderate facet and ligament flavum hypertrophy. Trace facet joint fluid. Mild epidural lipomatosis. No significant stenosis.  L4-L5: Previous partial laminectomy. Moderate to severe disc space loss. Circumferential disc osteophyte complex. Mild architectural distortion at the lateral recesses. Mild epidural lipomatosis. Mild residual facet hypertrophy. No spinal or definite lateral recess stenosis. No significant foraminal stenosis.  L5-S1: Epidural lipomatosis at this level responsible for effaced CSF from the thecal sac. Circumferential disc osteophyte complex. Posterior annular tear. Effaced lateral recesses but no convincing neural impingement. Moderate bilateral facet hypertrophy. Mild left and mild to moderate right L5 foraminal stenosis.  Visible sacrum intact.  IMPRESSION: 1. No acute osseous abnormality in the lumbar spine.  2. Mostly congenital lumbar spinal stenosis is borderline to mild at L1-L2 and L2-L3. Epidural lipomatosis at L5-S1.  3. Remote postoperative changes at L4-L5 with no spinal stenosis or convincing neural impingement.  4. Multifactorial mild to moderate left L2 and bilateral L5 foraminal stenosis.  5. Distended bladder. Consider Foley catheter  placement.   Electronically Signed   By: Augusto Gamble M.D.   On: 05/06/2013 15:47    EKG: Independently reviewed.   Assessment/Plan Principal Problem:   Acute low back pain Active Problems:   DIABETES MELLITUS, TYPE II   Gout   HYPERTENSION   CKD (chronic kidney disease), stage III   Acute low back pain -MRI of the lumbar spine did not show any acute findings. -Patient has history of back surgeries, MRI did not show significant canal stenosis or nerve root pressure. -Was unable to ambulate in the ED even after IV narcotics administration. -Has history of gout, acute gouty arthritis  involvement of the spine is possible but uncommon. -Check uric acid level, I did not start steroids or NSAIDs (has CKD III), cut started in a.m. if suspicion for gout is high. -Admit for observation, pain control, muscle relaxants and PT to see.  Diabetes mellitus type 2 -Continue home medication regimen of Lantus, insulin sliding scale. -Carbohydrate modified diet and check hemoglobin A1c.  Hypertension -Seems to be difficult to control blood pressure, patient is on multiple blood pressure medications. -Antihypertensives include clonidine, amlodipine, Coreg, Monopril, and Lasix. -Patient is on doxazosin, unclear if that for blood pressure or BPH.  CKD stage III -Patient is on high dose of Lasix, seems to be around his baseline, creatinine is 3.1.  Code Status: Full code Family Communication: Plan discussed with the patient Disposition Plan: Observation  Time spent: 70 minutes  [facility] Pager [phone]  If 7PM-7AM, please contact night-coverage [email or web address] Password [email or web address] 05/06/2013, 5:46 PM

## 2013-05-06 NOTE — ED Notes (Signed)
Patient transported to MRI 

## 2013-05-06 NOTE — ED Notes (Signed)
Pt to ED for evaluation of lower back pain for the past week- denies any injury- pt has hx of lower back surgeries in the past.  Pt rates pain 8/10.  CBG 260.

## 2013-05-06 NOTE — ED Provider Notes (Signed)
CSN: [id number]     Arrival date & time 05/06/13  1110 History   First MD Initiated Contact with Patient 05/06/13 1122     Chief Complaint  Patient presents with  . Back Pain   (Consider location/radiation/quality/duration/timing/severity/associated sxs/prior Treatment) Patient is a 79 y.o. male presenting with back pain. The history is provided by the patient.  Back Pain Location:  Lumbar spine Quality:  Aching Radiates to:  Does not radiate Pain severity:  Severe Pain is:  Same all the time Onset quality:  Gradual Duration:  1 week Timing:  Constant Progression:  Worsening Chronicity:  New Context: not falling, not MCA, not MVA, not pedestrian accident, not physical stress, not recent illness and not twisting   Relieved by:  Nothing Worsened by:  Nothing tried Associated symptoms: no abdominal pain and no fever     Past Medical History  Diagnosis Date  . Gout   . Hypertension   . Renal insufficiency     CKD stage III, last creatinine 6-10 was 3  . Impotence   . Former smoker     quit 1988  . Diabetes mellitus     type II  . H/O: CVA (cardiovascular accident)   . Depression   . Hyperlipidemia   . Osteoarthritis    Past Surgical History  Procedure Laterality Date  . Back surgery    . Small lesion excised from back of right hand    . Lower atrial doppler  02-13-04  . Electrocardiogram  08-04-99  . Stress cardiolite  08-13-99   Family History  Problem Relation Age of Onset  . Coronary artery disease Neg Hx    History  Substance Use Topics  . Smoking status: Former Smoker    Quit date: 07/19/1986  . Smokeless tobacco: Former Neurosurgeon  . Alcohol Use: No    Review of Systems  Constitutional: Negative for fever and chills.  Respiratory: Negative for cough and shortness of breath.   Gastrointestinal: Negative for nausea, vomiting and abdominal pain.  Musculoskeletal: Positive for back pain.  All other systems reviewed and are negative.    Allergies  Review  of patient's allergies indicates no known allergies.  Home Medications   Current Outpatient Rx  Name  Route  Sig  Dispense  Refill  . allopurinol (ZYLOPRIM) 300 MG tablet   Oral   Take 150 mg by mouth daily.           Marland Kitchen amLODipine (NORVASC) 10 MG tablet   Oral   Take 10 mg by mouth daily.         Marland Kitchen aspirin EC 325 MG tablet   Oral   Take 325 mg by mouth daily.         Marland Kitchen atorvastatin (LIPITOR) 10 MG tablet   Oral   Take 10 mg by mouth daily.         . calcitRIOL (ROCALTROL) 0.25 MCG capsule   Oral   Take 0.25 mcg by mouth daily.           . carvedilol (COREG) 25 MG tablet   Oral   Take 25 mg by mouth 2 (two) times daily with a meal.         . cloNIDine (CATAPRES) 0.3 MG tablet   Oral   Take 0.3 mg by mouth 3 (three) times daily.         Marland Kitchen doxazosin (CARDURA) 4 MG tablet   Oral   Take 4 mg by mouth at bedtime.         Marland Kitchen  fosinopril (MONOPRIL) 40 MG tablet   Oral   Take 40 mg by mouth daily.         . furosemide (LASIX) 80 MG tablet   Oral   Take 80-160 mg by mouth 2 (two) times daily. Takes 160mg  in the morning and 80mg  in the evening         . insulin glargine (LANTUS) 100 UNIT/ML injection   Subcutaneous   Inject 65 Units into the skin at bedtime.         . insulin lispro (HUMALOG) 100 UNIT/ML injection   Subcutaneous   Inject 40-45 Units into the skin 3 (three) times daily before meals. Inject 40 units daily before breakfast and lunch, then inject 45 units before supper         . potassium chloride SA (K-DUR,KLOR-CON) 20 MEQ tablet   Oral   Take 20 mEq by mouth 2 (two) times daily.         . sodium bicarbonate 650 MG tablet   Oral   Take 650 mg by mouth 3 (three) times daily.          BP 166/62  Pulse 67  Temp(Src) 98.9 F (37.2 C) (Oral)  Resp 20  SpO2 100% Physical Exam  Nursing note and vitals reviewed. Constitutional: He is oriented to person, place, and time. He appears well-developed and well-nourished. No  distress.  HENT:  Head: Normocephalic and atraumatic.  Mouth/Throat: No oropharyngeal exudate.  Eyes: EOM are normal. Pupils are equal, round, and reactive to light.  Neck: Normal range of motion. Neck supple.  Cardiovascular: Normal rate and regular rhythm.  Exam reveals no friction rub.   No murmur heard. Pulmonary/Chest: Effort normal and breath sounds normal. No respiratory distress. He has no wheezes. He has no rales.  Abdominal: He exhibits no distension. There is no tenderness. There is no rebound.  Musculoskeletal: Normal range of motion. He exhibits no edema.       Lumbar back: He exhibits bony tenderness (lower lumbar spine).  Neurological: He is alert and oriented to person, place, and time. No sensory deficit. He exhibits normal muscle tone. GCS eye subscore is 4. GCS verbal subscore is 5. GCS motor subscore is 6.  Reflex Scores:      Patellar reflexes are 2+ on the right side and 2+ on the left side. No clonus bilaterally  Skin: He is not diaphoretic.    ED Course  Procedures (including critical care time) Labs Review Labs Reviewed - No data to display Imaging Review No results found.  EKG Interpretation   None       MDM   1. Acute low back pain   2. CKD (chronic kidney disease), stage III   3. Gout   4. Other chest pain   5. Type II or unspecified type diabetes mellitus without mention of complication, not stated as uncontrolled    79 year old male presents with back pain. Began 1 week ago. He's had a history of low back surgeries in the past, but does not have chronic back pain. He takes Aleve for occasional back pain, but the Aleve does not help with his back pain. He has not fallen or had any trauma. He states it hurts very much to move. He denies any fevers, loss of bowel or bladder control, numbness. On exam, his vitals are stable. He has no abdominal pain. He has decreased strength in bilateral lower sugar is due to the back pain. He has normal reflexes and  normal sensation. He is unable to ambulate due to the pain. We'll start a lumbar spine CT scan as he has a midline lumbar spine tenderness. Pain meds and muscle spasm meds given. I reviewed L-spine films, patient seems to have severe degenerative arthritis. Minimal to no relief with the pain meds. Patient has urinary incontinence after CT scan. Will pursue MRI for further imaging with the incontinence.  MRI normal. Patient has normal postvoid residual after urinating. He is unable to stand on his own accord. I'm concerned patient's ability to move around the house since his wife is much smaller than he is and he is a very large man. I spoke with medicine who will admit for PT evaluation. Patient also not having any improvement in his pain after IM Dilaudid and by mouth Percocet  Dagmar Hait, MD 05/06/13 2043

## 2013-05-07 DIAGNOSIS — E119 Type 2 diabetes mellitus without complications: Secondary | ICD-10-CM

## 2013-05-07 DIAGNOSIS — I129 Hypertensive chronic kidney disease with stage 1 through stage 4 chronic kidney disease, or unspecified chronic kidney disease: Secondary | ICD-10-CM

## 2013-05-07 DIAGNOSIS — M545 Low back pain: Secondary | ICD-10-CM

## 2013-05-07 LAB — GLUCOSE, CAPILLARY
Glucose-Capillary: 101 mg/dL — ABNORMAL HIGH (ref 70–99)
Glucose-Capillary: 95 mg/dL (ref 70–99)
Glucose-Capillary: 118 mg/dL — ABNORMAL HIGH (ref 70–99)

## 2013-05-07 LAB — CBC
Hemoglobin: 11.4 g/dL — ABNORMAL LOW (ref 13.0–17.0)
MCH: 28.1 pg (ref 26.0–34.0)
MCHC: 32.8 g/dL (ref 30.0–36.0)
MCV: 85.7 fL (ref 78.0–100.0)
Platelets: 243 10*3/uL (ref 150–400)
RBC: 4.06 MIL/uL — ABNORMAL LOW (ref 4.22–5.81)
RDW: 15.6 % — ABNORMAL HIGH (ref 11.5–15.5)
WBC: 6 10*3/uL (ref 4.0–10.5)

## 2013-05-07 LAB — BASIC METABOLIC PANEL
BUN: 54 mg/dL — ABNORMAL HIGH (ref 6–23)
CO2: 26 mEq/L (ref 19–32)
Calcium: 9.5 mg/dL (ref 8.4–10.5)
GFR calc non Af Amer: 14 mL/min — ABNORMAL LOW (ref >90)
Glucose, Bld: 172 mg/dL — ABNORMAL HIGH (ref 70–99)
Potassium: 4.3 mEq/L (ref 3.5–5.1)
Sodium: 137 mEq/L (ref 135–145)

## 2013-05-07 LAB — HEMOGLOBIN A1C
Hgb A1c MFr Bld: 7.6 % — ABNORMAL HIGH (ref <5.7)
Mean Plasma Glucose: 171 mg/dL — ABNORMAL HIGH (ref <117)

## 2013-05-07 MED ORDER — ENOXAPARIN SODIUM 40 MG/0.4ML ~~LOC~~ SOLN
40.0000 mg | SUBCUTANEOUS | Status: DC
  Administered 2013-05-08 – 2013-05-10 (×3): 40 mg via SUBCUTANEOUS
  Filled 2013-05-07 (×5): qty 0.4

## 2013-05-07 MED ORDER — GABAPENTIN 600 MG PO TABS
300.0000 mg | ORAL_TABLET | Freq: Three times a day (TID) | ORAL | Status: DC
  Filled 2013-05-07 (×3): qty 0.5

## 2013-05-07 MED ORDER — GABAPENTIN 300 MG PO CAPS
300.0000 mg | ORAL_CAPSULE | Freq: Three times a day (TID) | ORAL | Status: DC
  Administered 2013-05-07 – 2013-05-10 (×10): 300 mg via ORAL
  Filled 2013-05-07 (×12): qty 1

## 2013-05-07 NOTE — Progress Notes (Signed)
Subjective: Casey Grimes has been admitted for inability to ambulate. He had a full eval in The ED including MRI lumbar spine that was unremarkable. He had a 1 week h/o low back pain preceeding his disability.   On questioning he states that when he stands he has severe burning pain in his legs and feet as well as his back. He denies weakness in his legs.  Objective: Lab:  Recent Labs  05/06/13 1351  WBC 6.0  HGB 12.5*  HCT 36.4*  MCV 85.0  PLT 260    Recent Labs  05/06/13 1351  NA 135  K 4.5  CL 98  GLUCOSE 193*  BUN 49*  CREATININE 3.18*  CALCIUM 9.7    Imaging: CT Lumbar spine 05/06/13: IMPRESSION:  Normal alignment and no acute bony findings.  Mild to moderate multifactorial spinal and lateral recess stenosis  at L1-2 and L2-3.  Postoperative changes at L4-5.  Central calcified disc protrusion at L5-S1 but no significant spinal  stenosis. Mild lateral recess encroachment bilaterally.  MRI lumbar spine 05/06/13: IMPRESSION:  1. No acute osseous abnormality in the lumbar spine.  2. Mostly congenital lumbar spinal stenosis is borderline to mild at  L1-L2 and L2-L3. Epidural lipomatosis at L5-S1.  3. Remote postoperative changes at L4-L5 with no spinal stenosis or  convincing neural impingement.  4. Multifactorial mild to moderate left L2 and bilateral L5  foraminal stenosis.  5. Distended bladder. Consider Foley catheter placement.   Scheduled Meds: . allopurinol  150 mg Oral Daily  . amLODipine  10 mg Oral Daily  . aspirin EC  325 mg Oral Daily  . atorvastatin  10 mg Oral q1800  . calcitRIOL  0.25 mcg Oral Daily  . carvedilol  25 mg Oral BID WC  . cloNIDine  0.3 mg Oral TID  . doxazosin  4 mg Oral QHS  . furosemide  160 mg Oral Daily  . furosemide  80 mg Oral q1800  . heparin  5,000 Units Subcutaneous Q8H  . insulin aspart  0-5 Units Subcutaneous QHS  . insulin aspart  0-9 Units Subcutaneous TID WC  . insulin glargine  65 Units Subcutaneous QHS  .  lisinopril  40 mg Oral Daily  . methocarbamol  500 mg Oral TID  . potassium chloride SA  20 mEq Oral BID  . sodium bicarbonate  650 mg Oral TID   Continuous Infusions:  PRN Meds:.acetaminophen, acetaminophen, alum & mag hydroxide-simeth, HYDROcodone-acetaminophen, morphine injection, ondansetron (ZOFRAN) IV, ondansetron   Physical Exam: Filed Vitals:   05/07/13 0552  BP: 138/54  Pulse: 73  Temp: 98.7 F (37.1 C)  Resp: 18    Intake/Output Summary (Last 24 hours) at 05/07/13 0615 Last data filed at 05/07/13 0554  Gross per 24 hour  Intake    460 ml  Output    200 ml  Net    260 ml   Gen'l- heavy set man who looks good for 84 HEENT_ C&S clear Cor - RRR, 2+ DP pulse Pulm - normal respirations Abd -obese, soft umbilical hernia, several injection granulomas abdominal wall, no HSM, no guarding or rebound Ext - no deformity, no joint inflammation, no evidence of gout flare Neuro - A&O x 3, CN - normal facial symmetry, EOMI. MS - able to lift both legs off the bed and offer resistance. Sensation - normal to light touch      Assessment/Plan: 1. Inability to walk - no focal weakness, no imaging evidence of cord or nerve root compression. By  description his pain is a burning dysesthesia c/w profound peripheral neuropathy w/o myopathy  Plan PT eval as ordered  Start gabapentin 300 mg TID  2. DM-  Lab Results  Component Value Date   HGBA1C 8.1* 01/18/2013   His control has been variable but general doing OK  Plan Continue present regimen while in hospital  3. CKD IV - he is followed closely by nephrology as an outpatient.    Illene Regulus [place] (o) [phone]; (c) [phone] Call-grp - [name]  Tele: [phone]  05/07/2013, 6:05 AM

## 2013-05-07 NOTE — Evaluation (Signed)
Occupational Therapy Evaluation and Discharge Patient Details Name: Casey Grimes MRN: [id number] DOB: 1928-10-01 Today's Date: 05/07/2013 Time: 0865-7846 OT Time Calculation (min): 16 min  OT Assessment / Plan / Recommendation History of present illness Casey Grimes is a 79 y.o. male with past medical history of of hypertension, CKD stage III, diabetes mellitus type 2 and history of gout. Patient given to the hospital because of acute back pain. Patient said the pain started about a week ago, his lower back, denies any radiation, denies any weakness in his lower extremity, denies any problems with going to the bathroom or decrease in sensation. Patient was trying to tough the pain out but it was getting progressively worse, for the past 3 days he was barely out of bed. His wife according to the hospital today for further evaluation.   Clinical Impression   This 79 yo male admitted with above presents to acute OT with all education completed, no further OT needs, will sign off.    OT Assessment  Patient does not need any further OT services    Follow Up Recommendations  No OT follow up       Equipment Recommendations  3 in 1 bedside comode          Precautions / Restrictions Precautions Precautions: Back (so as to not aggravate it further) Restrictions Weight Bearing Restrictions: No   Pertinent Vitals/Pain 8/10 at beginning--after ambulation 6/10 in back (lower/medial)    ADL  Eating/Feeding: Independent Where Assessed - Eating/Feeding: Chair Grooming: Set up Where Assessed - Grooming: Supported sitting Upper Body Bathing: Set up Where Assessed - Upper Body Bathing: Supported sitting Lower Body Bathing: Moderate assistance Where Assessed - Lower Body Bathing: Supported sit to stand Upper Body Dressing: Minimal assistance Where Assessed - Upper Body Dressing: Supported sitting Lower Body Dressing: Maximal assistance Where Assessed - Lower Body Dressing: Supported sit to  Pharmacist, hospital: Minimal Dentist Method: Sit to Barista: Raised toilet seat with arms (or 3-in-1 over toilet) Toileting - Clothing Manipulation and Hygiene: Minimal assistance Where Assessed - Engineer, mining and Hygiene: Standing Equipment Used: Gait belt;Rolling walker Transfers/Ambulation Related to ADLs: Min A  for all with RW ADL Comments: Wife or son to A with LB ADLs until pt's back pain subsides and he can do it by himself again        Visit Information  Last OT Received On: 05/07/13 Assistance Needed: +1 PT/OT Co-Evaluation/Treatment: Yes (partial) History of Present Illness: Casey Grimes is a 79 y.o. male with past medical history of of hypertension, CKD stage III, diabetes mellitus type 2 and history of gout. Patient given to the hospital because of acute back pain. Patient said the pain started about a week ago, his lower back, denies any radiation, denies any weakness in his lower extremity, denies any problems with going to the bathroom or decrease in sensation. Patient was trying to tough the pain out but it was getting progressively worse, for the past 3 days he was barely out of bed. His wife according to the hospital today for further evaluation.       Prior Functioning     Home Living Family/patient expects to be discharged to:: Private residence Living Arrangements: Spouse/significant other Available Help at Discharge: Family;Available 24 hours/day Type of Home: House Home Access: Stairs to enter [employer] of Steps: 6 Entrance Stairs-Rails: Right Home Layout: One level Home Equipment: Cane - single point Prior Function Level of Independence:  Independent Communication Communication: No difficulties Dominant Hand: Right         Vision/Perception Vision - History Patient Visual Report: No change from baseline   Cognition  Cognition Arousal/Alertness: Awake/alert Behavior  During Therapy: WFL for tasks assessed/performed Overall Cognitive Status: Within Functional Limits for tasks assessed    Extremity/Trunk Assessment Upper Extremity Assessment Upper Extremity Assessment: Overall WFL for tasks assessed     Mobility Bed Mobility Details for Bed Mobility Assistance: Pt up in recliner upon arrival Transfers Transfers: Sit to Stand;Stand to Sit Sit to Stand: 4: Min assist;With upper extremity assist;With armrests;From chair/3-in-1 Stand to Sit: 4: Min assist;With upper extremity assist;With armrests;To chair/3-in-1 Details for Transfer Assistance: VCs for safe hand placement           End of Session OT - End of Session Equipment Utilized During Treatment: Gait belt;Rolling walker Activity Tolerance: Patient tolerated treatment well Patient left: in chair;with call bell/phone within reach;with family/visitor present Nurse Communication:  (NT already had pt up to recliner)  GO Functional Assessment Tool Used: Clinical observation Functional Limitation: Self care Self Care Current Status (Z6109): At least 60 percent but less than 80 percent impaired, limited or restricted Self Care Goal Status (U0454): At least 60 percent but less than 80 percent impaired, limited or restricted Self Care Discharge Status [id number]): At least 60 percent but less than 80 percent impaired, limited or restricted   Evette Georges [phone] 05/07/2013, 11:55 AM

## 2013-05-07 NOTE — Care Management Utilization Note (Signed)
Utilization review completed. Vivica Dobosz, RN BSN 

## 2013-05-07 NOTE — Progress Notes (Signed)
Hypoglycemic Event  CBG: 69  Treatment: 15 GM carbohydrate snack  Symptoms: None  Follow-up CBG: Time:12:18 CBG Result:101  Possible Reasons for Event: Vomiting  Comments/MD notified:N/A     Casey Grimes  Remember to initiate Hypoglycemia Order Set & complete

## 2013-05-07 NOTE — Evaluation (Signed)
Physical Therapy Evaluation Patient Details Name: Casey Grimes MRN: [id number] DOB: Oct 29, 1928 Today's Date: 05/07/2013 Time: 7829-5621 PT Time Calculation (min): 10 min  PT Assessment / Plan / Recommendation History of Present Illness  Casey Grimes is a 79 y.o. male with past medical history of of hypertension, CKD stage III, diabetes mellitus type 2 and history of gout. Patient given to the hospital because of acute back pain. Patient said the pain started about a week ago, his lower back, denies any radiation, denies any weakness in his lower extremity, denies any problems with going to the bathroom or decrease in sensation. Patient was trying to tough the pain out but it was getting progressively worse, for the past 3 days he was barely out of bed. His wife according to the hospital today for further evaluation.  Clinical Impression  This patient presents with acute pain and decreased independence with functional mobility following onset of back pain. Pt with heating pad in recliner, which he states improves his pain, however he rated his pain 8/10 upon arrival. With ambulation, saw an improvement with floor clearance, as pt could only slide feet along to advance at first, and with continued ambulation, pt states pain improved as well as ability to clear floor and advance LE's without shuffling or sliding. This patient is appropriate for skilled PT interventions to address functional limitations, improve safety and independence with functional mobility, and return to PLOF.    PT Assessment  Patient needs continued PT services    Follow Up Recommendations  Home health PT    Does the patient have the potential to tolerate intense rehabilitation      Barriers to Discharge Inaccessible home environment 6 steps to enter    Equipment Recommendations  Rolling walker with 5" wheels    Recommendations for Other Services     Frequency Min 3X/week    Precautions / Restrictions  Precautions Precautions: Fall;Back (for pain control) Restrictions Weight Bearing Restrictions: No   Pertinent Vitals/Pain 8/10 at rest, 6/10 after ambulation      Mobility  Bed Mobility Bed Mobility: Not assessed (Pt received up in chair) Details for Bed Mobility Assistance: Pt up in recliner upon arrival Transfers Transfers: Sit to Stand;Stand to Sit Sit to Stand: 4: Min assist;From chair/3-in-1;With upper extremity assist Stand to Sit: 4: Min assist;To chair/3-in-1;With upper extremity assist Details for Transfer Assistance: VC's for hand placement on seated surface prior to initiating transfers. Ambulation/Gait Ambulation/Gait Assistance: 4: Min guard Ambulation Distance (Feet): 50 Feet Assistive device: Rolling walker Ambulation/Gait Assistance Details: Pt began ambulation by sliding feet across floor, unable to pick them up to advance. As gait training continued, pt reports decreased pain in his back and pt was able to gradually start clearing the floor more and more.  Gait Pattern: Step-through pattern;Decreased stride length;Shuffle;Trunk flexed Gait velocity: decreased General Gait Details: Poor posture, pt unable to make corrective changes with cueing - possibly due to pain? Stairs: No    Exercises     PT Diagnosis: Difficulty walking;Acute pain  PT Problem List: Decreased strength;Decreased range of motion;Decreased activity tolerance;Decreased balance;Decreased mobility;Decreased knowledge of use of DME;Decreased safety awareness;Pain PT Treatment Interventions: DME instruction;Gait training;Stair training;Functional mobility training;Therapeutic activities;Therapeutic exercise;Neuromuscular re-education;Patient/family education     PT Goals(Current goals can be found in the care plan section) Acute Rehab PT Goals Patient Stated Goal: To return home PT Goal Formulation: With patient/family Time For Goal Achievement: 05/14/13 Potential to Achieve Goals:  Good  Visit Information  Last PT  Received On: 05/07/13 Assistance Needed: +1 History of Present Illness: Casey Grimes is a 79 y.o. male with past medical history of of hypertension, CKD stage III, diabetes mellitus type 2 and history of gout. Patient given to the hospital because of acute back pain. Patient said the pain started about a week ago, his lower back, denies any radiation, denies any weakness in his lower extremity, denies any problems with going to the bathroom or decrease in sensation. Patient was trying to tough the pain out but it was getting progressively worse, for the past 3 days he was barely out of bed. His wife according to the hospital today for further evaluation.       Prior Functioning  Home Living Family/patient expects to be discharged to:: Private residence Living Arrangements: Spouse/significant other Available Help at Discharge: Family;Available 24 hours/day Type of Home: House Home Access: Stairs to enter [employer] of Steps: 6 Entrance Stairs-Rails: Right Home Layout: One level Home Equipment: Cane - single point Prior Function Level of Independence: Independent Communication Communication: No difficulties Dominant Hand: Right    Cognition  Cognition Arousal/Alertness: Awake/alert Behavior During Therapy: WFL for tasks assessed/performed Overall Cognitive Status: Within Functional Limits for tasks assessed    Extremity/Trunk Assessment Upper Extremity Assessment Upper Extremity Assessment: Defer to OT evaluation Lower Extremity Assessment Lower Extremity Assessment: Overall WFL for tasks assessed Cervical / Trunk Assessment Cervical / Trunk Assessment: Kyphotic   Balance    End of Session PT - End of Session Equipment Utilized During Treatment: Gait belt Activity Tolerance: Patient limited by fatigue Patient left: in chair;with call bell/phone within reach;with family/visitor present  GP Functional Assessment Tool Used: Clinical  Judgement Functional Limitation: Mobility: Walking and moving around Mobility: Walking and Moving Around Current Status (Z6109): At least 20 percent but less than 40 percent impaired, limited or restricted Mobility: Walking and Moving Around Goal Status [id number]): At least 20 percent but less than 40 percent impaired, limited or restricted   Ruthann Cancer 05/07/2013, 12:31 PM  Ruthann Cancer, PT, DPT Acute Rehabilitation Services [phone]

## 2013-05-08 ENCOUNTER — Observation Stay (HOSPITAL_COMMUNITY): Payer: Medicare Other

## 2013-05-08 ENCOUNTER — Telehealth: Payer: Self-pay

## 2013-05-08 DIAGNOSIS — I10 Essential (primary) hypertension: Secondary | ICD-10-CM

## 2013-05-08 DIAGNOSIS — M79609 Pain in unspecified limb: Secondary | ICD-10-CM

## 2013-05-08 DIAGNOSIS — R112 Nausea with vomiting, unspecified: Secondary | ICD-10-CM

## 2013-05-08 LAB — GLUCOSE, CAPILLARY: Glucose-Capillary: 172 mg/dL — ABNORMAL HIGH (ref 70–99)

## 2013-05-08 MED ORDER — PROMETHAZINE HCL 25 MG/ML IJ SOLN
12.5000 mg | Freq: Four times a day (QID) | INTRAMUSCULAR | Status: DC | PRN
  Administered 2013-05-08 (×2): 12.5 mg via INTRAVENOUS
  Filled 2013-05-08: qty 1

## 2013-05-08 NOTE — Progress Notes (Signed)
Physical Therapy Treatment Patient Details Name: Casey Grimes MRN: [id number] DOB: [birth year] Today's Date: 05/08/2013 Time: 4098-1191 PT Time Calculation (min): 16 min  PT Assessment / Plan / Recommendation  History of Present Illness Casey Grimes is a 79 y.o. male with past medical history of of hypertension, CKD stage III, diabetes mellitus type 2 and history of gout. Patient given to the hospital because of acute back pain. Patient said the pain started about a week ago, his lower back, denies any radiation, denies any weakness in his lower extremity, denies any problems with going to the bathroom or decrease in sensation. Patient was trying to tough the pain out but it was getting progressively worse, for the past 3 days he was barely out of bed. His wife according to the hospital today for further evaluation.   PT Comments   Patient with increased pain today. RN aware. Heat source in room applied to back in sitting. Patient needing more assist today with mobility. Very unsteady with gait this session  Follow Up Recommendations  Home health PT     Does the patient have the potential to tolerate intense rehabilitation     Barriers to Discharge        Equipment Recommendations  Rolling walker with 5" wheels    Recommendations for Other Services    Frequency Min 3X/week   Progress towards PT Goals Progress towards PT goals: Progressing toward goals  Plan Current plan remains appropriate    Precautions / Restrictions Precautions Precautions: Fall;Back   Pertinent Vitals/Pain COmplained of back pain but did not rate. RN aware    Mobility  Bed Mobility Bed Mobility: Rolling Left;Left Sidelying to Sit Rolling Left: 3: Mod assist;With rail Left Sidelying to Sit: 3: Mod assist;HOB elevated;With rails Details for Bed Mobility Assistance: A for trunk control and support Transfers Sit to Stand: 3: Mod assist;With armrests;From chair/3-in-1 Stand to Sit: 4: Min assist;To  chair/3-in-1;With upper extremity assist Details for Transfer Assistance: VC's for hand placement on seated surface prior to initiating transfers. A to initiate stand as patient in more pain today Ambulation/Gait Ambulation/Gait Assistance: 3: Mod assist Ambulation Distance (Feet): 10 Feet Assistive device: Rolling walker Ambulation/Gait Assistance Details: Patient shakey and very unsteady today. Patient complaining of more pain today than yesterday.  Gait Pattern: Step-through pattern;Decreased stride length;Shuffle;Trunk flexed Gait velocity: decreased General Gait Details: Poor posture, pt unable to make corrective changes     Exercises     PT Diagnosis:    PT Problem List:   PT Treatment Interventions:     PT Goals (current goals can now be found in the care plan section)    Visit Information  Last PT Received On: 05/08/13 Assistance Needed: +1 History of Present Illness: Casey Grimes is a 79 y.o. male with past medical history of of hypertension, CKD stage III, diabetes mellitus type 2 and history of gout. Patient given to the hospital because of acute back pain. Patient said the pain started about a week ago, his lower back, denies any radiation, denies any weakness in his lower extremity, denies any problems with going to the bathroom or decrease in sensation. Patient was trying to tough the pain out but it was getting progressively worse, for the past 3 days he was barely out of bed. His wife according to the hospital today for further evaluation.    Subjective Data      Cognition  Cognition Arousal/Alertness: Awake/alert Behavior During Therapy: WFL for tasks assessed/performed Overall Cognitive  Status: Within Functional Limits for tasks assessed    Balance     End of Session PT - End of Session Equipment Utilized During Treatment: Gait belt Activity Tolerance: Patient limited by fatigue;Patient limited by pain Patient left: in chair;with call bell/phone within  reach;with family/visitor present   GP     Timia Casselman, Adline Potter 05/08/2013, 2:53 PM 05/08/2013 Fredrich Birks PTA [phone] pager [phone] office

## 2013-05-08 NOTE — Progress Notes (Signed)
Subjective: Reviewed PT and OT notes. He has been judged as in need of continue [facility] PT.   Mr. Dines has had significant N/V during the night. He is also having involuntary jerking movement. He reports continue back pain but was able to ambulate yesterday.  Objective: Lab:  Recent Labs  05/06/13 1351 05/07/13 0540  WBC 6.0 6.0  HGB 12.5* 11.4*  HCT 36.4* 34.8*  MCV 85.0 85.7  PLT 260 243    Recent Labs  05/06/13 1351 05/07/13 0540  NA 135 137  K 4.5 4.3  CL 98 99  GLUCOSE 193* 172*  BUN 49* 54*  CREATININE 3.18* 3.60*  CALCIUM 9.7 9.5    Imaging:  Scheduled Meds: . allopurinol  150 mg Oral Daily  . amLODipine  10 mg Oral Daily  . aspirin EC  325 mg Oral Daily  . atorvastatin  10 mg Oral q1800  . calcitRIOL  0.25 mcg Oral Daily  . carvedilol  25 mg Oral BID WC  . cloNIDine  0.3 mg Oral TID  . doxazosin  4 mg Oral QHS  . enoxaparin (LOVENOX) injection  40 mg Subcutaneous Q24H  . furosemide  160 mg Oral Daily  . furosemide  80 mg Oral q1800  . gabapentin  300 mg Oral TID  . insulin aspart  0-5 Units Subcutaneous QHS  . insulin aspart  0-9 Units Subcutaneous TID WC  . insulin glargine  65 Units Subcutaneous QHS  . lisinopril  40 mg Oral Daily  . methocarbamol  500 mg Oral TID  . potassium chloride SA  20 mEq Oral BID  . sodium bicarbonate  650 mg Oral TID   Continuous Infusions:  PRN Meds:.acetaminophen, acetaminophen, alum & mag hydroxide-simeth, HYDROcodone-acetaminophen, morphine injection, ondansetron (ZOFRAN) IV, ondansetron, promethazine   Physical Exam: Filed Vitals:   05/08/13 0431  BP: 149/62  Pulse: 90  Temp: 99.1 F (37.3 C)  Resp: 18    Intake/Output Summary (Last 24 hours) at 05/08/13 1610 Last data filed at 05/08/13 0435  Gross per 24 hour  Intake    634 ml  Output    850 ml  Net   -216 ml   Gen'l - heavyset man in no acute distress HEENT- C&S clear Cor - quiet precordium, RRR Pulm - normal respirations and normal breath  sounds Abd - distended, absent BS, no guarding or rebound, no tenderness Neuro - awake and alert. Having involuntary jerking movements.      Assessment/Plan: 1. Inability to walk - he was able to get up with PT but had a slow start with ambulating but did better with continued effort. No radicular signs. Suspect progressive neuropathy. Gabapentin has caused some drowsiness and may be responsible for nausea  Plan Continue PT  Will continue gabapentin at present dose.  2. Dm- continue SS  3. CKD IV - no change  4. GI - patient with abdominal distention, absent BS and N/V Plan Hold diet  2 view abdomen   Illene Reg[facility] (o) [phone]; (c) [phone] Call-grp - [name]  Tele: [phone]  05/08/2013, 6:33 AM

## 2013-05-08 NOTE — Telephone Encounter (Signed)
Phone call from Vance Peper, nurse case manager at [facility] [phone] needs an inpatient order to arrange home health PT and a rolling walker. Patient is in 5N Rm 16

## 2013-05-09 DIAGNOSIS — E1142 Type 2 diabetes mellitus with diabetic polyneuropathy: Secondary | ICD-10-CM

## 2013-05-09 DIAGNOSIS — E1149 Type 2 diabetes mellitus with other diabetic neurological complication: Secondary | ICD-10-CM

## 2013-05-09 LAB — GLUCOSE, CAPILLARY
Glucose-Capillary: 194 mg/dL — ABNORMAL HIGH (ref 70–99)
Glucose-Capillary: 187 mg/dL — ABNORMAL HIGH (ref 70–99)

## 2013-05-09 NOTE — Progress Notes (Addendum)
Physical Therapy Treatment Patient Details Name: Casey Grimes MRN: [id number] DOB: 05-28-1929 Today's Date: 05/09/2013 Time: 4098-1191 PT Time Calculation (min): 13 min  PT Assessment / Plan / Recommendation  History of Present Illness Casey Grimes is a 79 y.o. male with past medical history of of hypertension, CKD stage III, diabetes mellitus type 2 and history of gout. Patient given to the hospital because of acute back pain. Patient said the pain started about a week ago, his lower back, denies any radiation, denies any weakness in his lower extremity, denies any problems with going to the bathroom or decrease in sensation. Patient was trying to tough the pain out but it was getting progressively worse, for the past 3 days he was barely out of bed. His wife according to the hospital today for further evaluation.   PT Comments   Pt c/o moderate back pain but states it's better controlled than yesterday.  Mobility still limited by pain & weakness.  Unsteady with ambulation.    Per MD note, there was discussion about pt d/cing to [facility] rather than directly home.  Pt confirms this.  D/c plans updated.     Follow Up Recommendations  SNF     Does the patient have the potential to tolerate intense rehabilitation     Barriers to Discharge        Equipment Recommendations  Rolling walker with 5" wheels    Recommendations for Other Services    Frequency Min 3X/week   Progress towards PT Goals    Plan Discharge plan needs to be updated    Precautions / Restrictions Precautions Precautions: Fall;Back Restrictions Weight Bearing Restrictions: No   Pertinent Vitals/Pain 6/10 back.  Heat source in room applied to back in sitting.  Pt reports increased comfort achieved.      Mobility  Bed Mobility Bed Mobility: Rolling Left;Left Sidelying to Sit;Sitting - Scoot to Edge of Bed Rolling Left: 3: Mod assist;With rail Left Sidelying to Sit: 3: Mod assist;With rails Details for Bed  Mobility Assistance: Cues for sequencing & technique.  (A) to roll onto side, lift shoulders/trunk to sitting upright.   Transfers Transfers: Sit to Stand;Stand to Sit Sit to Stand: 4: Min assist;With upper extremity assist;From bed Stand to Sit: 4: Min assist;With upper extremity assist;With armrests;To chair/3-in-1 Details for Transfer Assistance: cues for hand placement.   Ambulation/Gait Ambulation/Gait Assistance: 4: Min assist Ambulation Distance (Feet): 20 Feet Assistive device: Rolling walker Ambulation/Gait Assistance Details: (A) for balance/stability & safety.  Pt still unsteady as his UE's slightly shakey, takes shuffling steps.   Gait Pattern: Step-through pattern;Decreased stride length;Shuffle;Trunk flexed Gait velocity: decreased General Gait Details: Poor posture, pt unable to make corrective changes  Stairs: No Wheelchair Mobility Wheelchair Mobility: No     PT Goals (current goals can now be found in the care plan section) Acute Rehab PT Goals PT Goal Formulation: With patient/family Time For Goal Achievement: 05/14/13 Potential to Achieve Goals: Good  Visit Information  Last PT Received On: 05/09/13 Assistance Needed: +1 History of Present Illness: Casey Grimes is a 79 y.o. male with past medical history of of hypertension, CKD stage III, diabetes mellitus type 2 and history of gout. Patient given to the hospital because of acute back pain. Patient said the pain started about a week ago, his lower back, denies any radiation, denies any weakness in his lower extremity, denies any problems with going to the bathroom or decrease in sensation. Patient was trying to tough the pain  out but it was getting progressively worse, for the past 3 days he was barely out of bed. His wife according to the hospital today for further evaluation.    Subjective Data      Cognition  Cognition Arousal/Alertness: Awake/alert Behavior During Therapy: WFL for tasks  assessed/performed Overall Cognitive Status: Within Functional Limits for tasks assessed    Balance     End of Session PT - End of Session Equipment Utilized During Treatment: Gait belt Activity Tolerance: Patient limited by pain;Patient limited by fatigue Patient left: in chair;with call bell/phone within reach Nurse Communication: Mobility status   GP     Lara Mulch 05/09/2013, 9:59 AM   Verdell Face, PTA [phone] 05/09/2013

## 2013-05-09 NOTE — Progress Notes (Signed)
Subjective: Had a good night - no N/V. Reviewed PT notes - he is still having pain and needs mobility assistance. He voices no complaints this AM.  Objective: Lab:  Recent Labs  05/06/13 1351 05/07/13 0540  WBC 6.0 6.0  HGB 12.5* 11.4*  HCT 36.4* 34.8*  MCV 85.0 85.7  PLT 260 243    Recent Labs  05/06/13 1351 05/07/13 0540  NA 135 137  K 4.5 4.3  CL 98 99  GLUCOSE 193* 172*  BUN 49* 54*  CREATININE 3.18* 3.60*  CALCIUM 9.7 9.5    Imaging:  Scheduled Meds: . allopurinol  150 mg Oral Daily  . amLODipine  10 mg Oral Daily  . aspirin EC  325 mg Oral Daily  . atorvastatin  10 mg Oral q1800  . calcitRIOL  0.25 mcg Oral Daily  . carvedilol  25 mg Oral BID WC  . cloNIDine  0.3 mg Oral TID  . doxazosin  4 mg Oral QHS  . enoxaparin (LOVENOX) injection  40 mg Subcutaneous Q24H  . furosemide  160 mg Oral Daily  . furosemide  80 mg Oral q1800  . gabapentin  300 mg Oral TID  . insulin aspart  0-5 Units Subcutaneous QHS  . insulin aspart  0-9 Units Subcutaneous TID WC  . insulin glargine  65 Units Subcutaneous QHS  . lisinopril  40 mg Oral Daily  . methocarbamol  500 mg Oral TID  . potassium chloride SA  20 mEq Oral BID  . sodium bicarbonate  650 mg Oral TID   Continuous Infusions:  PRN Meds:.acetaminophen, acetaminophen, alum & mag hydroxide-simeth, HYDROcodone-acetaminophen, morphine injection, ondansetron (ZOFRAN) IV, ondansetron, promethazine   Physical Exam: Filed Vitals:   05/09/13 0607  BP: 130/65  Pulse: 72  Temp: 98.7 F (37.1 C)  Resp: 18   Gen'l - heavyset man in no distress HEENT - C&S clear Cor - RRR Pulm - normal respirations.     Assessment/Plan: 1. Mobility - per PT note he still requires assistance with mobility. Discussed the situation with Mr. Vea and Mrs. Aquilar - the preferred option is ST-SNF. He is taking gabapentin - it does make him a bit drowsy but seems to help his pain.  Plan Continue gabapentin  Consult for SNF  2-3 -  stable  4. GI - doing better. KUB was normal. He is tolerating a diet.    [facility] Reg[facility] [place] [facility] (o) [phone]; (c) [phone] Call-grp - [name]  Tele: [phone]  05/09/2013, 6:20 AM

## 2013-05-09 NOTE — Progress Notes (Signed)
Clinical Social Work Department  CLINICAL SOCIAL WORK PLACEMENT NOTE  Patient: HASHIR DELEEUW Account Number: [id number] Admit date: 05/06/13  Clinical Social Worker: Sabino Niemann LCSWA Date/time: 05/09/2013 9:00 AM  Clinical Social Work is seeking post-discharge placement for this patient at the following level of care: SKILLED NURSING (*CSW will update this form in Epic as items are completed)  05/09/2013 Patient/family provided with [facility] Department of Clinical Social Work's list of facilities offering this level of care within the geographic area requested by the patient (or if unable, by the patient's family).  10/22/2014Patient/family informed of their freedom to choose among providers that offer the needed level of care, that participate in Medicare, Medicaid or managed care program needed by the patient, have an available bed and are willing to accept the patient.  10/22/2014Patient/family informed of [facility]' ownership interest in [facility], as well as of the fact that they are under no obligation to receive care at this facility.  PASARR submitted to EDS on 05/09/2013  PASARR number received from EDS on 05/09/2013  FL2 transmitted to all facilities in geographic area requested by pt/family on 05/09/2013  FL2 transmitted to all facilities within larger geographic area on  Patient informed that his/her managed care company has contracts with or will negotiate with certain facilities, including the following:  Patient/family informed of bed offers received:  Patient chooses bed at  Physician recommends and patient chooses bed at  Patient to be transferred to on  Patient to be transferred to facility by  The following physician request were entered in Epic:  Additional Comments:

## 2013-05-09 NOTE — Progress Notes (Signed)
PT agrees with short term rehab at [facility] for d/c plan.   Ruthann Cancer, PT, DPT Acute Rehabilitation Services [phone]

## 2013-05-10 LAB — GLUCOSE, CAPILLARY
Glucose-Capillary: 367 mg/dL — ABNORMAL HIGH (ref 70–99)
Glucose-Capillary: 154 mg/dL — ABNORMAL HIGH (ref 70–99)

## 2013-05-10 MED ORDER — PROMETHAZINE HCL 12.5 MG PO TABS: 12.5000 mg | ORAL_TABLET | Freq: Four times a day (QID) | ORAL | Status: DC | PRN

## 2013-05-10 MED ORDER — GABAPENTIN 300 MG PO CAPS: 300.0000 mg | ORAL_CAPSULE | Freq: Three times a day (TID) | ORAL | Status: DC

## 2013-05-10 NOTE — Discharge Summary (Signed)
Casey Grimes, Casey Grimes                ACCOUNT NO.:  [id number]  MEDICAL RECORD NO.:  [id number]  LOCATION:  5N16C                        FACILITY:  [facility]  PHYSICIAN:  Rosalyn Gess. Norins, MD  DATE OF BIRTH:  1929-05-21  DATE OF ADMISSION:  05/06/2013 DATE OF DISCHARGE:  05/10/2013                              DISCHARGE SUMMARY   ADMITTING DIAGNOSES: 1. Inability to ambulate. 2. Insulin-dependent diabetes. 3. Chronic kidney disease. 4. Hypertension.  DISCHARGE DIAGNOSES: 1. Inability to ambulate. 2. Insulin-dependent diabetes. 3. Chronic kidney disease. 4. Hypertension.  CONSULTANTS:  None.  PROCEDURES:  Imaging  1. CT scan of the lumbar spine performed on October, 19 which revealed normal alignment and no acute bony findings.  Mild-to- moderate multifactorial spinal and lateral recess stenosis at L1-2 and L2-3.  Postoperative changes at L4-5.  Central calcified disk extrusion at L5-S1, but no significant spinal stenosis.  Mild lateral recess encroachment bilaterally noted.  2. MRI scan of the lumbar spine on May 06, 2013 with no acute osseous abnormality.  Mostly congenital lumbar spinal stenosis is borderline to mild at L1-2, L2-3.  Remote postoperative changes at L4-5 with no spinal stenosis or convincing neural impingement.  Multifactorial mild-to- moderate left L2 with bilateral L5 foraminal stenosis.  3. Abdominal x-ray on May 08, 2013, which showed mild distended stomach with air-fluid level, but otherwise unremarkable bowel gas pattern. Bibasilar segmental and subsegmental atelectasis is noted with lumbar spondylosis and degenerative arthropathy of the hips.  HISTORY OF THE PRESENT ILLNESS:  Casey Grimes is a 79 year old gentleman with a history of hypertension; CKD IV; diabetes, insulin dependent; history of gout.  Patient was brought to the hospital because of acute back pain and inability to ambulate.  Patient reported major pain started about 1 week prior  to admission.  It is located in his lower back and denied any radiation of pain to his legs.  He denied any weakness in his lower extremities.  He denied any problem with bladder or bowel control.  His emergent evaluation in the ED did not reveal any significant radicular findings.  MRI as above.  Patient was admitted because of his inability to ambulate.  Please see the H and P and other epic records for past medical history, family history, social history, and admission examination.  HOSPITAL COURSE: 1. Mobility.  Patient's evaluation did not reveal any radicular     findings.  Primary complaint was significant severe pain in his     legs of a burning nature, worse with standing and making it very     difficult for him to walk.  Patient was seen and evaluated by     Physical Therapy who did report that he has significant limitations     in mobility and requires significant assistance.  After discussing     this condition with patient as well as his wife, it was felt he     would benefit from short-term skilled care in order to increase his     mobility.  In addition, patient was started on gabapentin to treat     him for a peripheral neuropathy.  Starting dose was 300 mg t.i.d.  This has caused some drowsiness and therefore the dose has not been     advanced.  At this point, patient's evaluation is complete with no need for further inpatient evaluation or testing.  He is a good candidate for short-term skilled care and is willing to participate.  2. Diabetes.  Patient has been followed on a sliding scale.  He had     been doing well at home.  His most recent A1c is from October 19     was 7.6%.  Plan is to continue his current regimen at the skilled     care facility before returning home to his prior home regimen. 3. CKD 4.  No change in patient's condition.  He is followed by     Nephrology on a regular basis. 4. GI.  Patient did have some abdominal distention, and I suspect  he     suffers from some degree of gastroparesis.  He currently is doing     well with no nausea or vomiting and tolerating a diet. 5. Gout.  Patient has been well controlled with no flares of gout     noted.  DISCHARGE EXAMINATION:  VITAL SIGNS:  Temperature was 98.1, blood pressure 120/60, heart rate 57, respiratory rate 18, oxygen saturations 96% on room air. GENERAL APPEARANCE:  This is a heavy-set gentleman looking his stated age.  He is in no acute distress. HEENT:  Conjunctiva and sclerae were slightly muddy.  Pupils were equal, round, and reactive. NECK:  Supple. CHEST:  Patient is moving air well with no rales, wheezes, or rhonchi. CARDIOVASCULAR:  A 2+ radial pulses.  Precordium is quiet.  Heart sounds are distant, but regular. ABDOMEN:  Mildly distended.  No guarding or rebound.  He has hypoactive bowel sounds. GENITALIA/RECTAL:  Deferred. EXTREMITIES:  Without clubbing, cyanosis, or edema.  No deformities are noted. NEURO:  Patient is awake, alert.  He is oriented to person, place, time and context.  He does move all extremities to command.  Patient did not stood or ambulated.  FINAL LABORATORY:  From May 07, 2013; sodium 137, potassium 4.3, chloride 99, CO2 of 26, BUN 54, creatinine 3.6 with a GFR of 17, glucose was 172.  Uric acid level was 7.0.  CBC from May 07, 2013 with a white count of 6000; hemoglobin 11.4 g; platelet count 243,000.  TSH was checked on May 06, 2013; is minimally elevated at 9.3.  ANA was checked on April 17, 2013 and negative.  DISCHARGE MEDICATIONS: 1. Allopurinol 300 mg daily. 2. Norvasc 10 mg daily. 3. Aspirin 325 mg daily. 4. Atorvastatin 10 mg each evening. 5. Rocaltrol 0.25 mcg capsule daily. 6. Carvedilol 25 mg b.i.d. 7. Clonidine 0.3 mg tablet t.i.d. 8. Cardura 4 mg at bedtime for BPH. 9. Fosinopril 40 mg once daily. 10.Furosemide 160 mg in a.m. and 80 mg in the p.m. 11.Gabapentin 300 mg t.i.d.  patient has been  on sliding scale and     this should be continued with CBGs before meals.  No bedtime     coverage.  Sliding scale should be moderate with 2 units of insulin     for CBG of 201-250, 3 units of insulin per CBG of 251-300, 4 units     of a CBG 301-350, and 5 units for greater than 351. 12.Lantus 65 units subcutaneous at bedtime. 13.Humalog 40 units before breakfast, 40 units before lunch, 45 units     before supper. 14.Potassium 20 mEq b.i.d. 15.Promethazine 12.5 mg p.o.  q.6 hours p.r.n. 16.Sodium bicarbonate 650 mg tablet three times daily.  DISPOSITION:  Patient to be transferred to a skilled care facility for 7- 10 days of PT for strengthening.  He may participate in all facility protocols.  He may have leave of absence with family.  Diet should be a carb-modified diabetic diet.  DISCHARGE INSTRUCTIONS:  Patient is instructed to see Dr. Debby Bud within 7 days of discharge from skilled care.  CONDITION:  Patient's condition at the time of discharge and dictation is medically stable and ready for transfer to the skilled care facility for rehabilitation.     Rosalyn Gess Norins, MD     MEN/MEDQ  D:  05/10/2013  T:  05/10/2013  Job:  [id number]

## 2013-05-10 NOTE — Progress Notes (Signed)
Subjective: No change in condition. Willing to transfer to SNF  Objective: Lab: No results found for this basename: WBC, NEUTROABS, HGB, HCT, MCV, PLT,  in the last 72 hours No results found for this basename: NA, K, CL, CO3, GLUCOSE, BUN, CREATININE, CALCIUM, MG, PHOS,  in the last 72 hours  Imaging:  Scheduled Meds: . allopurinol  150 mg Oral Daily  . amLODipine  10 mg Oral Daily  . aspirin EC  325 mg Oral Daily  . atorvastatin  10 mg Oral q1800  . calcitRIOL  0.25 mcg Oral Daily  . carvedilol  25 mg Oral BID WC  . cloNIDine  0.3 mg Oral TID  . doxazosin  4 mg Oral QHS  . enoxaparin (LOVENOX) injection  40 mg Subcutaneous Q24H  . furosemide  160 mg Oral Daily  . furosemide  80 mg Oral q1800  . gabapentin  300 mg Oral TID  . insulin aspart  0-5 Units Subcutaneous QHS  . insulin aspart  0-9 Units Subcutaneous TID WC  . insulin glargine  65 Units Subcutaneous QHS  . lisinopril  40 mg Oral Daily  . methocarbamol  500 mg Oral TID  . potassium chloride SA  20 mEq Oral BID  . sodium bicarbonate  650 mg Oral TID   Continuous Infusions:  PRN Meds:.acetaminophen, acetaminophen, alum & mag hydroxide-simeth, HYDROcodone-acetaminophen, morphine injection, ondansetron (ZOFRAN) IV, ondansetron, promethazine   Physical Exam: Filed Vitals:   05/10/13 0546  BP: 120/60  Pulse: 57  Temp: 98.1 F (36.7 C)  Resp: 18   See d/c summary      Assessment/Plan: For transfer to SNF. Priority dicttion done # [id number]   [name] (o) [phone]; (c) [phone] Call-grp - [name]  Tele: [phone]  05/10/2013, 6:38 AM

## 2013-05-10 NOTE — Clinical Social Work Psychosocial (Signed)
Clinical Social Work Department  BRIEF PSYCHOSOCIAL ASSESSMENT  Patient: ISAEL STILLE Account Number: [id number] Admit date: 05/06/13 Clinical Social Worker Pari Lombard Riley Kill, MSW Date/Time:  Referred by: Physician Date Referred: 05/09/13 Referred for   SNF Placement   Other Referral:  Interview type: Patient  And patient's wife Other interview type: PSYCHOSOCIAL DATA  Living Status: Wife Admitted from facility:  Level of care:  Primary support name: Smalling,[name]  Primary support relationship to patient: wife Degree of support available:  Strong and vested  CURRENT CONCERNS  Current Concerns   Post-Acute Placement   Other Concerns:  SOCIAL WORK ASSESSMENT / PLAN  CSW met with pt re: PT recommendation for SNF.   Pt lives with his spouse  CSW explained placement process and answered questions.   Pt's wife reports [facility]  as her preference    CSW completed FL2 and initiated SNF search.     Assessment/plan status: Information/Referral to [facility]  Other assessment/ plan:  Information/referral to community resources:  SNF   PTAR  PATIENT'S/FAMILY'S RESPONSE TO PLAN OF CARE:  Pt's wife  reports she is agreeable to the patient going to ST SNF in order to increase strength and independence with mobility prior to returning home  Pt's wife and son verbalized understanding of placement process and appreciation for CSW assist.   Sabino Niemann, MSW [phone]

## 2013-06-06 ENCOUNTER — Ambulatory Visit (INDEPENDENT_AMBULATORY_CARE_PROVIDER_SITE_OTHER): Payer: Medicare Other | Admitting: Internal Medicine

## 2013-06-06 ENCOUNTER — Encounter: Payer: Self-pay | Admitting: Internal Medicine

## 2013-06-06 VITALS — BP 130/52 | HR 69 | Temp 97.8°F | Wt 236.0 lb

## 2013-06-06 DIAGNOSIS — E1149 Type 2 diabetes mellitus with other diabetic neurological complication: Secondary | ICD-10-CM

## 2013-06-06 DIAGNOSIS — E119 Type 2 diabetes mellitus without complications: Secondary | ICD-10-CM

## 2013-06-06 DIAGNOSIS — E1142 Type 2 diabetes mellitus with diabetic polyneuropathy: Secondary | ICD-10-CM

## 2013-06-06 MED ORDER — GABAPENTIN 300 MG PO CAPS: 300.0000 mg | ORAL_CAPSULE | Freq: Three times a day (TID) | ORAL | Status: DC

## 2013-06-06 NOTE — Patient Instructions (Signed)
1. Walking - you seem to be doing much better with the help of phyiscal therapy the rolling walker and treatment of the peripheral neuropathy/myopathy  Along with gabapentin.  Plan Continue to use the gabapentin 3 times a day.  2. Diabetes -  Lab Results  Component Value Date   HGBA1C 7.6* 05/06/2013   The low blood sugar most certainly came from taking insulin but not eating.  Plan IF YOU ARE NOT GOING TO EAT DO NOT TAKE HUMALOG. IF YOU ARE ONLY GOING TO EAT A LITTLE TAKE 1/2 YOU HUMALOG DOSE.  I am glad that you are doing better. Come back to see me in 1 month, sooner if needed.

## 2013-06-06 NOTE — Progress Notes (Signed)
Subjective:    Patient ID: Casey Grimes, male    DOB: 1929-01-26, 79 y.o.   MRN: [id number]  HPI Mfr. Bralley was hospitalized in October for inability to walk. Evaluation was unrevealing: negative CT spine, MRI spine. Diagnosis at discharge was peripheral neuropathy. He was discharged 10/23 to SNF and left SNF 11/17. He did well - he got a good response to gabapentin and physical therapy. At discharge from SNF he is using a rolling walker and is able to ambulate independently.   11/17 PM he had a CBG 24- EMS was called and resuscitated him with glucose. Of note he had not been eating well. Subsequent readings 11/18 and today are ok with a little bit of an excursion.   Past Medical History  Diagnosis Date  . Gout   . Hypertension   . Renal insufficiency     CKD stage III, last creatinine 6-10 was 3  . Impotence   . Former smoker     quit 1988  . Diabetes mellitus     type II  . H/O: CVA (cardiovascular accident)   . Depression   . Hyperlipidemia   . Osteoarthritis    Past Surgical History  Procedure Laterality Date  . Back surgery    . Small lesion excised from back of right hand    . Lower atrial doppler  02-13-04  . Electrocardiogram  08-04-99  . Stress cardiolite  08-13-99   Family History  Problem Relation Age of Onset  . Coronary artery disease Neg Hx    History   Social History  . Marital Status: Married    Spouse Name: Adela Lank    Number of Children: 1  . Years of Education: 6   Occupational History  . retired    Social History Main Topics  . Smoking status: Former Smoker    Quit date: 07/19/1986  . Smokeless tobacco: Former Neurosurgeon  . Alcohol Use: No  . Drug Use: No  . Sexual Activity: No   Other Topics Concern  . Not on file   Social History Narrative   6th grade, married - >50 years, 1 son, others (?). Lives with his wife. Work - retired and disabled.   ' Current Outpatient Prescriptions on File Prior to Visit  Medication Sig Dispense Refill  .  allopurinol (ZYLOPRIM) 300 MG tablet Take 150 mg by mouth daily.        Marland Kitchen amLODipine (NORVASC) 10 MG tablet Take 10 mg by mouth daily.      Marland Kitchen aspirin EC 325 MG tablet Take 325 mg by mouth daily.      Marland Kitchen atorvastatin (LIPITOR) 10 MG tablet Take 10 mg by mouth daily.      . calcitRIOL (ROCALTROL) 0.25 MCG capsule Take 0.25 mcg by mouth daily.        . carvedilol (COREG) 25 MG tablet Take 25 mg by mouth 2 (two) times daily with a meal.      . cloNIDine (CATAPRES) 0.3 MG tablet Take 0.3 mg by mouth 3 (three) times daily.      Marland Kitchen doxazosin (CARDURA) 4 MG tablet Take 4 mg by mouth at bedtime.      . fosinopril (MONOPRIL) 40 MG tablet Take 40 mg by mouth daily.      . furosemide (LASIX) 80 MG tablet Take 80-160 mg by mouth 2 (two) times daily. Takes 160mg  in the morning and 80mg  in the evening      . insulin glargine (LANTUS) 100 UNIT/ML injection  Inject 65 Units into the skin at bedtime.      . insulin lispro (HUMALOG) 100 UNIT/ML injection Inject 40-45 Units into the skin 3 (three) times daily before meals. Inject 40 units daily before breakfast and lunch, then inject 45 units before supper      . potassium chloride SA (K-DUR,KLOR-CON) 20 MEQ tablet Take 20 mEq by mouth 2 (two) times daily.      . promethazine (PHENERGAN) 12.5 MG tablet Take 1 tablet (12.5 mg total) by mouth every 6 (six) hours as needed for nausea.  30 tablet  0  . sodium bicarbonate 650 MG tablet Take 650 mg by mouth 3 (three) times daily.       No current facility-administered medications on file prior to visit.        Review of Systems System review is negative for any constitutional, cardiac, pulmonary, GI or neuro symptoms or complaints other than as described in the HPI.     Objective:   Physical Exam Filed Vitals:   06/06/13 1522  BP: 130/52  Pulse: 69  Temp: 97.8 F (36.6 C)   gen'l - heavy set man in no distress. Ambulating with the assist of a rolling walker Cor - RRR Pulm - normal respirations Neuro -  awake and alert, able to stand w/o assist, ambulates with walker.       Assessment & Plan:

## 2013-06-06 NOTE — Progress Notes (Signed)
Pre visit review using our clinic review tool, if applicable. No additional management support is needed unless otherwise documented below in the visit note. 

## 2013-06-08 DIAGNOSIS — E1142 Type 2 diabetes mellitus with diabetic polyneuropathy: Secondary | ICD-10-CM | POA: Insufficient documentation

## 2013-06-08 NOTE — Assessment & Plan Note (Signed)
Casey Grimes has been doing better but he did not adjust down his insulin in relation to his decreased PO intake.  Plan Continue basal insulin  Eat 4 times a day  If not going to eat than reduce preprandial insulin.

## 2013-06-08 NOTE — Assessment & Plan Note (Signed)
Recent hospitazation for inability to ambulate - final diagnosis was profound neuropathy. He is no w doing better on gabapentin.  Plan Continue present regimen.    He does not feel he needs Home health assistance.

## 2013-06-27 ENCOUNTER — Telehealth: Payer: Self-pay

## 2013-06-27 ENCOUNTER — Other Ambulatory Visit: Payer: Self-pay

## 2013-06-27 ENCOUNTER — Other Ambulatory Visit: Payer: Self-pay | Admitting: Internal Medicine

## 2013-06-27 MED ORDER — INSULIN LISPRO 100 UNIT/ML ~~LOC~~ SOLN: 40.0000 [IU] | Freq: Three times a day (TID) | SUBCUTANEOUS | Status: DC

## 2013-06-27 NOTE — Telephone Encounter (Signed)
Patient's wife calls stating he is out of Humalog.

## 2013-06-29 ENCOUNTER — Other Ambulatory Visit: Payer: Self-pay | Admitting: Internal Medicine

## 2013-07-05 ENCOUNTER — Encounter: Payer: Self-pay | Admitting: Internal Medicine

## 2013-07-05 ENCOUNTER — Ambulatory Visit (INDEPENDENT_AMBULATORY_CARE_PROVIDER_SITE_OTHER): Payer: Medicare Other | Admitting: Internal Medicine

## 2013-07-05 VITALS — BP 148/60 | HR 80 | Temp 97.3°F | Wt 247.0 lb

## 2013-07-05 DIAGNOSIS — E119 Type 2 diabetes mellitus without complications: Secondary | ICD-10-CM

## 2013-07-05 MED ORDER — INSULIN GLARGINE 100 UNIT/ML ~~LOC~~ SOLN: 60.0000 [IU] | Freq: Every day | SUBCUTANEOUS | Status: DC

## 2013-07-05 NOTE — Patient Instructions (Signed)
Happy Holidays.  Thanks for keeping such a good record. The AM blood sugar does seem to be running a bit low.  Plan Reduce lantus to 60 units.  If the AM blood sugar is consistently running 90 or less reduce lantus to 55 units.  Continue the same before meal insulin dose.   May you have a healthy and peaceful holiday with your family.

## 2013-07-05 NOTE — Progress Notes (Signed)
Subjective:    Patient ID: Casey Grimes, male    DOB: 11-01-1928, 79 y.o.   MRN: [id number]  HPI Casey Grimes presents for follow up of diabetes management. At his last visit, one month ago, the concern was for low CBGs. See that note  In the interval he has been doing pretty well. However, his home CBG records show frequent low readings in the AM but normal to high throughout the day.   He has continued to work with [facility]. He is ambulating with a walker and his wife reports that he has been doing well.   PMH, FamHx and SocHx reviewed for any changes and relevance.  Current Outpatient Prescriptions on File Prior to Visit  Medication Sig Dispense Refill  . allopurinol (ZYLOPRIM) 300 MG tablet Take 150 mg by mouth daily.        Marland Kitchen amLODipine (NORVASC) 10 MG tablet Take 10 mg by mouth daily.      Marland Kitchen aspirin EC 325 MG tablet Take 325 mg by mouth daily.      Marland Kitchen atorvastatin (LIPITOR) 10 MG tablet Take 10 mg by mouth daily.      . B-D INS SYRINGE 0.5CC/30GX1/2" 30G X 1/2" 0.5 ML MISC USE 3 PER DAY AS DIRECTED  360 each  11  . calcitRIOL (ROCALTROL) 0.25 MCG capsule Take 0.25 mcg by mouth daily.        . carvedilol (COREG) 25 MG tablet Take 25 mg by mouth 2 (two) times daily with a meal.      . cloNIDine (CATAPRES) 0.3 MG tablet Take 0.3 mg by mouth 3 (three) times daily.      Marland Kitchen doxazosin (CARDURA) 4 MG tablet Take 4 mg by mouth at bedtime.      . fosinopril (MONOPRIL) 40 MG tablet Take 40 mg by mouth daily.      . furosemide (LASIX) 80 MG tablet Take 80-160 mg by mouth 2 (two) times daily. Takes 160mg  in the morning and 80mg  in the evening      . gabapentin (NEURONTIN) 300 MG capsule Take 1 capsule (300 mg total) by mouth 3 (three) times daily.  90 capsule  5  . insulin lispro (HUMALOG) 100 UNIT/ML injection Inject 40-45 Units into the skin 3 (three) times daily before meals. Inject 40 units daily before breakfast and lunch, then inject 45 units before supper  10 mL  11  . potassium  chloride SA (K-DUR,KLOR-CON) 20 MEQ tablet Take 20 mEq by mouth 2 (two) times daily.      . promethazine (PHENERGAN) 12.5 MG tablet Take 1 tablet (12.5 mg total) by mouth every 6 (six) hours as needed for nausea.  30 tablet  0  . sodium bicarbonate 650 MG tablet Take 650 mg by mouth 3 (three) times daily.       No current facility-administered medications on file prior to visit.      Review of Systems System review is negative for any constitutional, cardiac, pulmonary, GI or neuro symptoms or complaints other than as described in the HPI.     Objective:   Physical Exam Filed Vitals:   07/05/13 1032  BP: 148/60  Pulse: 80  Temp: 97.3 F (36.3 C)   Wt Readings from Last 3 Encounters:  07/05/13 247 lb (112.038 kg)  06/06/13 236 lb (107.049 kg)  05/06/13 240 lb (108.863 kg)   Gen'l- heavy set overweight man in no distress Cor 2+ radial Pulm - normal respirations Neuro - needs 1+  assist to rise from sitting to standing. Slow gait with walker.        Assessment & Plan:

## 2013-07-05 NOTE — Assessment & Plan Note (Signed)
CBGs from home with some low AM readings.  Plan Reduce Lantus to 60 units qHS  If AM CBGs 90 or less consistently, lower lantus to 55 units.

## 2013-07-05 NOTE — Progress Notes (Signed)
Pre visit review using our clinic review tool, if applicable. No additional management support is needed unless otherwise documented below in the visit note. 

## 2013-07-11 ENCOUNTER — Other Ambulatory Visit: Payer: Self-pay

## 2013-07-11 MED ORDER — INSULIN LISPRO 100 UNIT/ML ~~LOC~~ SOLN: SUBCUTANEOUS | Status: DC

## 2013-07-11 NOTE — Telephone Encounter (Signed)
Patient's wife called requesting a month supply on the Humalog. What was received was 10 days

## 2013-07-23 ENCOUNTER — Emergency Department (HOSPITAL_COMMUNITY): Payer: PRIVATE HEALTH INSURANCE

## 2013-07-23 ENCOUNTER — Inpatient Hospital Stay (HOSPITAL_COMMUNITY)
Admission: EM | Admit: 2013-07-23 | Discharge: 2013-07-27 | DRG: 291 | Disposition: A | Discharge status: Home / Self Care | Payer: PRIVATE HEALTH INSURANCE | Attending: Internal Medicine | Admitting: Internal Medicine

## 2013-07-23 ENCOUNTER — Telehealth: Payer: Self-pay | Admitting: *Deleted

## 2013-07-23 ENCOUNTER — Encounter (HOSPITAL_COMMUNITY): Payer: Self-pay | Admitting: Emergency Medicine

## 2013-07-23 DIAGNOSIS — D631 Anemia in chronic kidney disease: Secondary | ICD-10-CM | POA: Diagnosis present

## 2013-07-23 DIAGNOSIS — M79609 Pain in unspecified limb: Secondary | ICD-10-CM

## 2013-07-23 DIAGNOSIS — N183 Chronic kidney disease, stage 3 unspecified: Secondary | ICD-10-CM

## 2013-07-23 DIAGNOSIS — J810 Acute pulmonary edema: Secondary | ICD-10-CM

## 2013-07-23 DIAGNOSIS — E1142 Type 2 diabetes mellitus with diabetic polyneuropathy: Secondary | ICD-10-CM

## 2013-07-23 DIAGNOSIS — Z8673 Personal history of transient ischemic attack (TIA), and cerebral infarction without residual deficits: Secondary | ICD-10-CM

## 2013-07-23 DIAGNOSIS — E46 Unspecified protein-calorie malnutrition: Secondary | ICD-10-CM | POA: Diagnosis present

## 2013-07-23 DIAGNOSIS — D638 Anemia in other chronic diseases classified elsewhere: Secondary | ICD-10-CM | POA: Diagnosis present

## 2013-07-23 DIAGNOSIS — Z0000 Encounter for general adult medical examination without abnormal findings: Secondary | ICD-10-CM

## 2013-07-23 DIAGNOSIS — E1169 Type 2 diabetes mellitus with other specified complication: Secondary | ICD-10-CM | POA: Diagnosis present

## 2013-07-23 DIAGNOSIS — N039 Chronic nephritic syndrome with unspecified morphologic changes: Secondary | ICD-10-CM

## 2013-07-23 DIAGNOSIS — M545 Low back pain, unspecified: Secondary | ICD-10-CM

## 2013-07-23 DIAGNOSIS — Z79899 Other long term (current) drug therapy: Secondary | ICD-10-CM

## 2013-07-23 DIAGNOSIS — R0789 Other chest pain: Secondary | ICD-10-CM

## 2013-07-23 DIAGNOSIS — M109 Gout, unspecified: Secondary | ICD-10-CM

## 2013-07-23 DIAGNOSIS — N189 Chronic kidney disease, unspecified: Secondary | ICD-10-CM

## 2013-07-23 DIAGNOSIS — E119 Type 2 diabetes mellitus without complications: Secondary | ICD-10-CM

## 2013-07-23 DIAGNOSIS — M25629 Stiffness of unspecified elbow, not elsewhere classified: Secondary | ICD-10-CM

## 2013-07-23 DIAGNOSIS — I129 Hypertensive chronic kidney disease with stage 1 through stage 4 chronic kidney disease, or unspecified chronic kidney disease: Secondary | ICD-10-CM | POA: Diagnosis present

## 2013-07-23 DIAGNOSIS — I519 Heart disease, unspecified: Secondary | ICD-10-CM

## 2013-07-23 DIAGNOSIS — I509 Heart failure, unspecified: Secondary | ICD-10-CM

## 2013-07-23 DIAGNOSIS — Z7982 Long term (current) use of aspirin: Secondary | ICD-10-CM

## 2013-07-23 DIAGNOSIS — D649 Anemia, unspecified: Secondary | ICD-10-CM

## 2013-07-23 DIAGNOSIS — I5033 Acute on chronic diastolic (congestive) heart failure: Principal | ICD-10-CM | POA: Diagnosis present

## 2013-07-23 DIAGNOSIS — R0602 Shortness of breath: Secondary | ICD-10-CM

## 2013-07-23 DIAGNOSIS — E1121 Type 2 diabetes mellitus with diabetic nephropathy: Secondary | ICD-10-CM | POA: Diagnosis present

## 2013-07-23 DIAGNOSIS — N400 Enlarged prostate without lower urinary tract symptoms: Secondary | ICD-10-CM | POA: Diagnosis present

## 2013-07-23 DIAGNOSIS — Z87891 Personal history of nicotine dependence: Secondary | ICD-10-CM

## 2013-07-23 DIAGNOSIS — R112 Nausea with vomiting, unspecified: Secondary | ICD-10-CM

## 2013-07-23 DIAGNOSIS — I10 Essential (primary) hypertension: Secondary | ICD-10-CM

## 2013-07-23 DIAGNOSIS — J9691 Respiratory failure, unspecified with hypoxia: Secondary | ICD-10-CM

## 2013-07-23 DIAGNOSIS — N184 Chronic kidney disease, stage 4 (severe): Secondary | ICD-10-CM

## 2013-07-23 DIAGNOSIS — E785 Hyperlipidemia, unspecified: Secondary | ICD-10-CM | POA: Diagnosis present

## 2013-07-23 DIAGNOSIS — J9601 Acute respiratory failure with hypoxia: Secondary | ICD-10-CM

## 2013-07-23 DIAGNOSIS — J9600 Acute respiratory failure, unspecified whether with hypoxia or hypercapnia: Secondary | ICD-10-CM

## 2013-07-23 DIAGNOSIS — E1149 Type 2 diabetes mellitus with other diabetic neurological complication: Secondary | ICD-10-CM | POA: Diagnosis present

## 2013-07-23 DIAGNOSIS — Z6835 Body mass index (BMI) 35.0-35.9, adult: Secondary | ICD-10-CM

## 2013-07-23 HISTORY — DX: Other chronic pain: G89.29

## 2013-07-23 HISTORY — DX: Respiratory failure, unspecified with hypoxia: J96.91

## 2013-07-23 HISTORY — DX: Low back pain, unspecified: M54.50

## 2013-07-23 HISTORY — DX: Type 2 diabetes mellitus with diabetic polyneuropathy: E11.42

## 2013-07-23 HISTORY — DX: Cerebral infarction, unspecified: I63.9

## 2013-07-23 HISTORY — DX: Chronic kidney disease, stage 4 (severe): N18.4

## 2013-07-23 HISTORY — DX: Low back pain: M54.5

## 2013-07-23 HISTORY — DX: Type 2 diabetes mellitus without complications: E11.9

## 2013-07-23 LAB — BASIC METABOLIC PANEL
BUN: 52 mg/dL — ABNORMAL HIGH (ref 6–23)
CHLORIDE: 102 meq/L (ref 96–112)
CO2: 25 mEq/L (ref 19–32)
CREATININE: 2.92 mg/dL — AB (ref 0.50–1.35)
Calcium: 9.2 mg/dL (ref 8.4–10.5)
GFR calc Af Amer: 21 mL/min — ABNORMAL LOW (ref >90)
GFR calc non Af Amer: 18 mL/min — ABNORMAL LOW (ref >90)
Glucose, Bld: 181 mg/dL — ABNORMAL HIGH (ref 70–99)
Potassium: 5 mEq/L (ref 3.7–5.3)
Sodium: 141 mEq/L (ref 137–147)

## 2013-07-23 LAB — URINALYSIS, ROUTINE W REFLEX MICROSCOPIC
Bilirubin Urine: NEGATIVE
GLUCOSE, UA: NEGATIVE mg/dL
Ketones, ur: NEGATIVE mg/dL
LEUKOCYTES UA: NEGATIVE
Nitrite: NEGATIVE
PH: 6 (ref 5.0–8.0)
Protein, ur: 30 mg/dL — AB
SPECIFIC GRAVITY, URINE: 1.009 (ref 1.005–1.030)
Urobilinogen, UA: 0.2 mg/dL (ref 0.0–1.0)

## 2013-07-23 LAB — CBC
HCT: 34.1 % — ABNORMAL LOW (ref 39.0–52.0)
HEMATOCRIT: 33.5 % — AB (ref 39.0–52.0)
Hemoglobin: 11 g/dL — ABNORMAL LOW (ref 13.0–17.0)
Hemoglobin: 11.1 g/dL — ABNORMAL LOW (ref 13.0–17.0)
MCH: 28.3 pg (ref 26.0–34.0)
MCH: 28.1 pg (ref 26.0–34.0)
MCHC: 32.8 g/dL (ref 30.0–36.0)
MCHC: 32.6 g/dL (ref 30.0–36.0)
MCV: 86.1 fL (ref 78.0–100.0)
MCV: 86.3 fL (ref 78.0–100.0)
PLATELETS: 300 10*3/uL (ref 150–400)
Platelets: 300 10*3/uL (ref 150–400)
RBC: 3.89 MIL/uL — AB (ref 4.22–5.81)
RBC: 3.95 MIL/uL — AB (ref 4.22–5.81)
RDW: 16.3 % — ABNORMAL HIGH (ref 11.5–15.5)
RDW: 16.3 % — AB (ref 11.5–15.5)
WBC: 6.7 10*3/uL (ref 4.0–10.5)
WBC: 5.9 10*3/uL (ref 4.0–10.5)

## 2013-07-23 LAB — POCT I-STAT TROPONIN I: Troponin i, poc: 0 ng/mL (ref 0.00–0.08)

## 2013-07-23 LAB — TROPONIN I

## 2013-07-23 LAB — CREATININE, SERUM
CREATININE: 2.94 mg/dL — AB (ref 0.50–1.35)
GFR calc non Af Amer: 18 mL/min — ABNORMAL LOW (ref >90)
GFR, EST AFRICAN AMERICAN: 21 mL/min — AB (ref >90)

## 2013-07-23 LAB — PRO B NATRIURETIC PEPTIDE: PRO B NATRI PEPTIDE: 914.1 pg/mL — AB (ref 0–450)

## 2013-07-23 LAB — GLUCOSE, CAPILLARY
Glucose-Capillary: 375 mg/dL — ABNORMAL HIGH (ref 70–99)
Glucose-Capillary: 365 mg/dL — ABNORMAL HIGH (ref 70–99)

## 2013-07-23 LAB — URINE MICROSCOPIC-ADD ON

## 2013-07-23 MED ORDER — ATORVASTATIN CALCIUM 10 MG PO TABS
10.0000 mg | ORAL_TABLET | Freq: Every day | ORAL | Status: DC
  Administered 2013-07-23 – 2013-07-26 (×4): 10 mg via ORAL
  Filled 2013-07-23 (×5): qty 1

## 2013-07-23 MED ORDER — FUROSEMIDE 10 MG/ML IJ SOLN: 80.0000 mg | Freq: Two times a day (BID) | INTRAMUSCULAR | Status: DC

## 2013-07-23 MED ORDER — CARVEDILOL 25 MG PO TABS
25.0000 mg | ORAL_TABLET | Freq: Two times a day (BID) | ORAL | Status: DC
  Administered 2013-07-23 – 2013-07-27 (×9): 25 mg via ORAL
  Filled 2013-07-23 (×11): qty 1

## 2013-07-23 MED ORDER — FUROSEMIDE 10 MG/ML IJ SOLN
120.0000 mg | Freq: Three times a day (TID) | INTRAVENOUS | Status: DC
  Administered 2013-07-23 – 2013-07-25 (×7): 120 mg via INTRAVENOUS
  Filled 2013-07-23 (×10): qty 12

## 2013-07-23 MED ORDER — SODIUM CHLORIDE 0.9 % IJ SOLN
3.0000 mL | Freq: Two times a day (BID) | INTRAMUSCULAR | Status: DC
  Administered 2013-07-23 – 2013-07-26 (×8): 3 mL via INTRAVENOUS

## 2013-07-23 MED ORDER — INSULIN ASPART 100 UNIT/ML ~~LOC~~ SOLN
0.0000 [IU] | Freq: Every day | SUBCUTANEOUS | Status: DC
  Administered 2013-07-23: 5 [IU] via SUBCUTANEOUS

## 2013-07-23 MED ORDER — POTASSIUM CHLORIDE CRYS ER 20 MEQ PO TBCR
20.0000 meq | EXTENDED_RELEASE_TABLET | Freq: Two times a day (BID) | ORAL | Status: DC
  Administered 2013-07-23 – 2013-07-26 (×8): 20 meq via ORAL
  Filled 2013-07-23 (×10): qty 1

## 2013-07-23 MED ORDER — PROMETHAZINE HCL 25 MG PO TABS: 12.5000 mg | ORAL_TABLET | Freq: Four times a day (QID) | ORAL | Status: DC | PRN

## 2013-07-23 MED ORDER — ALLOPURINOL 150 MG HALF TABLET
150.0000 mg | ORAL_TABLET | Freq: Every day | ORAL | Status: DC
  Administered 2013-07-23 – 2013-07-26 (×4): 150 mg via ORAL
  Filled 2013-07-23 (×5): qty 1

## 2013-07-23 MED ORDER — AMLODIPINE BESYLATE 10 MG PO TABS
10.0000 mg | ORAL_TABLET | Freq: Every day | ORAL | Status: DC
  Administered 2013-07-23 – 2013-07-26 (×4): 10 mg via ORAL
  Filled 2013-07-23 (×5): qty 1

## 2013-07-23 MED ORDER — INSULIN GLARGINE 100 UNIT/ML ~~LOC~~ SOLN
55.0000 [IU] | Freq: Every day | SUBCUTANEOUS | Status: DC
  Administered 2013-07-23: 55 [IU] via SUBCUTANEOUS
  Filled 2013-07-23 (×3): qty 0.55

## 2013-07-23 MED ORDER — CALCITRIOL 0.25 MCG PO CAPS
0.2500 ug | ORAL_CAPSULE | Freq: Every day | ORAL | Status: DC
  Administered 2013-07-23 – 2013-07-26 (×4): 0.25 ug via ORAL
  Filled 2013-07-23 (×5): qty 1

## 2013-07-23 MED ORDER — SODIUM BICARBONATE 650 MG PO TABS
650.0000 mg | ORAL_TABLET | Freq: Three times a day (TID) | ORAL | Status: DC
  Administered 2013-07-23 – 2013-07-26 (×11): 650 mg via ORAL
  Filled 2013-07-23 (×15): qty 1

## 2013-07-23 MED ORDER — FUROSEMIDE 10 MG/ML IJ SOLN
40.0000 mg | Freq: Once | INTRAMUSCULAR | Status: AC
  Administered 2013-07-23: 40 mg via INTRAVENOUS

## 2013-07-23 MED ORDER — CLONIDINE HCL 0.3 MG PO TABS
0.3000 mg | ORAL_TABLET | Freq: Three times a day (TID) | ORAL | Status: DC
  Administered 2013-07-23 – 2013-07-26 (×12): 0.3 mg via ORAL
  Filled 2013-07-23 (×15): qty 1

## 2013-07-23 MED ORDER — DOXAZOSIN MESYLATE 4 MG PO TABS
4.0000 mg | ORAL_TABLET | Freq: Every day | ORAL | Status: DC
  Administered 2013-07-23 – 2013-07-26 (×4): 4 mg via ORAL
  Filled 2013-07-23 (×5): qty 1

## 2013-07-23 MED ORDER — ONDANSETRON HCL 4 MG/2ML IJ SOLN
4.0000 mg | Freq: Four times a day (QID) | INTRAMUSCULAR | Status: DC | PRN
  Administered 2013-07-24: 4 mg via INTRAVENOUS
  Filled 2013-07-23: qty 2

## 2013-07-23 MED ORDER — SODIUM CHLORIDE 0.9 % IJ SOLN
3.0000 mL | INTRAMUSCULAR | Status: DC | PRN
  Administered 2013-07-24: 3 mL via INTRAVENOUS

## 2013-07-23 MED ORDER — ASPIRIN EC 325 MG PO TBEC
325.0000 mg | DELAYED_RELEASE_TABLET | Freq: Every day | ORAL | Status: DC
  Administered 2013-07-23 – 2013-07-27 (×5): 325 mg via ORAL
  Filled 2013-07-23 (×6): qty 1

## 2013-07-23 MED ORDER — GABAPENTIN 300 MG PO CAPS
300.0000 mg | ORAL_CAPSULE | Freq: Three times a day (TID) | ORAL | Status: DC
  Administered 2013-07-23 – 2013-07-26 (×12): 300 mg via ORAL
  Filled 2013-07-23 (×15): qty 1

## 2013-07-23 MED ORDER — INSULIN ASPART 100 UNIT/ML ~~LOC~~ SOLN
0.0000 [IU] | Freq: Three times a day (TID) | SUBCUTANEOUS | Status: DC
  Administered 2013-07-23: 15 [IU] via SUBCUTANEOUS
  Administered 2013-07-24: 11 [IU] via SUBCUTANEOUS

## 2013-07-23 MED ORDER — SODIUM CHLORIDE 0.9 % IV SOLN: 250.0000 mL | INTRAVENOUS | Status: DC | PRN

## 2013-07-23 MED ORDER — ACETAMINOPHEN 325 MG PO TABS: 650.0000 mg | ORAL_TABLET | ORAL | Status: DC | PRN

## 2013-07-23 MED ORDER — ENOXAPARIN SODIUM 30 MG/0.3ML ~~LOC~~ SOLN
30.0000 mg | SUBCUTANEOUS | Status: DC
  Administered 2013-07-23 – 2013-07-25 (×3): 30 mg via SUBCUTANEOUS
  Filled 2013-07-23 (×4): qty 0.3

## 2013-07-23 NOTE — Progress Notes (Signed)
[facility] Progress Note [place] TEAM [address]    Casey Grimes EXB:[id number] DOB: 06-18-1929 DOA: 07/23/2013 PCP: Illene Regulus, MD  Brief narrative: 80 year old male patient with history of hypertension, diabetes and chronic kidney disease who presents with progressive shortness of breath. Experienced paroxysmal nocturnal dyspnea during the night which is why he presented to the hospital. No constitutional symptoms endorsed. Noted progressive lower sternum the edema since October. No prior documented history of CHF. Review of outpatient cardiology notes should the patient have preserved systolic function per [facility] in 2010. Patient is normally on high-dose oral Lasix at home 160 mg in the morning and 80 mg at night. He was given Lasix 40 mg IV in the ER but had not diuresed with that dosage. Chest x-ray done after admission revealed bilateral pulmonary edema. Patient appears to have progressive chronic kidney disease over the past several years and is now at stage IV. He does not normal utilize oxygen at home.  Assessment/Plan: Active Problems:   Acute pulmonary edema/Acute respiratory failure with hypoxia -Continue supportive care with oxygen -Have adjusted Lasix to higher dose: 120 mg IV 3 times a day -Since admission patient has diuresed 900 cc -Suspect etiology due to progressive renal dysfunction but as precaution 2-D echocardiogram is pending to determine if true heart failure etiology    HYPERTENSION -Moderate control -Continue carvedilol, Catapres, Cardura -It appears as if ACE inhibitor was held at admission but likely this can be resumed    CKD (chronic kidney disease) stage 4, GFR 15-29 ml/min -As noted appears to have progressed -In discussion with the patient he states he has been evaluated by nephrology in the past; was told that he might need "that thing put in his arm for dialysis" but he says he does not wish to have that done and does not wish to pursue  dialysis -Likely would benefit from eventual palliative medicine evaluation-if rebounds quickly this admission this can be deferred to patient's primary care physician -Continue oral bicarbonate    DIABETES MELLITUS, TYPE II -Continue home Lantus  and sliding scale insulin as needed -Was on high-dose meal coverage at home which points to a possibility patient not adherent to dietary restrictions -Hemoglobin A1c was 7.6 in October    Diabetic peripheral neuropathy    Anemia -? Due to CKD -check anemia panel -may benefit from Aranesp   DVT prophylaxis: Lovenox but given low GFR may need to be switched to subcutaneous heparin Code Status: Full Family Communication: No family at bedside Disposition Plan/Expected LOS: Continue telemetry-discharge from heart failure symptoms resolved-may need PT OT evaluation before discharge but only after her respiratory symptoms have improved Isolation: None Nutritional Status: Suspect a degree of chronic protein calorie malnutrition related to multiple medical comorbidities with recent exacerbation related to dyspnea  Consultants: None  Procedures: 2-D echocardiogram pending  Antibiotics: None  HPI/Subjective:  Sitting up in bed. Currently denies dyspnea at rest. No chest pain.  Objective: Blood pressure 153/57, pulse 78, temperature 98.5 F (36.9 C), temperature source Oral, resp. rate 19, height 5' 10.47" (1.79 m), weight 252 lb 4.8 oz (114.443 kg), SpO2 100.00%.  Intake/Output Summary (Last 24 hours) at 07/23/13 1444 Last data filed at 07/23/13 1300  Gross per 24 hour  Intake    240 ml  Output    900 ml  Net   -660 ml     Exam: All of the exam completed. Patient admitted earlier today.  Scheduled Meds:  Scheduled Meds: . allopurinol  150  mg Oral Daily  . amLODipine  10 mg Oral Daily  . aspirin EC  325 mg Oral Daily  . atorvastatin  10 mg Oral Daily  . calcitRIOL  0.25 mcg Oral Daily  . carvedilol  25 mg Oral BID WC  .  cloNIDine  0.3 mg Oral TID  . doxazosin  4 mg Oral QHS  . enoxaparin (LOVENOX) injection  30 mg Subcutaneous Q24H  . furosemide  120 mg Intravenous Q8H  . gabapentin  300 mg Oral TID  . insulin glargine  55 Units Subcutaneous QHS  . potassium chloride SA  20 mEq Oral BID  . sodium bicarbonate  650 mg Oral TID  . sodium chloride  3 mL Intravenous Q12H   Continuous Infusions:   **Reviewed in detail by the Attending Physician  Data Reviewed: Basic Metabolic Panel:  Recent Labs Lab 07/23/13 0255 07/23/13 0750  NA 141  --   K 5.0  --   CL 102  --   CO2 25  --   GLUCOSE 181*  --   BUN 52*  --   CREATININE 2.92* 2.94*  CALCIUM 9.2  --    Liver Function Tests: No results found for this basename: AST, ALT, ALKPHOS, BILITOT, PROT, ALBUMIN,  in the last 168 hours No results found for this basename: LIPASE, AMYLASE,  in the last 168 hours No results found for this basename: AMMONIA,  in the last 168 hours CBC:  Recent Labs Lab 07/23/13 0255 07/23/13 0750  WBC 6.7 5.9  HGB 11.0* 11.1*  HCT 33.5* 34.1*  MCV 86.1 86.3  PLT 300 300   Cardiac Enzymes:  Recent Labs Lab 07/23/13 0750 07/23/13 1233  TROPONINI <0.30 <0.30   BNP (last 3 results)  Recent Labs  07/23/13 0255  PROBNP 914.1*   CBG: No results found for this basename: GLUCAP,  in the last 168 hours  No results found for this or any previous visit (from the past 240 hour(s)).   Studies:  Recent x-ray studies have been reviewed in detail by the Attending Physician     Junious Silk, ANP [facility] Office  [phone] Pager [phone]  **If unable to reach the above provider after paging please contact the Flow Manager @ [phone]  On-Call/Text Page:      [email or web address]      password [email or web address]  If 7PM-7AM, please contact night-coverage [email or web address] Password [email or web address] 07/23/2013, 2:44 PM   LOS: 0 days

## 2013-07-23 NOTE — Telephone Encounter (Signed)
[name], pts wife called states pt was admitted to hospital.

## 2013-07-23 NOTE — Progress Notes (Signed)
I have seen and assessed the patient and I agree with Sheralyn BoatmanAlison Ellis, nurse practitioner's assessment and plan.

## 2013-07-23 NOTE — Progress Notes (Signed)
Utilization Review Completed.Casey Grimes T1/11/2013  

## 2013-07-23 NOTE — ED Notes (Signed)
Admitting physician at bedside

## 2013-07-23 NOTE — ED Notes (Signed)
Pt alert, NAD, calm, interactive, increased wob, expiritory wheezing, speaking in shortened phrases.  c/o sob, weakness, feeling cold & some nausea,  (denies: bleeding, pain, vd, dizziness or HA).

## 2013-07-23 NOTE — ED Provider Notes (Signed)
CSN: [id number]     Arrival date & time 07/23/13  0221 History   First MD Initiated Contact with Patient 07/23/13 0239     Chief Complaint  Patient presents with  . Shortness of Breath   (Consider location/radiation/quality/duration/timing/severity/associated sxs/prior Treatment) The history is provided by the patient.  MORGAN RENNERT is a 80 y.o. male hx of HTN, DM, here with SOB. He went to sleep around 7 PM and awoke about 9 PM with shortness of breath. Also has progressive flex swelling of the last week or so. Denies any chest pain or cough or fevers. Denies any history of CAD or stents or CHF but has been on lasix. He came by EMS and was thought to be wheezing so was given nebs and solumedrol.   Past Medical History  Diagnosis Date  . Gout   . Hypertension   . Renal insufficiency     CKD stage III, last creatinine 6-10 was 3  . Impotence   . Former smoker     quit 1988  . Diabetes mellitus     type II  . H/O: CVA (cardiovascular accident)   . Depression   . Hyperlipidemia   . Osteoarthritis    Past Surgical History  Procedure Laterality Date  . Back surgery    . Small lesion excised from back of right hand    . Lower atrial doppler  02-13-04  . Electrocardiogram  08-04-99  . Stress cardiolite  08-13-99   Family History  Problem Relation Age of Onset  . Coronary artery disease Neg Hx    History  Substance Use Topics  . Smoking status: Former Smoker    Quit date: 07/19/1986  . Smokeless tobacco: Former Neurosurgeon  . Alcohol Use: No    Review of Systems  Respiratory: Positive for shortness of breath.     Allergies  Review of patient's allergies indicates no known allergies.  Home Medications   Current Outpatient Rx  Name  Route  Sig  Dispense  Refill  . allopurinol (ZYLOPRIM) 300 MG tablet   Oral   Take 150 mg by mouth daily.           Marland Kitchen amLODipine (NORVASC) 10 MG tablet   Oral   Take 10 mg by mouth daily.         Marland Kitchen aspirin EC 325 MG tablet   Oral  Take 325 mg by mouth daily.         Marland Kitchen atorvastatin (LIPITOR) 10 MG tablet   Oral   Take 10 mg by mouth daily.         . calcitRIOL (ROCALTROL) 0.25 MCG capsule   Oral   Take 0.25 mcg by mouth daily.           . carvedilol (COREG) 25 MG tablet   Oral   Take 25 mg by mouth 2 (two) times daily with a meal.         . cloNIDine (CATAPRES) 0.3 MG tablet   Oral   Take 0.3 mg by mouth 3 (three) times daily.         Marland Kitchen doxazosin (CARDURA) 4 MG tablet   Oral   Take 4 mg by mouth at bedtime.         . fosinopril (MONOPRIL) 40 MG tablet   Oral   Take 40 mg by mouth daily.         . furosemide (LASIX) 80 MG tablet   Oral   Take 80-160 mg by  mouth See admin instructions. Takes 160mg  in the morning and 80mg  in the evening         . gabapentin (NEURONTIN) 300 MG capsule   Oral   Take 1 capsule (300 mg total) by mouth 3 (three) times daily.   90 capsule   5   . insulin glargine (LANTUS) 100 UNIT/ML injection   Subcutaneous   Inject 55 Units into the skin at bedtime.         . insulin lispro (HUMALOG) 100 UNIT/ML injection   Subcutaneous   Inject 40-45 Units into the skin See admin instructions. Take 40 units before breakfast and lunch. Tal\ke 45 units before supper.         . potassium chloride SA (K-DUR,KLOR-CON) 20 MEQ tablet   Oral   Take 20 mEq by mouth 2 (two) times daily.         . promethazine (PHENERGAN) 12.5 MG tablet   Oral   Take 1 tablet (12.5 mg total) by mouth every 6 (six) hours as needed for nausea.   30 tablet   0   . sodium bicarbonate 650 MG tablet   Oral   Take 650 mg by mouth 3 (three) times daily.         . B-D INS SYRINGE 0.5CC/30GX1/2" 30G X 1/2" 0.5 ML MISC      USE 3 PER DAY AS DIRECTED   360 each   11    BP 150/57  Pulse 67  Temp(Src) 98.1 F (36.7 C) (Oral)  Resp 16  SpO2 98% Physical Exam  Nursing note and vitals reviewed. Constitutional: He is oriented to person, place, and time.  Chronically ill, slightly  tachypneic, talking in full sentences   HENT:  Head: Normocephalic.  Mouth/Throat: Oropharynx is clear and moist.  Eyes: Conjunctivae are normal. Pupils are equal, round, and reactive to light.  Neck: Normal range of motion. Neck supple.  Cardiovascular: Normal rate, regular rhythm and normal heart sounds.   Pulmonary/Chest:  Slightly tachypneic, + crackles bilateral bases   Abdominal: Soft. Bowel sounds are normal. He exhibits no distension. There is no tenderness. There is no rebound.  Musculoskeletal: Normal range of motion.  2+ edema   Neurological: He is alert and oriented to person, place, and time.  Skin: Skin is warm.  Psychiatric: He has a normal mood and affect. His behavior is normal. Judgment and thought content normal.    ED Course  Procedures (including critical care time) Labs Review Labs Reviewed  CBC - Abnormal; Notable for the following:    RBC 3.89 (*)    Hemoglobin 11.0 (*)    HCT 33.5 (*)    RDW 16.3 (*)    All other components within normal limits  BASIC METABOLIC PANEL - Abnormal; Notable for the following:    Glucose, Bld 181 (*)    BUN 52 (*)    Creatinine, Ser 2.92 (*)    GFR calc non Af Amer 18 (*)    GFR calc Af Amer 21 (*)    All other components within normal limits  PRO B NATRIURETIC PEPTIDE - Abnormal; Notable for the following:    Pro B Natriuretic peptide (BNP) 914.1 (*)    All other components within normal limits  POCT I-STAT TROPONIN I   Imaging Review Dg Chest Port 1 View  07/23/2013   CLINICAL DATA:  Shortness of breath  EXAM: PORTABLE CHEST - 1 VIEW  COMPARISON:  02/22/2007  FINDINGS: Diffuse interstitial coarsening with cephalized blood  flow. Vascular pedicle widening. No cardiomegaly. Possible small effusions. No pneumothorax.  IMPRESSION: Pulmonary edema.   Electronically Signed   By: Tiburcio Pea M.D.   On: 07/23/2013 03:24    EKG Interpretation    Date/Time:  Monday July 23 2013 02:39:14 EST Ventricular Rate:  75 PR  Interval:  158 QRS Duration: 85 QT Interval:  421 QTC Calculation: 470 R Axis:   62 Text Interpretation:  Sinus rhythm No previous ECGs available Confirmed by YAO  MD, DAVID ([id number]) on 07/23/2013 2:47:23 AM            MDM  No diagnosis found. RIDGELY ANASTACIO is a 80 y.o. male here with SOB. Likely new onset CHF vs COPD. Will get labs, BNP, CXR. Will likely need admission.   4:14 AM BNP elevated, CXR showed pulm edema. Given lasix 40mg  IV. Will admit to tele for new onset CHF.   Richardean Canal, MD 07/23/13 [phone]

## 2013-07-23 NOTE — Progress Notes (Signed)
Echocardiogram 2D Echocardiogram has been performed.  Casey Grimes 07/23/2013, 1:11 PM

## 2013-07-23 NOTE — Progress Notes (Signed)
I have seen and assessed patient and agree with Junious SilkAllison Ellis, NP assessment and plan.patient currently feeling better. Lasix dose was increased to 120 mg IV 3 times a day. Monitor renal function closely.

## 2013-07-23 NOTE — ED Notes (Addendum)
[facility]  Pt sent to treatment room from triage on arrival.  Pt woke up approx 1 1/2 hours ago with sob.  On EMS arrival pt with R sided wheezing.  EMS administered Albuterol 5mg  x 2 and Solumedrol 125mg  IV.  Pt denies chest pain.  No history of same per EMS.

## 2013-07-23 NOTE — H&P (Signed)
PCP:   Illene RegulusMichael Norins, MD   Chief Complaint:  sob  HPI: 80 yo male h/o htn, dm, ckd comes in with worsening sob for several days that awoke him tonight from sleep.  No cp.  No fevers.  No cough.  Has been having worsening le edema and swelling since oct.  Denies any h/o chf in past.  No n/v/d.  Has not diuresed since given lasix 40mg  in ED, however he is on significant amt at home chroniically.  Review of Systems:  Positive and negative as per HPI otherwise all other systems are negative  Past Medical History: Past Medical History  Diagnosis Date  . Gout   . Hypertension   . Renal insufficiency     CKD stage III, last creatinine 6-10 was 3  . Impotence   . Former smoker     quit 1988  . Diabetes mellitus     type II  . H/O: CVA (cardiovascular accident)   . Depression   . Hyperlipidemia   . Osteoarthritis    Past Surgical History  Procedure Laterality Date  . Back surgery    . Small lesion excised from back of right hand    . Lower atrial doppler  02-13-04  . Electrocardiogram  08-04-99  . Stress cardiolite  08-13-99    Medications: Prior to Admission medications   Medication Sig Start Date End Date Taking? Authorizing Provider  allopurinol (ZYLOPRIM) 300 MG tablet Take 150 mg by mouth daily.     Yes Historical Provider, MD  amLODipine (NORVASC) 10 MG tablet Take 10 mg by mouth daily.   Yes Historical Provider, MD  aspirin EC 325 MG tablet Take 325 mg by mouth daily.   Yes Historical Provider, MD  atorvastatin (LIPITOR) 10 MG tablet Take 10 mg by mouth daily.   Yes Historical Provider, MD  calcitRIOL (ROCALTROL) 0.25 MCG capsule Take 0.25 mcg by mouth daily.     Yes Historical Provider, MD  carvedilol (COREG) 25 MG tablet Take 25 mg by mouth 2 (two) times daily with a meal.   Yes Historical Provider, MD  cloNIDine (CATAPRES) 0.3 MG tablet Take 0.3 mg by mouth 3 (three) times daily.   Yes Historical Provider, MD  doxazosin (CARDURA) 4 MG tablet Take 4 mg by mouth at  bedtime.   Yes Historical Provider, MD  fosinopril (MONOPRIL) 40 MG tablet Take 40 mg by mouth daily.   Yes Historical Provider, MD  furosemide (LASIX) 80 MG tablet Take 80-160 mg by mouth See admin instructions. Takes 160mg  in the morning and 80mg  in the evening   Yes Historical Provider, MD  gabapentin (NEURONTIN) 300 MG capsule Take 1 capsule (300 mg total) by mouth 3 (three) times daily. 06/06/13  Yes Jacques NavyMichael E Norins, MD  insulin glargine (LANTUS) 100 UNIT/ML injection Inject 55 Units into the skin at bedtime.   Yes Historical Provider, MD  insulin lispro (HUMALOG) 100 UNIT/ML injection Inject 40-45 Units into the skin See admin instructions. Take 40 units before breakfast and lunch. Tal\ke 45 units before supper.   Yes Historical Provider, MD  potassium chloride SA (K-DUR,KLOR-CON) 20 MEQ tablet Take 20 mEq by mouth 2 (two) times daily.   Yes Historical Provider, MD  promethazine (PHENERGAN) 12.5 MG tablet Take 1 tablet (12.5 mg total) by mouth every 6 (six) hours as needed for nausea. 05/10/13  Yes Jacques NavyMichael E Norins, MD  sodium bicarbonate 650 MG tablet Take 650 mg by mouth 3 (three) times daily.   Yes Historical  Provider, MD  B-D INS SYRINGE 0.5CC/30GX1/2" 30G X 1/2" 0.5 ML MISC USE 3 PER DAY AS DIRECTED 06/29/13   Jacques Navy, MD    Allergies:  No Known Allergies  Social History:  reports that he quit smoking about 27 years ago. He has quit using smokeless tobacco. He reports that he does not drink alcohol or use illicit drugs.  Family History: Family History  Problem Relation Age of Onset  . Coronary artery disease Neg Hx     Physical Exam: Filed Vitals:   07/23/13 0315 07/23/13 0330 07/23/13 0345 07/23/13 0400  BP: 150/77 133/77 145/58 150/57  Pulse: 69 68 66 67  Temp:      TempSrc:      Resp: 22 23 19 16   SpO2: 98% 99% 98% 98%   General appearance: alert, cooperative and mild distress Head: Normocephalic, without obvious abnormality, atraumatic Eyes:  negative Nose: Nares normal. Septum midline. Mucosa normal. No drainage or sinus tenderness. Neck: no JVD and supple, symmetrical, trachea midline Lungs: clear to auscultation bilaterally Heart: regular rate and rhythm, S1, S2 normal, no murmur, click, rub or gallop Abdomen: soft, non-tender; bowel sounds normal; no masses,  no organomegaly Extremities: extremities normal, atraumatic, no cyanosis or edema Pulses: 2+ and symmetric Skin: Skin color, texture, turgor normal. No rashes or lesions Neurologic: Grossly normal   Labs on Admission:   Recent Labs  07/23/13 0255  NA 141  K 5.0  CL 102  CO2 25  GLUCOSE 181*  BUN 52*  CREATININE 2.92*  CALCIUM 9.2    Recent Labs  07/23/13 0255  WBC 6.7  HGB 11.0*  HCT 33.5*  MCV 86.1  PLT 300   Radiological Exams on Admission: Dg Chest Port 1 View  07/23/2013   CLINICAL DATA:  Shortness of breath  EXAM: PORTABLE CHEST - 1 VIEW  COMPARISON:  02/22/2007  FINDINGS: Diffuse interstitial coarsening with cephalized blood flow. Vascular pedicle widening. No cardiomegaly. Possible small effusions. No pneumothorax.  IMPRESSION: Pulmonary edema.   Electronically Signed   By: Tiburcio Pea M.D.   On: 07/23/2013 03:24    Assessment/Plan 80 yo male with chf exacerbation   Principal Problem:   CHF, acute- cannot find prev echo done.  Will place on chf pathway.  Place on lasix 80mg  q 12 and adjust according to clinical response.  Ck echo in am.  Serial cardiac enzymes.    Active Problems:   DIABETES MELLITUS, TYPE II   HYPERTENSION   CKD (chronic kidney disease), stage III   SOB (shortness of breath)   CHF (congestive heart failure)  pcp is dr Debby Bud, message left on voicemail for [facility].  Jakavion Bilodeau A 07/23/2013, 4:16 AM

## 2013-07-24 DIAGNOSIS — J810 Acute pulmonary edema: Secondary | ICD-10-CM

## 2013-07-24 LAB — CBC
HEMATOCRIT: 28.3 % — AB (ref 39.0–52.0)
HEMOGLOBIN: 9.3 g/dL — AB (ref 13.0–17.0)
MCH: 28.1 pg (ref 26.0–34.0)
MCHC: 32.9 g/dL (ref 30.0–36.0)
MCV: 85.5 fL (ref 78.0–100.0)
Platelets: 269 10*3/uL (ref 150–400)
RBC: 3.31 MIL/uL — ABNORMAL LOW (ref 4.22–5.81)
RDW: 16.5 % — ABNORMAL HIGH (ref 11.5–15.5)
WBC: 6.1 10*3/uL (ref 4.0–10.5)

## 2013-07-24 LAB — BASIC METABOLIC PANEL
BUN: 65 mg/dL — AB (ref 6–23)
CALCIUM: 9.2 mg/dL (ref 8.4–10.5)
CO2: 26 meq/L (ref 19–32)
Chloride: 97 mEq/L (ref 96–112)
Creatinine, Ser: 3.11 mg/dL — ABNORMAL HIGH (ref 0.50–1.35)
GFR calc Af Amer: 20 mL/min — ABNORMAL LOW (ref >90)
GFR, EST NON AFRICAN AMERICAN: 17 mL/min — AB (ref >90)
Glucose, Bld: 309 mg/dL — ABNORMAL HIGH (ref 70–99)
Potassium: 5 mEq/L (ref 3.7–5.3)
SODIUM: 137 meq/L (ref 137–147)

## 2013-07-24 LAB — IRON AND TIBC
IRON: 69 ug/dL (ref 42–135)
SATURATION RATIOS: 27 % (ref 20–55)
TIBC: 252 ug/dL (ref 215–435)
UIBC: 183 ug/dL (ref 125–400)

## 2013-07-24 LAB — GLUCOSE, CAPILLARY
Glucose-Capillary: 308 mg/dL — ABNORMAL HIGH (ref 70–99)
Glucose-Capillary: 248 mg/dL — ABNORMAL HIGH (ref 70–99)
Glucose-Capillary: 164 mg/dL — ABNORMAL HIGH (ref 70–99)
Glucose-Capillary: 80 mg/dL (ref 70–99)

## 2013-07-24 LAB — RETICULOCYTES
RBC.: 3.39 MIL/uL — ABNORMAL LOW (ref 4.22–5.81)
Retic Count, Absolute: 81.4 10*3/uL (ref 19.0–186.0)
Retic Ct Pct: 2.4 % (ref 0.4–3.1)

## 2013-07-24 LAB — HEMOGLOBIN A1C
Hgb A1c MFr Bld: 7.4 % — ABNORMAL HIGH (ref <5.7)
Mean Plasma Glucose: 166 mg/dL — ABNORMAL HIGH (ref <117)

## 2013-07-24 LAB — PRO B NATRIURETIC PEPTIDE: Pro B Natriuretic peptide (BNP): 1745 pg/mL — ABNORMAL HIGH (ref 0–450)

## 2013-07-24 MED ORDER — INSULIN ASPART 100 UNIT/ML ~~LOC~~ SOLN
0.0000 [IU] | Freq: Three times a day (TID) | SUBCUTANEOUS | Status: DC
  Administered 2013-07-24: 4 [IU] via SUBCUTANEOUS
  Administered 2013-07-24 – 2013-07-25 (×2): 7 [IU] via SUBCUTANEOUS
  Administered 2013-07-25: 3 [IU] via SUBCUTANEOUS
  Administered 2013-07-26: 7 [IU] via SUBCUTANEOUS
  Administered 2013-07-26: 4 [IU] via SUBCUTANEOUS
  Administered 2013-07-26: 7 [IU] via SUBCUTANEOUS
  Administered 2013-07-27: 11 [IU] via SUBCUTANEOUS

## 2013-07-24 NOTE — Clinical Documentation Improvement (Signed)
  Note 1/5 states "preserved systolic function". ECHO 1/5 states "grade 1 diastolic dysfunction" with EF 21-30%55-60%. Please clarify in Notes and DC summary probable/suspected type and acuity of CHF. Thank you.  Possible Clinical Conditions? - Chronic Systolic Congestive Heart Failure - Chronic Diastolic Congestive Heart Failure - Chronic Systolic & Diastolic Congestive Heart Failure - Acute Systolic Congestive Heart Failure - Acute Diastolic Congestive Heart Failure - Acute Systolic & Diastolic Congestive Heart Failure - Acute on Chronic Systolic Congestive Heart Failure - Acute on Chronic Diastolic Congestive Heart Failure - Acute on Chronic Systolic & Diastolic Congestive Heart Failure - Other Condition (please specify)   Supporting Information: - CXR: acute pulmonary edema on admit, ProBNP 914 1/5 - Treatment: "adjusted Lasix to higher dose: 120 mg IV 3 times a day", O2  Thank You, Beverley FiedlerLaurie E Kelton Bultman ,RN Clinical Documentation Specialist:  [phone]  [facility]- Health Information Management

## 2013-07-24 NOTE — Progress Notes (Signed)
Inpatient Diabetes Program Recommendations  AACE/ADA: New Consensus Statement on Inpatient Glycemic Control (2013)  Target Ranges:  Prepandial:   less than 140 mg/dL      Peak postprandial:   less than 180 mg/dL (1-2 hours)      Critically ill patients:  140 - 180 mg/dL   Inpatient Diabetes Program Recommendations Insulin - Meal Coverage: add Novolog 5 units TID with meals  HgbA1C: pending Thank you  Piedad ClimesGina Markus Casten BSN, RN,CDE Inpatient Diabetes Coordinator [phone] (team pager)

## 2013-07-24 NOTE — Progress Notes (Signed)
Upon entering pt's room. Pt was noted to have emesis on gown. Pt cleaned up and Zofran given. Pt did not complain of any SOB, pain, or any other symptoms prior to emesis. Will continue to monitor pt closely.

## 2013-07-24 NOTE — Progress Notes (Signed)
[facility] PROGRESS NOTE  Casey Grimes GQQ:[id number] DOB: [birth year] DOA: 07/23/2013 PCP: Illene Regulus, MD  Brief narrative:  80 year old male patient with history of hypertension, diabetes and chronic kidney disease who presents with progressive shortness of breath. Experienced paroxysmal nocturnal dyspnea during the night which is why he presented to the hospital. No constitutional symptoms endorsed. Noted progressive lower sternum the edema since October. No prior documented history of CHF. Review of outpatient cardiology notes should the patient have preserved systolic function per [facility] in 2010. Patient is normally on high-dose oral Lasix at home 160 mg in the morning and 80 mg at night. He was given Lasix 40 mg IV in the ER but had not diuresed with that dosage. Chest x-ray done after admission revealed bilateral pulmonary edema. Patient appears to have progressive chronic kidney disease over the past several years and is now at stage IV. He does not normal utilize oxygen at home.        Assessment/Plan: Acute pulmonary edema/Acute respiratory failure with hypoxia  -Clinical improvement. Continue supportive care with oxygen  -Continue Lasix 120 mg IV 3 times a day  -Since admission patient has diuresed -3696cc  -Suspect etiology due to progressive renal dysfunction but as precaution 2-D echocardiogram was done with EF of 55-60%. No wall motion abnormalities.  HYPERTENSION  -Moderate control  -Continue carvedilol, Catapres, Cardura  -It appears as if ACE inhibitor was held at admission but likely this can be resumed  CKD (chronic kidney disease) stage 4, GFR 15-29 ml/min  -As noted appears to have progressed  -In discussion with the patient he states he has been evaluated by nephrology in the past; was told that he might need "that thing put in his arm for dialysis" but he says he does not wish to have that done and does not wish to pursue dialysis  -Likely would  benefit from eventual palliative medicine evaluation-if rebounds quickly this admission this can be deferred to patient's primary care physician  -Continue oral bicarbonate  DIABETES MELLITUS, TYPE II  -Continue home Lantus and sliding scale insulin as needed  -Was on high-dose meal coverage at home which points to a possibility patient not adherent to dietary restrictions  -Hemoglobin A1c was 7.6 in October  Diabetic peripheral neuropathy  Anemia  -? Due to CKD  -Anemia panel consistent with anemia of chronic disease.  -may benefit from Aranesp  DVT prophylaxis: Heparin.    Code Status: Full Family Communication: Updated patient no family at bedside. Disposition Plan: Home versus SNF when medically stable.   Consultants:  None  Procedures:  Chest x-ray 07/23/2013  2-D echo 07/23/2013  Antibiotics:  None  HPI/Subjective: Patient states shortness of breath has improved. Patient complaining of some nausea.  Objective: Filed Vitals:   07/24/13 1814  BP: 144/57  Pulse:   Temp:   Resp:     Intake/Output Summary (Last 24 hours) at 07/24/13 2109 Last data filed at 07/24/13 1919  Gross per 24 hour  Intake    360 ml  Output   3000 ml  Net  -2640 ml   Filed Weights   07/23/13 0648 07/24/13 0438  Weight: 114.443 kg (252 lb 4.8 oz) 114.8 kg (253 lb 1.4 oz)    Exam:   General:  NAD  Cardiovascular: RRR  Respiratory: Some bibasilar crackles.  Abdomen: Soft, nontender, nondistended, positive bowel sounds.  Musculoskeletal: No clubbing or cyanosis. 2+ bilateral lower extremity edema  Data Reviewed: Basic Metabolic Panel:  Recent Labs Lab  07/23/13 0255 07/23/13 0750 07/24/13 0705  NA 141  --  137  K 5.0  --  5.0  CL 102  --  97  CO2 25  --  26  GLUCOSE 181*  --  309*  BUN 52*  --  65*  CREATININE 2.92* 2.94* 3.11*  CALCIUM 9.2  --  9.2   Liver Function Tests: No results found for this basename: AST, ALT, ALKPHOS, BILITOT, PROT, ALBUMIN,  in the  last 168 hours No results found for this basename: LIPASE, AMYLASE,  in the last 168 hours No results found for this basename: AMMONIA,  in the last 168 hours CBC:  Recent Labs Lab 07/23/13 0255 07/23/13 0750 07/24/13 0705  WBC 6.7 5.9 6.1  HGB 11.0* 11.1* 9.3*  HCT 33.5* 34.1* 28.3*  MCV 86.1 86.3 85.5  PLT 300 300 269   Cardiac Enzymes:  Recent Labs Lab 07/23/13 0750 07/23/13 1233 07/23/13 1940  TROPONINI <0.30 <0.30 <0.30   BNP (last 3 results)  Recent Labs  07/23/13 0255 07/24/13 0705  PROBNP 914.1* 1745.0*   CBG:  Recent Labs Lab 07/23/13 1623 07/23/13 2159 07/24/13 0622 07/24/13 1229 07/24/13 1615  GLUCAP 375* 365* 308* 248* 164*    No results found for this or any previous visit (from the past 240 hour(s)).   Studies: Dg Chest Port 1 View  07/23/2013   CLINICAL DATA:  Shortness of breath  EXAM: PORTABLE CHEST - 1 VIEW  COMPARISON:  02/22/2007  FINDINGS: Diffuse interstitial coarsening with cephalized blood flow. Vascular pedicle widening. No cardiomegaly. Possible small effusions. No pneumothorax.  IMPRESSION: Pulmonary edema.   Electronically Signed   By: Tiburcio Pea M.D.   On: 07/23/2013 03:24    Scheduled Meds: . allopurinol  150 mg Oral Daily  . amLODipine  10 mg Oral Daily  . aspirin EC  325 mg Oral Daily  . atorvastatin  10 mg Oral Daily  . calcitRIOL  0.25 mcg Oral Daily  . carvedilol  25 mg Oral BID WC  . cloNIDine  0.3 mg Oral TID  . doxazosin  4 mg Oral QHS  . enoxaparin (LOVENOX) injection  30 mg Subcutaneous Q24H  . furosemide  120 mg Intravenous Q8H  . gabapentin  300 mg Oral TID  . insulin aspart  0-20 Units Subcutaneous TID WC  . insulin glargine  55 Units Subcutaneous QHS  . potassium chloride SA  20 mEq Oral BID  . sodium bicarbonate  650 mg Oral TID  . sodium chloride  3 mL Intravenous Q12H   Continuous Infusions:   Principal Problem:   Acute respiratory failure with hypoxia Active Problems:   DIABETES MELLITUS,  TYPE II   HYPERTENSION   CKD (chronic kidney disease) stage 4, GFR 15-29 ml/min   Diabetic peripheral neuropathy   Acute pulmonary edema   Anemia    Time spent: 40 minutes    Sydney Hasten M.D. [facility] Pager [phone]. If 7PM-7AM, please contact night-coverage at [email or web address], password [facility] 07/24/2013, 9:09 PM  LOS: 1 day

## 2013-07-25 LAB — BASIC METABOLIC PANEL
BUN: 73 mg/dL — ABNORMAL HIGH (ref 6–23)
CALCIUM: 9.1 mg/dL (ref 8.4–10.5)
CO2: 30 mEq/L (ref 19–32)
Chloride: 101 mEq/L (ref 96–112)
Creatinine, Ser: 3.51 mg/dL — ABNORMAL HIGH (ref 0.50–1.35)
GFR calc non Af Amer: 15 mL/min — ABNORMAL LOW (ref >90)
GFR, EST AFRICAN AMERICAN: 17 mL/min — AB (ref >90)
Glucose, Bld: 53 mg/dL — ABNORMAL LOW (ref 70–99)
POTASSIUM: 4.4 meq/L (ref 3.7–5.3)
SODIUM: 144 meq/L (ref 137–147)

## 2013-07-25 LAB — GLUCOSE, CAPILLARY
GLUCOSE-CAPILLARY: 211 mg/dL — AB (ref 70–99)
Glucose-Capillary: 58 mg/dL — ABNORMAL LOW (ref 70–99)
Glucose-Capillary: 68 mg/dL — ABNORMAL LOW (ref 70–99)
Glucose-Capillary: 130 mg/dL — ABNORMAL HIGH (ref 70–99)

## 2013-07-25 LAB — VITAMIN B12: Vitamin B-12: 438 pg/mL (ref 211–911)

## 2013-07-25 LAB — FERRITIN: Ferritin: 79 ng/mL (ref 22–322)

## 2013-07-25 LAB — HEMOGLOBIN A1C
Hgb A1c MFr Bld: 7.1 % — ABNORMAL HIGH (ref <5.7)
MEAN PLASMA GLUCOSE: 157 mg/dL — AB (ref <117)

## 2013-07-25 MED ORDER — HEPARIN SODIUM (PORCINE) 5000 UNIT/ML IJ SOLN
5000.0000 [IU] | Freq: Three times a day (TID) | INTRAMUSCULAR | Status: DC
  Administered 2013-07-25 – 2013-07-27 (×6): 5000 [IU] via SUBCUTANEOUS
  Filled 2013-07-25 (×9): qty 1

## 2013-07-25 MED ORDER — FUROSEMIDE 10 MG/ML IJ SOLN
40.0000 mg | Freq: Two times a day (BID) | INTRAMUSCULAR | Status: DC
  Administered 2013-07-26: 40 mg via INTRAVENOUS
  Filled 2013-07-25 (×3): qty 4

## 2013-07-25 NOTE — Progress Notes (Signed)
Pt taken off of O2. O2 sats 98% on RA.  Pt informed to alert nursing staff if pt has any difficulty breathing.  Will continue to monitor pt closely.

## 2013-07-25 NOTE — Progress Notes (Signed)
Inpatient Diabetes Program Recommendations  AACE/ADA: New Consensus Statement on Inpatient Glycemic Control (2013)  Target Ranges:  Prepandial:   less than 140 mg/dL      Peak postprandial:   less than 180 mg/dL (1-2 hours)      Critically ill patients:  140 - 180 mg/dL  Results for Casey Grimes, Casey Grimes (MRN [id number]) as of 07/25/2013 11:54  Ref. Range 07/25/2013 06:36 07/25/2013 06:55  Glucose-Capillary Latest Range: 70-99 mg/dL 58 (L) 68 (L)    Inpatient Diabetes Program Recommendations Insulin - Basal: Decrease Lantus to 45 units at HS Insulin - Meal Coverage: . HgbA1C: 7.4 Thank you  [name] BSN, RN,CDE Inpatient Diabetes Coordinator [phone] (team pager)

## 2013-07-25 NOTE — Progress Notes (Signed)
[facility]                                                                                Patient Demographics  Casey Grimes, is a 80 y.o. male, DOB - 09-23-1928, ZOX:[id number]  Admit date - 07/23/2013   Admitting Physician Haydee Monica, MD  Outpatient Primary MD for the patient is Illene Regulus, MD  LOS - 2   Chief Complaint  Patient presents with  . Shortness of Breath        Assessment & Plan    1. Acute respiratory failure with hypoxia and due to acute on chronic diastolic CHF EF 55% on recent echo gram this admission also consulted by fluid overload secondary to underlying chronic kidney disease stage IV. He is much better with IV Lasix and has diuresed well, currently not short of breath he will try to titrate off oxygen, reduce Lasix, continue fluid and salt restriction and monitor. Increase activity is better will discharge in the morning   2. CKD stage IV. He follows with Dr. Lowell Guitar for his renal issues. Baseline creatinine is anywhere between 3.2-3.5. His creatinine is top normal of his normal range. We will continue to monitor closely, for now cut back on diuresis. The new bicarbonate supplementation.   3. Essential hypertension. Blood pressure in good control on clonidine, Catapres, Norvasc, blood pressure is stable we will continue to monitor.   4. Diabetes mellitus type 2. He's currently on sliding scale insulin, sugars are stable. Lantus being held. Question that her compliance at home.  CBG (last 3)   Recent Labs  07/25/13 0636 07/25/13 0655 07/25/13 1108  GLUCAP 58* 68* 130*     Lab Results  Component Value Date   HGBA1C 7.4* 07/24/2013      5.AOCD - stable no acute issues.    6. Diabetic neuropathy stable on Neurontin.     7. Chronic gout on an ethanol continue.     8. BPH continue on Cardura, discontinue Foley catheter and monitor.     9. Dyslipidemia continue home dose statin unchanged       Code Status:  full  Family Communication:    Disposition Plan: home   Procedures Echo   Consults none   Medications  Scheduled Meds: . allopurinol  150 mg Oral Daily  . amLODipine  10 mg Oral Daily  . aspirin EC  325 mg Oral Daily  . atorvastatin  10 mg Oral Daily  . calcitRIOL  0.25 mcg Oral Daily  . carvedilol  25 mg Oral BID WC  . cloNIDine  0.3 mg Oral TID  . doxazosin  4 mg Oral QHS  . enoxaparin (LOVENOX) injection  30 mg Subcutaneous Q24H  . [START ON 07/26/2013] furosemide  40 mg Intravenous BID  . gabapentin  300 mg Oral TID  . insulin aspart  0-20 Units Subcutaneous TID WC  . potassium chloride SA  20 mEq Oral BID  . sodium bicarbonate  650 mg Oral TID  . sodium chloride  3 mL Intravenous Q12H   Continuous Infusions:  PRN Meds:.sodium chloride, acetaminophen, ondansetron (ZOFRAN) IV, promethazine, sodium chloride  DVT Prophylaxis  Lovenox  Will switch to heparin  Lab Results  Component Value Date   PLT 269 07/24/2013    Antibiotics    Anti-infectives   None          Subjective:   Fallou Seibold today has, No headache, No chest pain, No abdominal pain - No Nausea, No new weakness tingling or numbness, No Cough - SOB.    Objective:   Filed Vitals:   07/24/13 1651 07/24/13 1814 07/24/13 2244 07/25/13 0608  BP: 166/56 144/57 140/53 132/52  Pulse: 82  70 63  Temp:   98.4 F (36.9 C) 98.8 F (37.1 C)  TempSrc:   Oral Oral  Resp:   16 16  Height:      Weight:    112.6 kg (248 lb 3.8 oz)  SpO2:   96% 94%    Wt Readings from Last 3 Encounters:  07/25/13 112.6 kg (248 lb 3.8 oz)  07/05/13 112.038 kg (247 lb)  06/06/13 107.049 kg (236 lb)     Intake/Output Summary (Last 24 hours) at 07/25/13 1337 Last data filed at 07/25/13 1148  Gross per 24 hour  Intake    120 ml  Output   4050 ml  Net  -3930 ml    Exam Awake Alert, Oriented X 3, No new F.N deficits, Normal affect [place].AT,PERRAL Supple Neck,No JVD, No cervical lymphadenopathy appriciated.   Symmetrical Chest wall movement, Good air movement bilaterally, CTAB RRR,No Gallops,Rubs or new Murmurs, No Parasternal Heave +ve B.Sounds, Abd Soft, Non tender, No organomegaly appriciated, No rebound - guarding or rigidity. No Cyanosis, Clubbing , traceedema, No new Rash or bruise   Data Review   Micro Results No results found for this or any previous visit (from the past 240 hour(s)).  Radiology Reports Dg Chest Port 1 View  07/23/2013   CLINICAL DATA:  Shortness of breath  EXAM: PORTABLE CHEST - 1 VIEW  COMPARISON:  02/22/2007  FINDINGS: Diffuse interstitial coarsening with cephalized blood flow. Vascular pedicle widening. No cardiomegaly. Possible small effusions. No pneumothorax.  IMPRESSION: Pulmonary edema.   Electronically Signed   By: Tiburcio Pea M.D.   On: 07/23/2013 03:24    CBC  Recent Labs Lab 07/23/13 0255 07/23/13 0750 07/24/13 0705  WBC 6.7 5.9 6.1  HGB 11.0* 11.1* 9.3*  HCT 33.5* 34.1* 28.3*  PLT 300 300 269  MCV 86.1 86.3 85.5  MCH 28.3 28.1 28.1  MCHC 32.8 32.6 32.9  RDW 16.3* 16.3* 16.5*    Chemistries   Recent Labs Lab 07/23/13 0255 07/23/13 0750 07/24/13 0705 07/25/13 0309  NA 141  --  137 144  K 5.0  --  5.0 4.4  CL 102  --  97 101  CO2 25  --  26 30  GLUCOSE 181*  --  309* 53*  BUN 52*  --  65* 73*  CREATININE 2.92* 2.94* 3.11* 3.51*  CALCIUM 9.2  --  9.2 9.1   ------------------------------------------------------------------------------------------------------------------ estimated creatinine clearance is 20.2 ml/min (by C-G formula based on Cr of 3.51). ------------------------------------------------------------------------------------------------------------------  Recent Labs  07/24/13 0705  HGBA1C 7.4*   ------------------------------------------------------------------------------------------------------------------ No results found for this basename: CHOL, HDL, LDLCALC, TRIG, CHOLHDL, LDLDIRECT,  in the last 72  hours ------------------------------------------------------------------------------------------------------------------ No results found for this basename: TSH, T4TOTAL, FREET3, T3FREE, THYROIDAB,  in the last 72 hours ------------------------------------------------------------------------------------------------------------------  Recent Labs  07/24/13 0705  TIBC 252  IRON 69  RETICCTPCT 2.4    Coagulation profile No results found for this basename: INR, PROTIME,  in the last 168 hours  No  results found for this basename: DDIMER,  in the last 72 hours  Cardiac Enzymes  Recent Labs Lab 07/23/13 0750 07/23/13 1233 07/23/13 1940  TROPONINI <0.30 <0.30 <0.30   ------------------------------------------------------------------------------------------------------------------ No components found with this basename: POCBNP,      Time Spent in minutes 35   SINGH,PRASHANT K M.D on 07/25/2013 at 1:37 PM  Between 7am to 7pm - Pager - [phone]  After 7pm go to [email or web address] - password [email or web address]  And look for the night coverage person covering for me after hours  [facility] Office  [phone]

## 2013-07-25 NOTE — Evaluation (Signed)
Occupational Therapy Evaluation Patient Details Name: Casey Grimes MRN: [id number] DOB: Mar 17, 1929 Today's Date: 07/25/2013 Time: 8563-1497 OT Time Calculation (min): 24 min  OT Assessment / Plan / Recommendation History of present illness Pt admitted with acute respiratory failure with hypoxia.   Clinical Impression   Pt presents with balance deficits and gets short of breath with activity, but 02 sats remained in mid 90s.  Pt is not open to equipment that may assist him with LB ADL, conserve his energy, and assist with safety during showering.  No family available this visit.  Recommend HHOT for safety assessment.    OT Assessment  All further OT needs can be met in the next venue of care    Follow Up Recommendations  Home health OT;Supervision/Assistance - 24 hour    Barriers to Discharge      Equipment Recommendations   (pt is declining)    Recommendations for Other Services    Frequency       Precautions / Restrictions Precautions Precautions: Fall Restrictions Weight Bearing Restrictions: No   Pertinent Vitals/Pain No pain reported, VSS    ADL  Eating/Feeding: Independent Where Assessed - Eating/Feeding: Chair Grooming: Wash/dry hands;Supervision/safety Where Assessed - Grooming: Unsupported standing Upper Body Bathing: Supervision/safety Where Assessed - Upper Body Bathing: Unsupported standing Lower Body Bathing: Min guard Where Assessed - Lower Body Bathing: Supported standing Upper Body Dressing: Set up Where Assessed - Upper Body Dressing: Unsupported sitting Lower Body Dressing: Min guard Where Assessed - Lower Body Dressing: Unsupported sitting;Supported sit to stand Toilet Transfer: Min Psychiatric nurse Method: Sit to Loss adjuster, chartered: Regular height toilet;Grab bars Equipment Used: Gait belt Transfers/Ambulation Related to ADLs: min guard assist, holds furniture around room ADL Comments: Pt leans over to donn socks.  Not  interested in AE for LB ADL.  Pt stands to shower and steps over edge of tub.  Declining tub equipment.  Family does not supervise pt's tub transfer.  Will defer home safety eval to [facility] as no family here to discuss.    OT Diagnosis: Generalized weakness;Cognitive deficits  OT Problem List: Decreased activity tolerance;Impaired balance (sitting and/or standing);Decreased cognition;Decreased safety awareness;Decreased knowledge of use of DME or AE OT Treatment Interventions:     OT Goals(Current goals can be found in the care plan section) Acute Rehab OT Goals Patient Stated Goal: home with family  Visit Information  Last OT Received On: 07/25/13 Assistance Needed: +1 History of Present Illness: Pt admitted with acute respiratory failure with hypoxia.       Prior Functioning     Home Living Family/patient expects to be discharged to:: Private residence Living Arrangements: Children;Spouse/significant other;Other relatives Available Help at Discharge: Family;Available 24 hours/day Type of Home: House Home Access: Stairs to enter [employer] of Steps: 6 Entrance Stairs-Rails: Right Home Layout: One level Home Equipment: Cane - single point Prior Function Level of Independence: Independent Communication Communication: No difficulties Dominant Hand: Right         Vision/Perception Vision - History Baseline Vision: Wears glasses all the time Patient Visual Report: Other (comment) (glasses are at home)   Cognition  Cognition Arousal/Alertness: Awake/alert Behavior During Therapy: WFL for tasks assessed/performed Overall Cognitive Status: No family/caregiver present to determine baseline cognitive functioning    Extremity/Trunk Assessment Upper Extremity Assessment Upper Extremity Assessment: Overall WFL for tasks assessed Cervical / Trunk Assessment Cervical / Trunk Assessment: Normal     Mobility Transfers General transfer comment: min guard assist, pt  unsteady on feet  Exercise     Balance Balance Overall balance assessment: Needs assistance Sitting-balance support: Feet supported Sitting balance-Leahy Scale: Normal Standing balance support: No upper extremity supported Standing balance comment: supervision   End of Session OT - End of Session Activity Tolerance: Patient tolerated treatment well (appears SOB, but sats remained in mid 90s) Patient left: in chair;with call bell/phone within reach  GO     Malka So 07/25/2013, 2:14 PM [phone]

## 2013-07-25 NOTE — Care Management Note (Signed)
    Page 1 of 2   07/27/2013     4:15:26 PM   CARE MANAGEMENT NOTE 07/27/2013  Patient:  EZELL, POKE   Account Number:  [id number]  Date Initiated:  07/25/2013  Documentation initiated by:  Makelle Marrone  Subjective/Objective Assessment:   PT ADM ON 07/23/13 WITH CHF EXACERBATION.  PTA, PT RESIDES AT [place].     Action/Plan:   PT/OT RECOMMENDING HH FOLLOW UP AT DC.  WOULD ALSO RECOMMEND HHRN FOR CHF FOLLOW UP AT DC.   Anticipated DC Date:  07/27/2013   Anticipated DC Plan:  [place]  CM consult      [facility] Choice  HOME HEALTH  Resumption Of Svcs/PTA Provider   Choice offered to / List presented to:  C-3 Spouse        HH arranged  HH-1 RN  [place]   Status of service:  Completed, signed off Medicare Important Message given?   (If response is "NO", the following Medicare IM given date fields will be blank) Date Medicare IM given:   Date Additional Medicare IM given:    Discharge Disposition:  [place]  Per UR Regulation:  Reviewed for med. necessity/level of care/duration of stay  If discussed at [place] of Stay Meetings, dates discussed:    Comments:  07/27/13 Ramonita Koenig,RN,BSN [phone] PT [place].  RESUMPTION OF CARE ORDERS FAXED TO [facility] CARE AT [phone].  START OF CARE 24-48H POST DC DATE.  07/26/13 Jaylnn Ullery,RN,BSN [phone] MET WITH PT'S WIFE AND SON TO DISCUSS DC PLANS.  WIFE STATES PT ACTIVE WITH [facility] FOR [facility] AND P.T. PT/OT RECOMMENDING HH FOLLOW UP.   WILL NEED ORDERS FOR CONT HH CARE PRIOR TO DC; WILL NEED HHRN FOR CHF FOLLOW UP, HHPT AND HHOT.  WIFE DENIES ANY DME NEEDS FOR HOME.  WILL FOLLOW PROGRESS.

## 2013-07-25 NOTE — Evaluation (Signed)
Physical Therapy Evaluation Patient Details Name: Casey Grimes MRN: [id number] DOB: 07/03/1929 Today's Date: 07/25/2013 Time: [id number] PT Time Calculation (min): 16 min  PT Assessment / Plan / Recommendation History of Present Illness  Pt admitted with acute respiratory failure with hypoxia.  Clinical Impression  Patient demonstrates deficits in functional mobility as indicated below. Pt will benefit from continued skilled PT to address deficits and maximize independence. Will continue to see as indicated. rec HHPT upon discharge with family supervision and PRN assist.    PT Assessment  Patient needs continued PT services    Follow Up Recommendations  Home health PT          Equipment Recommendations  None recommended by PT       Frequency Min 3X/week    Precautions / Restrictions Precautions Precautions: Fall Restrictions Weight Bearing Restrictions: No   Pertinent Vitals/Pain No pain, SpO2 >96% on rm air at all times, DOE 3/4      Mobility  Transfers Overall transfer level: Needs assistance General transfer comment: min guard assist, pt unsteady on feet Ambulation/Gait Ambulation/Gait assistance: Supervision;Min guard Ambulation Distance (Feet): 220 Feet Assistive device: None Gait Pattern/deviations: Step-through pattern;Decreased stride length;Drifts right/left Gait velocity: decreased Gait velocity interpretation: <1.8 ft/sec, indicative of risk for recurrent falls General Gait Details: some instability noted        PT Diagnosis: Abnormality of gait;Generalized weakness  PT Problem List: Decreased activity tolerance;Decreased balance;Decreased mobility PT Treatment Interventions: DME instruction;Gait training;Stair training;Functional mobility training;Therapeutic activities;Therapeutic exercise;Balance training     PT Goals(Current goals can be found in the care plan section) Acute Rehab PT Goals Patient Stated Goal: home with family PT Goal  Formulation: With patient Time For Goal Achievement: 08/08/13 Potential to Achieve Goals: Good  Visit Information  Last PT Received On: 07/25/13 Assistance Needed: +1 History of Present Illness: Pt admitted with acute respiratory failure with hypoxia.       Prior Functioning  Home Living Family/patient expects to be discharged to:: Private residence Living Arrangements: Children;Spouse/significant other;Other relatives Available Help at Discharge: Family;Available 24 hours/day Type of Home: House Home Access: Stairs to enter [employer] CorporationEntrance Stairs-Number of Steps: 6 Entrance Stairs-Rails: Right Home Layout: One level Home Equipment: Cane - single point Prior Function Level of Independence: Independent Communication Communication: No difficulties Dominant Hand: Right    Cognition  Cognition Arousal/Alertness: Awake/alert Behavior During Therapy: WFL for tasks assessed/performed Overall Cognitive Status: No family/caregiver present to determine baseline cognitive functioning    Extremity/Trunk Assessment Upper Extremity Assessment Upper Extremity Assessment: Overall WFL for tasks assessed Cervical / Trunk Assessment Cervical / Trunk Assessment: Normal   Balance Balance Overall balance assessment: Needs assistance Sitting-balance support: Feet supported Sitting balance-Leahy Scale: Normal Standing balance support: No upper extremity supported Standing balance comment: supervision  End of Session  Pt left with OT [name]  GP     [name], [name] 07/25/2013, 3:38 PM [name], PT DPT  [phone]

## 2013-07-26 DIAGNOSIS — D649 Anemia, unspecified: Secondary | ICD-10-CM

## 2013-07-26 LAB — BASIC METABOLIC PANEL
BUN: 76 mg/dL — ABNORMAL HIGH (ref 6–23)
CHLORIDE: 97 meq/L (ref 96–112)
CO2: 29 mEq/L (ref 19–32)
Calcium: 8.6 mg/dL (ref 8.4–10.5)
Creatinine, Ser: 3.45 mg/dL — ABNORMAL HIGH (ref 0.50–1.35)
GFR calc non Af Amer: 15 mL/min — ABNORMAL LOW (ref >90)
GFR, EST AFRICAN AMERICAN: 17 mL/min — AB (ref >90)
Glucose, Bld: 194 mg/dL — ABNORMAL HIGH (ref 70–99)
POTASSIUM: 4.3 meq/L (ref 3.7–5.3)
Sodium: 139 mEq/L (ref 137–147)

## 2013-07-26 LAB — GLUCOSE, CAPILLARY
GLUCOSE-CAPILLARY: 154 mg/dL — AB (ref 70–99)
GLUCOSE-CAPILLARY: 250 mg/dL — AB (ref 70–99)
Glucose-Capillary: 239 mg/dL — ABNORMAL HIGH (ref 70–99)
Glucose-Capillary: 199 mg/dL — ABNORMAL HIGH (ref 70–99)

## 2013-07-26 MED ORDER — FUROSEMIDE 80 MG PO TABS: 80.0000 mg | ORAL_TABLET | Freq: Every day | ORAL | Status: DC

## 2013-07-26 MED ORDER — FUROSEMIDE 80 MG PO TABS
80.0000 mg | ORAL_TABLET | Freq: Two times a day (BID) | ORAL | Status: DC
  Administered 2013-07-26 – 2013-07-27 (×2): 80 mg via ORAL
  Filled 2013-07-26 (×4): qty 1

## 2013-07-26 NOTE — Progress Notes (Signed)
[facility]                                                                                Patient Demographics  Casey Grimes, is a 80 y.o. male, DOB - May 08, 1929, ZOX:[id number]  Admit date - 07/23/2013   Admitting Physician Haydee Monica, MD  Outpatient Primary MD for the patient is Illene Regulus, MD  LOS - 3   Chief Complaint  Patient presents with  . Shortness of Breath        Assessment & Plan    1. Acute respiratory failure with hypoxia and due to acute on chronic diastolic CHF EF 55% on recent echo gram this admission also consulted by fluid overload secondary to underlying chronic kidney disease stage IV. He is much better with IV Lasix and has diuresed well, currently not short of breath he will try to titrate off oxygen, continue fluid and salt restriction and monitor. Switch to PO lasix. Increase activity. Plan for discharge in am.   2. CKD stage IV. He follows with Dr. Lowell Guitar for his renal issues. HD has been discussed, patient is reluctant. Baseline creatinine is anywhere between 3.2-3.5. His creatinine is top normal of his normal range. Continue bicarb.  3. Essential hypertension. Blood pressure with fair control on clonidine, Catapres, Norvasc, blood pressure is stable we will continue to monitor.  4. Diabetes mellitus type 2. He's currently on sliding scale insulin, sugars are stable. Lantus being held due to hypoglycemia. Will resume at lower dose.  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 stable no acute issues.  6. Diabetic neuropathy stable on Neurontin.  8. BPH continue on Cardura  9. Dyslipidemia continue home dose statin unchanged  Code Status: full  Family Communication:  Spoke with patient at bedside  Disposition Plan: home in am   Procedures Echo   Consults  none   Medications  Scheduled Meds: . allopurinol  150 mg Oral Daily  . amLODipine  10 mg Oral Daily  . aspirin EC  325 mg Oral Daily  . atorvastatin  10 mg Oral Daily  . calcitRIOL  0.25 mcg Oral Daily  . carvedilol  25 mg Oral BID WC  . cloNIDine  0.3 mg Oral TID  . doxazosin  4 mg Oral QHS  . furosemide  40 mg Intravenous BID  . gabapentin  300 mg Oral TID  . heparin subcutaneous  5,000 Units Subcutaneous Q8H  . insulin aspart  0-20 Units Subcutaneous TID WC  . potassium chloride SA  20 mEq Oral BID  . sodium bicarbonate  650 mg Oral TID  . sodium chloride  3 mL Intravenous Q12H   Continuous Infusions:  PRN Meds:.sodium chloride, acetaminophen, ondansetron (ZOFRAN) IV, promethazine, sodium chloride  DVT Prophylaxis  Lovenox  Will switch to heparin  Lab Results  Component Value Date   PLT 269 07/24/2013    Antibiotics    Anti-infectives   None  Subjective:   Casey Grimes today no complaints.  No shortness of breath, chest pain or dysuria.   Objective:   Filed Vitals:   07/25/13 1636 07/25/13 2007 07/26/13 0437 07/26/13 1341  BP: 143/61 123/50 130/53 155/55  Pulse:  60 66 65  Temp:  97.9 F (36.6 C) 98.4 F (36.9 C) 97.2 F (36.2 C)  TempSrc:  Oral Oral Oral  Resp:  18 20 18   Height:      Weight:   112.6 kg (248 lb 3.8 oz)   SpO2:  97% 96% 100%    Wt Readings from Last 3 Encounters:  07/26/13 112.6 kg (248 lb 3.8 oz)  07/05/13 112.038 kg (247 lb)  06/06/13 107.049 kg (236 lb)     Intake/Output Summary (Last 24 hours) at 07/26/13 1508 Last data filed at 07/26/13 1400  Gross per 24 hour  Intake   1080 ml  Output   1175 ml  Net    -95 ml    Exam GEN: alert, no distress CV: RRR no mrg PULM: CTAB EXT: 1+ edema bilaterally  Data Review   Micro Results No results found for this or any previous visit (from the past 240 hour(s)).  Radiology Reports Dg Chest Port 1 View  07/23/2013   CLINICAL DATA:  Shortness of breath  EXAM:  PORTABLE CHEST - 1 VIEW  COMPARISON:  02/22/2007  FINDINGS: Diffuse interstitial coarsening with cephalized blood flow. Vascular pedicle widening. No cardiomegaly. Possible small effusions. No pneumothorax.  IMPRESSION: Pulmonary edema.   Electronically Signed   By: Tiburcio Pea M.D.   On: 07/23/2013 03:24    CBC  Recent Labs Lab 07/23/13 0255 07/23/13 0750 07/24/13 0705  WBC 6.7 5.9 6.1  HGB 11.0* 11.1* 9.3*  HCT 33.5* 34.1* 28.3*  PLT 300 300 269  MCV 86.1 86.3 85.5  MCH 28.3 28.1 28.1  MCHC 32.8 32.6 32.9  RDW 16.3* 16.3* 16.5*    Chemistries   Recent Labs Lab 07/23/13 0255 07/23/13 0750 07/24/13 0705 07/25/13 0309 07/26/13 0328  NA 141  --  137 144 139  K 5.0  --  5.0 4.4 4.3  CL 102  --  97 101 97  CO2 25  --  26 30 29   GLUCOSE 181*  --  309* 53* 194*  BUN 52*  --  65* 73* 76*  CREATININE 2.92* 2.94* 3.11* 3.51* 3.45*  CALCIUM 9.2  --  9.2 9.1 8.6   ------------------------------------------------------------------------------------------------------------------ estimated creatinine clearance is 20.5 ml/min (by C-G formula based on Cr of 3.45). ------------------------------------------------------------------------------------------------------------------  Recent Labs  07/24/13 0705 07/25/13 1450  HGBA1C 7.4* 7.1*   ------------------------------------------------------------------------------------------------------------------ No results found for this basename: CHOL, HDL, LDLCALC, TRIG, CHOLHDL, LDLDIRECT,  in the last 72 hours ------------------------------------------------------------------------------------------------------------------ No results found for this basename: TSH, T4TOTAL, FREET3, T3FREE, THYROIDAB,  in the last 72 hours ------------------------------------------------------------------------------------------------------------------  Recent Labs  07/24/13 0705  VITAMINB12 438  FERRITIN 79  TIBC 252  IRON 69  RETICCTPCT 2.4     Coagulation profile No results found for this basename: INR, PROTIME,  in the last 168 hours  No results found for this basename: DDIMER,  in the last 72 hours  Cardiac Enzymes  Recent Labs Lab 07/23/13 0750 07/23/13 1233 07/23/13 1940  TROPONINI <0.30 <0.30 <0.30   ------------------------------------------------------------------------------------------------------------------ No components found with this basename: POCBNP,      Time Spent in minutes 35   Dalton Molesworth M.D on 07/26/2013 at 3:08 PM  Between 7am to 7pm - Pager - [phone]  After 7pm go to [email or web address] - password [email or web address]  And look for the night coverage person covering for me after hours  [facility] Office  [phone]

## 2013-07-26 NOTE — Progress Notes (Addendum)
Physical Therapy Treatment Patient Details Name: Casey JoyLouis J Suttles MRN: [id number] DOB: 04/17/1929 Today's Date: 07/26/2013 Time: [id number] PT Time Calculation (min): 17 min  PT Assessment / Plan / Recommendation  History of Present Illness Pt admitted with acute respiratory failure with hypoxia.   PT Comments   Pt progressing well.  Discussed with pt using assistive device at home until pt/therapist agree pt is safe without it.  Follow Up Recommendations  Home health PT     Does the patient have the potential to tolerate intense rehabilitation     Barriers to Discharge        Equipment Recommendations  None recommended by PT    Recommendations for Other Services    Frequency Min 3X/week   Progress towards PT Goals Progress towards PT goals: Progressing toward goals  Plan Current plan remains appropriate    Precautions / Restrictions Precautions Precautions: Fall Restrictions Weight Bearing Restrictions: No   Pertinent Vitals/Pain sats remained in low 90's through out    Mobility  Bed Mobility Overal bed mobility: Modified Independent Transfers Overall transfer level: Needs assistance Transfers: Sit to/from Stand Sit to Stand: Supervision Ambulation/Gait Ambulation/Gait assistance: Min guard;Min assist (min on occaision on return trip when fatigued) Ambulation Distance (Feet): 240 Feet Assistive device: None Gait Pattern/deviations: Step-through pattern;Decreased step length - right;Decreased step length - left;Scissoring;Drifts right/left;Trunk flexed Gait velocity: decreased General Gait Details: instability that degraded with fatigue.  Discussed using the cane or RW at home until able to amb safely Stairs: Yes Stairs assistance: Min guard Stair Management: One rail Right;Alternating pattern;Step to pattern;Forwards Number of Stairs: 3 General stair comments: heavy use of rail    Exercises     PT Diagnosis:    PT Problem List:   PT Treatment Interventions:      PT Goals (current goals can now be found in the care plan section) Acute Rehab PT Goals PT Goal Formulation: With patient Time For Goal Achievement: 08/08/13 Potential to Achieve Goals: Good  Visit Information  Last PT Received On: 07/26/13 Assistance Needed: +1 History of Present Illness: Pt admitted with acute respiratory failure with hypoxia.    Subjective Data  Subjective: I use a cane some times and I have a rolling walker   Cognition  Cognition Arousal/Alertness: Awake/alert Behavior During Therapy: WFL for tasks assessed/performed Overall Cognitive Status: No family/caregiver present to determine baseline cognitive functioning    Balance  Balance Overall balance assessment: Needs assistance Standing balance support: No upper extremity supported Standing balance-Leahy Scale: Fair  End of Session PT - End of Session Activity Tolerance: Patient tolerated treatment well Patient left: Other (comment) (sitting EOB) Nurse Communication: Mobility status   GP     Khris Jansson, Eliseo GumKenneth V 07/26/2013, 7:08 PM 07/26/2013  [place] BingKen Malonie Tatum, PT [phone]  (pager)

## 2013-07-26 NOTE — Progress Notes (Signed)
Inpatient Diabetes Program Recommendations  AACE/ADA: New Consensus Statement on Inpatient Glycemic Control (2013)  Target Ranges:  Prepandial:   less than 140 mg/dL      Peak postprandial:   less than 180 mg/dL (1-2 hours)      Critically ill patients:  140 - 180 mg/dL   Results for Casey Grimes, Casey Grimes (MRN [id number]) as of 07/26/2013 14:14  Ref. Range 07/25/2013 11:08 07/25/2013 15:59 07/25/2013 21:48 07/26/2013 06:22 07/26/2013 11:26  Glucose-Capillary Latest Range: 70-99 mg/dL 409130 (H) 811211 (H) 914154 (H) 239 (H) 250 (H)   Lantus was discontinued and no dose given yesterday 1/7.  Patient still needs basal insulin but at a lower dose that will not cause hypoglycemia. Last recommendation was for Lantus 45 units. Will follow. Thank you  Casey Grimes BSN, RN,CDE Inpatient Diabetes Coordinator [phone] (team pager)

## 2013-07-27 DIAGNOSIS — E1149 Type 2 diabetes mellitus with other diabetic neurological complication: Secondary | ICD-10-CM

## 2013-07-27 DIAGNOSIS — M109 Gout, unspecified: Secondary | ICD-10-CM

## 2013-07-27 DIAGNOSIS — E1142 Type 2 diabetes mellitus with diabetic polyneuropathy: Secondary | ICD-10-CM

## 2013-07-27 LAB — CBC
HCT: 30.6 % — ABNORMAL LOW (ref 39.0–52.0)
Hemoglobin: 10.4 g/dL — ABNORMAL LOW (ref 13.0–17.0)
MCH: 28.7 pg (ref 26.0–34.0)
MCHC: 34 g/dL (ref 30.0–36.0)
MCV: 84.3 fL (ref 78.0–100.0)
PLATELETS: 284 10*3/uL (ref 150–400)
RBC: 3.63 MIL/uL — ABNORMAL LOW (ref 4.22–5.81)
RDW: 16.1 % — ABNORMAL HIGH (ref 11.5–15.5)
WBC: 6.7 10*3/uL (ref 4.0–10.5)

## 2013-07-27 LAB — BASIC METABOLIC PANEL
BUN: 74 mg/dL — ABNORMAL HIGH (ref 6–23)
BUN: 70 mg/dL — AB (ref 6–23)
CALCIUM: 8.5 mg/dL (ref 8.4–10.5)
CALCIUM: 8.7 mg/dL (ref 8.4–10.5)
CO2: 22 mEq/L (ref 19–32)
CO2: 26 mEq/L (ref 19–32)
CREATININE: 2.89 mg/dL — AB (ref 0.50–1.35)
Chloride: 99 mEq/L (ref 96–112)
Chloride: 99 mEq/L (ref 96–112)
Creatinine, Ser: 2.97 mg/dL — ABNORMAL HIGH (ref 0.50–1.35)
GFR calc Af Amer: 21 mL/min — ABNORMAL LOW (ref >90)
GFR calc Af Amer: 22 mL/min — ABNORMAL LOW (ref >90)
GFR calc non Af Amer: 18 mL/min — ABNORMAL LOW (ref >90)
GFR, EST NON AFRICAN AMERICAN: 19 mL/min — AB (ref >90)
GLUCOSE: 297 mg/dL — AB (ref 70–99)
Glucose, Bld: 264 mg/dL — ABNORMAL HIGH (ref 70–99)
Potassium: 5.2 mEq/L (ref 3.7–5.3)
Potassium: 4.3 mEq/L (ref 3.7–5.3)
SODIUM: 137 meq/L (ref 137–147)
Sodium: 139 mEq/L (ref 137–147)

## 2013-07-27 LAB — GLUCOSE, CAPILLARY
Glucose-Capillary: 251 mg/dL — ABNORMAL HIGH (ref 70–99)
Glucose-Capillary: 291 mg/dL — ABNORMAL HIGH (ref 70–99)

## 2013-07-27 LAB — FOLATE

## 2013-07-27 MED ORDER — INSULIN LISPRO 100 UNIT/ML ~~LOC~~ SOLN: 10.0000 [IU] | SUBCUTANEOUS | Status: DC

## 2013-07-27 MED ORDER — INSULIN GLARGINE 100 UNIT/ML ~~LOC~~ SOLN: 30.0000 [IU] | Freq: Every day | SUBCUTANEOUS | Status: DC

## 2013-07-27 NOTE — Discharge Instructions (Signed)
Please follow up with your primary care doctor, Dr. Arthur HolmsNorrins and your kidney doctor Dr. Lowell GuitarPowell within two weeks.

## 2013-07-27 NOTE — Discharge Summary (Signed)
Physician Discharge Summary  Casey Grimes OAC:[id number] DOB: February 21, 1929 DOA: 07/23/2013  PCP: Illene Regulus, MD  Admit date: 07/23/2013 Discharge date: 07/27/2013  Time spent: 55 minutes  Recommendations for Outpatient Follow-up:  1. Follow up with Dr Arthur Holms within 2 weeks of dicharge.  At that time cbg's should be discussed as patient is discharged on lower dose of insulin than prior to discharge. BMET should be checked to follow renal function.  2. Follow up with Dr. Lowell Guitar, nephrology  Discharge Diagnoses:  Principal Problem:   Acute respiratory failure with hypoxia Active Problems:   DIABETES MELLITUS, TYPE II   HYPERTENSION   CKD (chronic kidney disease) stage 4, GFR 15-29 ml/min   Diabetic peripheral neuropathy   Acute pulmonary edema   Anemia   Discharge Condition: good  Diet recommendation: renal diet  Filed Weights   07/25/13 0608 07/26/13 0437 07/27/13 0458  Weight: 112.6 kg (248 lb 3.8 oz) 112.6 kg (248 lb 3.8 oz) 112.3 kg (247 lb 9.2 oz)    History of present illness:  80 yo male h/o htn, dm, ckd comes in with worsening sob for several days that awoke him tonight from sleep. No cp. No fevers. No cough. Has been having worsening le edema and swelling since oct. Denies any h/o chf in past. No n/v/d. Has not diuresed since given lasix 40mg  in ED, however he is on significant amt at home chroniically.  Hospital Course:  1.   Acute respiratory failure with hypoxia: CXr on presentation showed pulmonary edema. BNP elevated at 1745.  ECHO shows normal ejection fraction of 55-60%, grade 1 diastolic dysfunction.  Volume overload as a result of renal dysfunction and diastolic dysfunction.  Treated with aggressive diuresis and has diuresed about 7 liters during admission, weight is down 2kg.  Currently breathing easily on room air.   2.   DIABETES MELLITUS, TYPE II with peripheral neuropathy: A1C 7.4 in hospital, indicating good home control. Was hypoglycemic in hospital, which  may reflect change in diet (carb modified in hospital verses onto controled at home).  Is being discharged on decreased doses of basal and meal time coverage and will need to monitor cbg's closely.  Will need to follow with PCP. 3.   HYPERTENSION: Fair control.  Continue with coreg, cardura, norvasc and catapres as well as lasix.   4.   CKD (chronic kidney disease) stage 4, GFR 15-29 ml/min: Again discussed severity of renal disease and potential need for hemodialysis in the near future. He is adamant that he will not start dialysis and he understands the implications.  He would like to continue to follow up with Dr. Lowell Guitar. Continue calcitriol, bicarb. 5.   Anemia: due to CKD. Anemia panel consistent with AOCD. Stable during admission. 6.  Gout: continue allopurinol 7. HLD: continue statin 8. Diabetic neuropathy: continue neurontin 9. BPH: continue cardura  Procedures: Chest x-ray 07/23/2013  2-D echo 07/23/2013   Consultations:  none  Discharge Exam: Filed Vitals:   07/27/13 0458  BP: 117/61  Pulse: 74  Temp: 98.6 F (37 C)  Resp: 18    General: alert, oriented, no distress,  Cardiovascular: RRR no MRG Respiratory: CTAB with good air moevement EXT: 1+ edema bilaterally  Discharge Instructions      Discharge Orders   Future Appointments Provider Department Dept Phone   07/31/2013 8:30 AM Jacques Navy, MD [facility] [phone]   Future Orders Complete By Expires   Call MD for:  difficulty breathing, headache or  visual disturbances  As directed    Call MD for:  extreme fatigue  As directed    Call MD for:  persistant dizziness or light-headedness  As directed    Call MD for:  persistant nausea and vomiting  As directed    Diet - low sodium heart healthy  As directed    Discharge instructions  As directed    Comments:     Keep follow up appointments. Keep track of daily weights.   Increase activity slowly  As directed         Medication List    STOP taking these medications       fosinopril 40 MG tablet  Commonly known as:  MONOPRIL      TAKE these medications       allopurinol 300 MG tablet  Commonly known as:  ZYLOPRIM  Take 150 mg by mouth daily.     amLODipine 10 MG tablet  Commonly known as:  NORVASC  Take 10 mg by mouth daily.     aspirin EC 325 MG tablet  Take 325 mg by mouth daily.     atorvastatin 10 MG tablet  Commonly known as:  LIPITOR  Take 10 mg by mouth daily.     B-D INS SYRINGE 0.5CC/30GX1/2" 30G X 1/2" 0.5 ML Misc  Generic drug:  Insulin Syringe-Needle U-100  USE 3 PER DAY AS DIRECTED     calcitRIOL 0.25 MCG capsule  Commonly known as:  ROCALTROL  Take 0.25 mcg by mouth daily.     carvedilol 25 MG tablet  Commonly known as:  COREG  Take 25 mg by mouth 2 (two) times daily with a meal.     cloNIDine 0.3 MG tablet  Commonly known as:  CATAPRES  Take 0.3 mg by mouth 3 (three) times daily.     doxazosin 4 MG tablet  Commonly known as:  CARDURA  Take 4 mg by mouth at bedtime.     furosemide 80 MG tablet  Commonly known as:  LASIX  Take 80-160 mg by mouth See admin instructions. Takes 160mg  in the morning and 80mg  in the evening     gabapentin 300 MG capsule  Commonly known as:  NEURONTIN  Take 1 capsule (300 mg total) by mouth 3 (three) times daily.     insulin glargine 100 UNIT/ML injection  Commonly known as:  LANTUS  Inject 0.3 mLs (30 Units total) into the skin at bedtime.     insulin lispro 100 UNIT/ML injection  Commonly known as:  HUMALOG  Inject 10 Units into the skin See admin instructions. Take 10 units before each meal     potassium chloride SA 20 MEQ tablet  Commonly known as:  K-DUR,KLOR-CON  Take 20 mEq by mouth 2 (two) times daily.     promethazine 12.5 MG tablet  Commonly known as:  PHENERGAN  Take 1 tablet (12.5 mg total) by mouth every 6 (six) hours as needed for nausea.     sodium bicarbonate 650 MG tablet  Take 650 mg by mouth 3  (three) times daily.       No Known Allergies Follow-up Information   Follow up with POWELL,ALVIN C, MD In 2 weeks.   Specialty:  Nephrology   Contact information:   [address]                          [place] [address] [phone]  Follow up with Illene Regulus, MD In 1 week.   Specialty:  Internal Medicine   Contact information:   [address] [place] [address] [phone]       Please follow up. (APPOINTMENT WITH DR Debby Bud ON JAN 13 TH @ 0830)        The results of significant diagnostics from this hospitalization (including imaging, microbiology, ancillary and laboratory) are listed below for reference.    Significant Diagnostic Studies: Dg Chest Port 1 View  07/23/2013   CLINICAL DATA:  Shortness of breath  EXAM: PORTABLE CHEST - 1 VIEW  COMPARISON:  02/22/2007  FINDINGS: Diffuse interstitial coarsening with cephalized blood flow. Vascular pedicle widening. No cardiomegaly. Possible small effusions. No pneumothorax.  IMPRESSION: Pulmonary edema.   Electronically Signed   By: Tiburcio Pea M.D.   On: 07/23/2013 03:24    Microbiology: No results found for this or any previous visit (from the past 240 hour(s)).   Labs: Basic Metabolic Panel:  Recent Labs Lab 07/24/13 0705 07/25/13 0309 07/26/13 0328 07/27/13 0545 07/27/13 1006  NA 137 144 139 137 139  K 5.0 4.4 4.3 5.2 4.3  CL 97 101 97 99 99  CO2 26 30 29 22 26   GLUCOSE 309* 53* 194* 297* 264*  BUN 65* 73* 76* 74* 70*  CREATININE 3.11* 3.51* 3.45* 2.97* 2.89*  CALCIUM 9.2 9.1 8.6 8.5 8.7   Liver Function Tests: No results found for this basename: AST, ALT, ALKPHOS, BILITOT, PROT, ALBUMIN,  in the last 168 hours No results found for this basename: LIPASE, AMYLASE,  in the last 168 hours No results found for this basename: AMMONIA,  in the last 168 hours CBC:  Recent Labs Lab 07/23/13 0255 07/23/13 0750 07/24/13 0705 07/27/13 0545  WBC 6.7 5.9 6.1 6.7  HGB 11.0* 11.1* 9.3*  10.4*  HCT 33.5* 34.1* 28.3* 30.6*  MCV 86.1 86.3 85.5 84.3  PLT 300 300 269 284   Cardiac Enzymes:  Recent Labs Lab 07/23/13 0750 07/23/13 1233 07/23/13 1940  TROPONINI <0.30 <0.30 <0.30   BNP: BNP (last 3 results)  Recent Labs  07/23/13 0255 07/24/13 0705  PROBNP 914.1* 1745.0*   CBG:  Recent Labs Lab 07/26/13 0622 07/26/13 1126 07/26/13 1629 07/26/13 2117 07/27/13 0615  GLUCAP 239* 250* 199* 251* 291*       Signed:  Roslind Michaux  [facility] 07/27/2013, 5:45 PM

## 2013-07-31 ENCOUNTER — Encounter: Payer: Self-pay | Admitting: Internal Medicine

## 2013-07-31 ENCOUNTER — Ambulatory Visit (INDEPENDENT_AMBULATORY_CARE_PROVIDER_SITE_OTHER): Payer: Medicare Other | Admitting: Internal Medicine

## 2013-07-31 ENCOUNTER — Other Ambulatory Visit: Payer: Medicare Other

## 2013-07-31 VITALS — BP 156/60 | HR 76 | Temp 98.4°F | Wt 249.0 lb

## 2013-07-31 DIAGNOSIS — I10 Essential (primary) hypertension: Secondary | ICD-10-CM

## 2013-07-31 DIAGNOSIS — N184 Chronic kidney disease, stage 4 (severe): Secondary | ICD-10-CM

## 2013-07-31 DIAGNOSIS — E119 Type 2 diabetes mellitus without complications: Secondary | ICD-10-CM

## 2013-07-31 DIAGNOSIS — I5032 Chronic diastolic (congestive) heart failure: Secondary | ICD-10-CM | POA: Insufficient documentation

## 2013-07-31 LAB — BASIC METABOLIC PANEL
BUN: 52 mg/dL — AB (ref 6–23)
CALCIUM: 9.3 mg/dL (ref 8.4–10.5)
CO2: 28 mEq/L (ref 19–32)
Chloride: 105 mEq/L (ref 96–112)
Creatinine, Ser: 2.9 mg/dL — ABNORMAL HIGH (ref 0.4–1.5)
GFR: 27.18 mL/min — AB (ref >60.00)
Glucose, Bld: 249 mg/dL — ABNORMAL HIGH (ref 70–99)
Potassium: 4.5 mEq/L (ref 3.5–5.1)
Sodium: 140 mEq/L (ref 135–145)

## 2013-07-31 MED ORDER — INSULIN GLARGINE 100 UNIT/ML ~~LOC~~ SOLN: 40.0000 [IU] | Freq: Every day | SUBCUTANEOUS | Status: DC

## 2013-07-31 NOTE — Assessment & Plan Note (Signed)
Recent episode of heart failure with pulmonary edema that responded to diuretic therapy - loss of 7 lbs over 4 days. 2 D echo with normal systolic function (pumping) but abnormal diastolic function (filling) that combined with kidney disease lead to the problem. Today your are doing well.  Plan Continue present medications as listed  "rule of 2's:" - weigh every day at the same time in the same garb: for a gain of 2+ lbs in 2 days double the lasix for 2 days. Also call.   Lab today to check potassium.

## 2013-07-31 NOTE — Progress Notes (Signed)
Subjective:    Patient ID: Casey Grimes, male    DOB: 03/31/1929, 80 y.o.   MRN: [id number]  HPI Casey Grimes presents for hospital follow up: he was admitted Jan 5th - 9th for pulmonary edema. Records reviewed: he had 2 D echo with EF 55-60%, grade I diastolic dysfunction. It was felt that the combination of diastolic failure and chronic kidney disease was the etiology. He diuresed 7 liters of fluid. At the time of discharge he was breathing much better.  During his stay his CBGs were low and his basal insulin was reduced. A1C was 7.4%.   His other medical conditions remained stable.   Since being home he has done well w/o SOB or DOE. CBGs have been a bit high. He was on Lantus 55 uints qHS and at discharge that was lowered to 30 units.  PMH, FamHx and SocHx reviewed for any changes and relevance.  Current Outpatient Prescriptions on File Prior to Visit  Medication Sig Dispense Refill  . allopurinol (ZYLOPRIM) 300 MG tablet Take 150 mg by mouth daily.        Marland Kitchen. amLODipine (NORVASC) 10 MG tablet Take 10 mg by mouth daily.      Marland Kitchen. aspirin EC 325 MG tablet Take 325 mg by mouth daily.      Marland Kitchen. atorvastatin (LIPITOR) 10 MG tablet Take 10 mg by mouth daily.      . B-D INS SYRINGE 0.5CC/30GX1/2" 30G X 1/2" 0.5 ML MISC USE 3 PER DAY AS DIRECTED  360 each  11  . calcitRIOL (ROCALTROL) 0.25 MCG capsule Take 0.25 mcg by mouth daily.        . carvedilol (COREG) 25 MG tablet Take 25 mg by mouth 2 (two) times daily with a meal.      . cloNIDine (CATAPRES) 0.3 MG tablet Take 0.3 mg by mouth 3 (three) times daily.      Marland Kitchen. doxazosin (CARDURA) 4 MG tablet Take 4 mg by mouth at bedtime.      . furosemide (LASIX) 80 MG tablet Take 80-160 mg by mouth See admin instructions. Takes 160mg  in the morning and 80mg  in the evening      . gabapentin (NEURONTIN) 300 MG capsule Take 1 capsule (300 mg total) by mouth 3 (three) times daily.  90 capsule  5  . insulin lispro (HUMALOG) 100 UNIT/ML injection Inject 10 Units  into the skin See admin instructions. Take 10 units before each meal  10 mL  11  . potassium chloride SA (K-DUR,KLOR-CON) 20 MEQ tablet Take 20 mEq by mouth 2 (two) times daily.      . promethazine (PHENERGAN) 12.5 MG tablet Take 1 tablet (12.5 mg total) by mouth every 6 (six) hours as needed for nausea.  30 tablet  0  . sodium bicarbonate 650 MG tablet Take 650 mg by mouth 3 (three) times daily.       No current facility-administered medications on file prior to visit.      Review of Systems System review is negative for any constitutional, cardiac, pulmonary, GI or neuro symptoms or complaints other than as described in the HPI.     Objective:   Physical Exam Filed Vitals:   07/31/13 0851  BP: 156/60  Pulse: 76  Temp: 98.4 F (36.9 C)   Wt Readings from Last 3 Encounters:  07/31/13 249 lb (112.946 kg)  07/27/13 247 lb 9.2 oz (112.3 kg)  07/05/13 247 lb (112.038 kg)   Gen'l- Elderly man in no  distress HEENT- C&S muddy Cor - 2+ radial pulse, RRR Pulm - no increased WOB, lungs CTAP Ext - 2+ distal LE edema Neuro - A&O       Assessment & Plan:

## 2013-07-31 NOTE — Progress Notes (Signed)
Pre visit review using our clinic review tool, if applicable. No additional management support is needed unless otherwise documented below in the visit note. 

## 2013-07-31 NOTE — Patient Instructions (Signed)
1. Diabetes - Last A1C 7.4% - good control. Your blood sugars since discharge are up in the AM  Plan Increased Lantus to 40 units at bedtime  Keep the CBG record and let me know if it is too low or too high.   2. Diastolic heart failure with fluid build up - Recent episode of heart failure with pulmonary edema that responded to diuretic therapy - loss of 7 lbs over 4 days. 2 D echo with normal systolic function (pumping) but abnormal diastolic function (filling) that combined with kidney disease lead to the problem. Today your are doing well.  Plan Continue present medications as listed  "rule of 2's:" - weigh every day at the same time in the same garb: for a gain of 2+ lbs in 2 days double the lasix for 2 days. Also call.   Lab today to check potassium.  3. Chronic kidney disease - not much change based on labs in the hospital  Plan Lab today to check potassium and kidney function  Dr. Lowell GuitarPowell will be our guide regarding treatment  I understand your decision about not wanting dialysis - there is plenty of medical management we can do.

## 2013-07-31 NOTE — Assessment & Plan Note (Signed)
Last A1C 7.4% - good control. Your blood sugars since discharge are up in the AM  Plan Increased Lantus to 40 units at bedtime  Keep the CBG record and let me know if it is too low or too high.

## 2013-08-05 ENCOUNTER — Encounter: Payer: Self-pay | Admitting: Internal Medicine

## 2013-08-06 ENCOUNTER — Telehealth: Payer: Self-pay | Admitting: Internal Medicine

## 2013-08-06 NOTE — Telephone Encounter (Signed)
Ok for home health to see patient

## 2013-08-06 NOTE — Telephone Encounter (Signed)
Tracey from [facility] called stating she need a verbal okay to still see this  Patient, would like a call back at [phone]

## 2013-08-13 NOTE — Telephone Encounter (Signed)
Received a phone call from [place] at [facility] [phone] requesting verbal okay to continue to see patient for 60 day re certification for cardiac pulmonary status still having fluid retention and sob. Per previous phone note I gave a verbal.

## 2013-08-25 DIAGNOSIS — I10 Essential (primary) hypertension: Secondary | ICD-10-CM

## 2013-08-25 DIAGNOSIS — E119 Type 2 diabetes mellitus without complications: Secondary | ICD-10-CM

## 2013-08-25 DIAGNOSIS — N184 Chronic kidney disease, stage 4 (severe): Secondary | ICD-10-CM

## 2013-08-25 DIAGNOSIS — I5032 Chronic diastolic (congestive) heart failure: Secondary | ICD-10-CM

## 2013-09-03 ENCOUNTER — Telehealth: Payer: Self-pay | Admitting: *Deleted

## 2013-09-03 NOTE — Telephone Encounter (Signed)
Neysa Bonitohristy, RN with [facility], phoned high blood sugars in 300's; not on sliding scale (10 units of regular insulin ac); lantus 40 units qhs, asymptomatic. Please advise.  CB#  [place]isure Knoll[place]risty, [place] [phone]

## 2013-09-03 NOTE — Telephone Encounter (Signed)
Jan 7th A1C 7.1%. No change in regimen at this time. Mrs. Clelia SchaumannMcGirt does a very good job of monitoring his CBGs

## 2013-09-05 ENCOUNTER — Telehealth: Payer: Self-pay | Admitting: *Deleted

## 2013-09-05 NOTE — Telephone Encounter (Signed)
Phoned and notified Christy, RN, of MD's response and no new orders at this time.

## 2013-09-05 NOTE — Telephone Encounter (Signed)
Spouse phoned stating patient's blood glucose had continued to climb and was presently 448 after having been in the 300's for the past 3 days.  Consulted with PCP and he stated for spouse to maintain a blood glucose diary over the next few days and re-call with those & at that time (with several days to review), PCP will readdress plan of care.  Spouse verbalized understanding.

## 2013-09-08 ENCOUNTER — Other Ambulatory Visit: Payer: Self-pay | Admitting: Internal Medicine

## 2013-10-08 ENCOUNTER — Other Ambulatory Visit: Payer: Self-pay | Admitting: Internal Medicine

## 2013-11-05 ENCOUNTER — Other Ambulatory Visit: Payer: Self-pay

## 2013-11-05 NOTE — Telephone Encounter (Signed)
Opened in error

## 2013-11-13 ENCOUNTER — Other Ambulatory Visit: Payer: Self-pay | Admitting: *Deleted

## 2013-11-13 MED ORDER — CARVEDILOL 25 MG PO TABS: 25.0000 mg | ORAL_TABLET | Freq: Two times a day (BID) | ORAL | Status: DC

## 2013-11-26 ENCOUNTER — Other Ambulatory Visit: Payer: Self-pay

## 2013-11-26 MED ORDER — FUROSEMIDE 80 MG PO TABS: 80.0000 mg | ORAL_TABLET | ORAL | Status: DC

## 2013-12-04 ENCOUNTER — Telehealth: Payer: Self-pay | Admitting: Internal Medicine

## 2013-12-04 MED ORDER — INSULIN GLARGINE 100 UNIT/ML ~~LOC~~ SOLN: 40.0000 [IU] | Freq: Every day | SUBCUTANEOUS | Status: DC

## 2013-12-04 NOTE — Telephone Encounter (Signed)
Patient called and informed Lantus sent in as requested.

## 2013-12-04 NOTE — Telephone Encounter (Signed)
Pt needs a refill on Lantus.  He was Dr. Norins pt and iDebby Buds on Dr. Lavonna MonarchKollar's list.  He uses [facility] on [address]  He is out of Lantus.

## 2013-12-11 ENCOUNTER — Other Ambulatory Visit: Payer: Self-pay | Admitting: *Deleted

## 2013-12-11 MED ORDER — GABAPENTIN 300 MG PO CAPS: 300.0000 mg | ORAL_CAPSULE | Freq: Three times a day (TID) | ORAL | Status: DC

## 2014-01-14 ENCOUNTER — Other Ambulatory Visit: Payer: Self-pay

## 2014-01-14 MED ORDER — ATORVASTATIN CALCIUM 10 MG PO TABS: 10.0000 mg | ORAL_TABLET | Freq: Every day | ORAL | Status: DC

## 2014-01-29 ENCOUNTER — Other Ambulatory Visit: Payer: Self-pay | Admitting: *Deleted

## 2014-01-29 MED ORDER — ATORVASTATIN CALCIUM 10 MG PO TABS: 10.0000 mg | ORAL_TABLET | Freq: Every day | ORAL | Status: DC

## 2014-01-29 NOTE — Telephone Encounter (Signed)
Pt has appt with Dr. Dorise HissKollar 03/26/14 will send enough until he establish with md.../lmb

## 2014-02-11 ENCOUNTER — Other Ambulatory Visit: Payer: Self-pay | Admitting: Internal Medicine

## 2014-02-26 ENCOUNTER — Other Ambulatory Visit: Payer: Self-pay | Admitting: *Deleted

## 2014-02-26 MED ORDER — POTASSIUM CHLORIDE CRYS ER 20 MEQ PO TBCR: 20.0000 meq | EXTENDED_RELEASE_TABLET | Freq: Two times a day (BID) | ORAL | Status: DC

## 2014-02-26 MED ORDER — DOXAZOSIN MESYLATE 4 MG PO TABS: ORAL_TABLET | ORAL | Status: DC

## 2014-03-24 ENCOUNTER — Other Ambulatory Visit: Payer: Self-pay | Admitting: Internal Medicine

## 2014-03-26 ENCOUNTER — Ambulatory Visit (INDEPENDENT_AMBULATORY_CARE_PROVIDER_SITE_OTHER): Payer: Medicare Other | Admitting: Internal Medicine

## 2014-03-26 ENCOUNTER — Other Ambulatory Visit (INDEPENDENT_AMBULATORY_CARE_PROVIDER_SITE_OTHER): Payer: Medicare Other

## 2014-03-26 ENCOUNTER — Encounter: Payer: Self-pay | Admitting: Internal Medicine

## 2014-03-26 VITALS — BP 162/68 | HR 68 | Temp 97.4°F | Resp 18 | Ht 70.0 in | Wt 218.8 lb

## 2014-03-26 DIAGNOSIS — N058 Unspecified nephritic syndrome with other morphologic changes: Secondary | ICD-10-CM

## 2014-03-26 DIAGNOSIS — Z0000 Encounter for general adult medical examination without abnormal findings: Secondary | ICD-10-CM

## 2014-03-26 DIAGNOSIS — E1129 Type 2 diabetes mellitus with other diabetic kidney complication: Secondary | ICD-10-CM

## 2014-03-26 DIAGNOSIS — Z23 Encounter for immunization: Secondary | ICD-10-CM

## 2014-03-26 DIAGNOSIS — I10 Essential (primary) hypertension: Secondary | ICD-10-CM

## 2014-03-26 DIAGNOSIS — E1121 Type 2 diabetes mellitus with diabetic nephropathy: Secondary | ICD-10-CM

## 2014-03-26 DIAGNOSIS — N184 Chronic kidney disease, stage 4 (severe): Secondary | ICD-10-CM

## 2014-03-26 LAB — BASIC METABOLIC PANEL
BUN: 65 mg/dL — ABNORMAL HIGH (ref 6–23)
CO2: 28 mEq/L (ref 19–32)
Calcium: 9.6 mg/dL (ref 8.4–10.5)
Chloride: 104 mEq/L (ref 96–112)
Creatinine, Ser: 3.2 mg/dL — ABNORMAL HIGH (ref 0.4–1.5)
GFR: 24.2 mL/min — AB (ref >60.00)
GLUCOSE: 146 mg/dL — AB (ref 70–99)
Potassium: 4.3 mEq/L (ref 3.5–5.1)
SODIUM: 139 meq/L (ref 135–145)

## 2014-03-26 LAB — HEMOGLOBIN A1C: Hgb A1c MFr Bld: 9.3 % — ABNORMAL HIGH (ref 4.6–6.5)

## 2014-03-26 NOTE — Assessment & Plan Note (Addendum)
Checking HgA1c and BMP at today's visit. The limited sugar log they brought today looks reassuring. He has not had hypoglycemia. Would have goal HgA1c <8%. HgA1c may not be ideal given his CKD. Feet without injury and no sores. He does have occasional tingling but not limiting his walking at this time. He is on statin. Not on ACE/ARB at this time and will find out from his nephrologist if there is contraindication. He takes humalog 10 units with meals and lantus 40 units at night time without hypoglycemia. He does have higher sugars with dietary indiscretion and encouraged dietary compliance.

## 2014-03-26 NOTE — Assessment & Plan Note (Signed)
Filed Vitals:   03/26/14 0952 03/26/14 1018  BP: 190/62 162/68  Pulse: 68   Temp: 97.4 F (36.3 C)   TempSrc: Oral   Resp: 18   Height:  (1.778 m)   Weight: 218 lb 12.8 oz (99.247 kg)   SpO2: 97%    BP mildly elevated and goal <140/<80 at this time. He has visit with nephrology later this month. If he does require additional agent ACE/ARB would be ideal for his diabetes. Checking BMP today.

## 2014-03-26 NOTE — Progress Notes (Signed)
   Subjective:    Patient ID: Casey Grimes, male    DOB: [birth year], 80 y.o.   MRN: [id number]  HPI The patient is an 80 YO man who is coming in to establish with this office. He has PMH of some diastolic dysfunction, CKD stage 4, HTN, DM type 2 (recently fairly well controlled). He has been doing fairly well at home. He has not had any problems with building up fluid or breathing problems. He denies chest pains. He is using a walker to ambulate and denies falls with this. He is dealing with some lower back pain that radiates to his legs for which gabapentin has helped significantly. Overall he feels that his health is reasonable. He continues to see a nephrologist about every 3 months to follow his kidney function. He has talked to them about dialysis and does not wish to pursue this even if his kidneys continue to decline. He does use humalog and lantus at home for his sugars. For the most part in the morning, his sugars are around 130 and stay within 160-200 during the day. The only time they go up is when he eats something he knows he is not supposed to eat which is not too frrequent. His wife helps to provide his history.   Review of Systems  Constitutional: Negative for fever, chills, activity change, appetite change and fatigue.  Respiratory: Negative for cough, chest tightness, shortness of breath and wheezing.   Cardiovascular: Negative for chest pain, palpitations and leg swelling.  Gastrointestinal: Negative for abdominal pain, diarrhea, constipation and abdominal distention.  Musculoskeletal: Positive for back pain and gait problem.  Skin: Positive for color change. Negative for wound.       Bilateral shins with color change.  Neurological: Negative for dizziness, syncope, weakness, light-headedness and headaches.       Objective:   Physical Exam  Nursing note and vitals reviewed. Constitutional: He is oriented to person, place, and time. He appears well-developed and  well-nourished.  Speech slow but content intact  HENT:  Head: Normocephalic and atraumatic.  Eyes: EOM are normal.  Neck: Normal range of motion. Neck supple. No JVD present.  Cardiovascular: Normal rate and regular rhythm.   No murmur heard. Pulmonary/Chest: Effort normal and breath sounds normal. No respiratory distress. He has no wheezes. He has no rales. He exhibits no tenderness.  Abdominal: Soft. Bowel sounds are normal. He exhibits no distension. There is no tenderness. There is no rebound.  Musculoskeletal: Normal range of motion. He exhibits tenderness. He exhibits no edema.  Some tenderness in the lowar lumbar region.   Neurological: He is alert and oriented to person, place, and time.  Patient walked with walker and no difficulty standing.   Skin: Skin is warm and dry.  Bilateral shins with color changes consistent with venous stasis, no significant edema in either leg.    Filed Vitals:   03/26/14 0952 03/26/14 1018  BP: 190/62 162/68  Pulse: 68   Temp: 97.4 F (36.3 C)   TempSrc: Oral   Resp: 18   Height:  (1.778 m)   Weight: 218 lb 12.8 oz (99.247 kg)   SpO2: 97%         Assessment & Plan:   Flu shot given at today's visit.

## 2014-03-26 NOTE — Assessment & Plan Note (Signed)
Last Creatinine 2.9 and rechecking today. Will forward results to nephrologist. He has talked to nephrologist and does not wish to have dialysis. Will continue this discussion and monitor kidney function.

## 2014-03-26 NOTE — Patient Instructions (Addendum)
We will check on your blood tests today and send them to your kidney doctor. We will refill your medicines when you need them. Come back in about 3-6 months. Please call us if you are feeling sick or have problems before then.   We have given you the flu shot today.   Keep working on not eating very sugary foods like ice cream, cakes, breads to help keep your sugars in good control. This will help keep your kidneys from getting worse.   Your blood pressure is a little high and we will have you see the kidney doctor. If the blood pressure is still high then we may have to make changes but for today we will keep things the same.   If you are gaining weight please call our office or the kidney doctor's office (if you gain about 2-3 pounds). This helps [place] to change your medicines before you get sick enough to be in the hospital.   Work on exercising how you are able about 3 times per week to keep your joints healthy and to strengthen your muscles. Back Exercises Back exercises help treat and prevent back injuries. The goal is to increase your strength in your belly (abdominal) and back muscles. These exercises can also help with flexibility. Start these exercises when told by your doctor. HOME CARE Back exercises include: Pelvic Tilt.  Lie on your back with your knees bent. Tilt your pelvis until the lower part of your back is against the floor. Hold this position 5 to 10 sec. Repeat this exercise 5 to 10 times. Knee to Chest.  Pull 1 knee up against your chest and hold for 20 to 30 seconds. Repeat this with the other knee. This may be done with the other leg straight or bent, whichever feels better. Then, pull both knees up against your chest. Sit-Ups or Curl-Ups.  Bend your knees 90 degrees. Start with tilting your pelvis, and do a partial, slow sit-up. Only lift your upper half 30 to 45 degrees off the floor. Take at least 2 to 3 seonds for each sit-up. Do not do sit-ups with your knees out  straight. If partial sit-ups are difficult, simply do the above but with only tightening your belly (abdominal) muscles and holding it as told. Hip-Lift.  Lie on your back with your knees flexed 90 degrees. Push down with your feet and shoulders as you raise your hips 2 inches off the floor. Hold for 10 seconds, repeat 5 to 10 times. Back Arches.  Lie on your stomach. Prop yourself up on bent elbows. Slowly press on your hands, causing an arch in your low back. Repeat 3 to 5 times. Shoulder-Lifts.  Lie face down with arms beside your body. Keep hips and belly pressed to floor as you slowly lift your head and shoulders off the floor. Do not overdo your exercises. Be careful in the beginning. Exercises may cause you some mild back discomfort. If the pain lasts for more than 15 minutes, stop the exercises until you see your doctor. Improvement with exercise for back problems is slow.  Document Released: 08/07/2010 Document Revised: 09/27/2011 Document Reviewed: 05/06/2011 [facility] Patient Information 2015 [place], [place]. This information is not intended to replace advice given to you by your health care provider. Make sure you discuss any questions you have with your health care provider.

## 2014-03-26 NOTE — Progress Notes (Signed)
Pre visit review using our clinic review tool, if applicable. No additional management support is needed unless otherwise documented below in the visit note. 

## 2014-03-26 NOTE — Assessment & Plan Note (Signed)
Flu shot given today

## 2014-04-01 ENCOUNTER — Other Ambulatory Visit: Payer: Self-pay | Admitting: Internal Medicine

## 2014-04-03 ENCOUNTER — Other Ambulatory Visit: Payer: Self-pay | Admitting: Internal Medicine

## 2014-04-09 ENCOUNTER — Other Ambulatory Visit: Payer: Self-pay | Admitting: Internal Medicine

## 2014-04-15 ENCOUNTER — Other Ambulatory Visit: Payer: Self-pay | Admitting: Geriatric Medicine

## 2014-04-15 MED ORDER — ATORVASTATIN CALCIUM 10 MG PO TABS: ORAL_TABLET | ORAL | Status: DC

## 2014-04-22 ENCOUNTER — Other Ambulatory Visit: Payer: Self-pay | Admitting: Geriatric Medicine

## 2014-04-22 MED ORDER — "INSULIN SYRINGE-NEEDLE U-100 30G X 1/2"" 0.5 ML MISC": Status: DC

## 2014-04-23 ENCOUNTER — Other Ambulatory Visit: Payer: Self-pay

## 2014-04-23 MED ORDER — DOXAZOSIN MESYLATE 4 MG PO TABS: ORAL_TABLET | ORAL | Status: DC

## 2014-05-28 ENCOUNTER — Other Ambulatory Visit: Payer: Self-pay | Admitting: Internal Medicine

## 2014-05-29 ENCOUNTER — Other Ambulatory Visit: Payer: Self-pay | Admitting: Geriatric Medicine

## 2014-05-29 MED ORDER — CARVEDILOL 25 MG PO TABS: 25.0000 mg | ORAL_TABLET | Freq: Two times a day (BID) | ORAL | Status: DC

## 2014-05-29 MED ORDER — GABAPENTIN 300 MG PO CAPS: ORAL_CAPSULE | ORAL | Status: DC

## 2014-05-29 MED ORDER — POTASSIUM CHLORIDE CRYS ER 20 MEQ PO TBCR: EXTENDED_RELEASE_TABLET | ORAL | Status: DC

## 2014-05-29 MED ORDER — "INSULIN SYRINGE-NEEDLE U-100 30G X 1/2"" 0.5 ML MISC": Status: DC

## 2014-05-29 MED ORDER — INSULIN GLARGINE 100 UNIT/ML ~~LOC~~ SOLN: 40.0000 [IU] | Freq: Every day | SUBCUTANEOUS | Status: DC

## 2014-05-29 MED ORDER — DOXAZOSIN MESYLATE 4 MG PO TABS: ORAL_TABLET | ORAL | Status: DC

## 2014-05-29 MED ORDER — AMLODIPINE BESYLATE 10 MG PO TABS: ORAL_TABLET | ORAL | Status: DC

## 2014-05-29 MED ORDER — ATORVASTATIN CALCIUM 10 MG PO TABS: ORAL_TABLET | ORAL | Status: DC

## 2014-05-29 MED ORDER — INSULIN LISPRO 100 UNIT/ML ~~LOC~~ SOLN: 10.0000 [IU] | SUBCUTANEOUS | Status: DC

## 2014-05-30 ENCOUNTER — Other Ambulatory Visit: Payer: Self-pay | Admitting: Internal Medicine

## 2014-06-03 ENCOUNTER — Other Ambulatory Visit: Payer: Self-pay | Admitting: Geriatric Medicine

## 2014-06-10 ENCOUNTER — Other Ambulatory Visit: Payer: Self-pay | Admitting: Internal Medicine

## 2014-06-30 ENCOUNTER — Other Ambulatory Visit: Payer: Self-pay | Admitting: Internal Medicine

## 2014-07-16 ENCOUNTER — Ambulatory Visit: Payer: Medicare Other | Admitting: Internal Medicine

## 2014-07-16 ENCOUNTER — Emergency Department (HOSPITAL_COMMUNITY): Payer: Medicare Other

## 2014-07-16 ENCOUNTER — Telehealth: Payer: Self-pay | Admitting: *Deleted

## 2014-07-16 ENCOUNTER — Encounter (HOSPITAL_COMMUNITY): Payer: Self-pay | Admitting: General Practice

## 2014-07-16 ENCOUNTER — Inpatient Hospital Stay (HOSPITAL_COMMUNITY)
Admission: EM | Admit: 2014-07-16 | Discharge: 2014-07-19 | DRG: 069 | Disposition: A | Discharge status: Skilled Nursing Facility | Payer: Medicare Other | Attending: Internal Medicine | Admitting: Internal Medicine

## 2014-07-16 DIAGNOSIS — Z7982 Long term (current) use of aspirin: Secondary | ICD-10-CM | POA: Diagnosis not present

## 2014-07-16 DIAGNOSIS — Z9842 Cataract extraction status, left eye: Secondary | ICD-10-CM

## 2014-07-16 DIAGNOSIS — E876 Hypokalemia: Secondary | ICD-10-CM | POA: Diagnosis present

## 2014-07-16 DIAGNOSIS — N184 Chronic kidney disease, stage 4 (severe): Secondary | ICD-10-CM | POA: Diagnosis present

## 2014-07-16 DIAGNOSIS — R471 Dysarthria and anarthria: Secondary | ICD-10-CM

## 2014-07-16 DIAGNOSIS — Z87891 Personal history of nicotine dependence: Secondary | ICD-10-CM

## 2014-07-16 DIAGNOSIS — Z79899 Other long term (current) drug therapy: Secondary | ICD-10-CM | POA: Diagnosis not present

## 2014-07-16 DIAGNOSIS — E1121 Type 2 diabetes mellitus with diabetic nephropathy: Secondary | ICD-10-CM | POA: Diagnosis present

## 2014-07-16 DIAGNOSIS — Z961 Presence of intraocular lens: Secondary | ICD-10-CM | POA: Diagnosis present

## 2014-07-16 DIAGNOSIS — Z9841 Cataract extraction status, right eye: Secondary | ICD-10-CM | POA: Diagnosis not present

## 2014-07-16 DIAGNOSIS — M545 Low back pain: Secondary | ICD-10-CM | POA: Diagnosis present

## 2014-07-16 DIAGNOSIS — R29810 Facial weakness: Secondary | ICD-10-CM

## 2014-07-16 DIAGNOSIS — Z794 Long term (current) use of insulin: Secondary | ICD-10-CM

## 2014-07-16 DIAGNOSIS — I639 Cerebral infarction, unspecified: Secondary | ICD-10-CM | POA: Diagnosis present

## 2014-07-16 DIAGNOSIS — R531 Weakness: Secondary | ICD-10-CM

## 2014-07-16 DIAGNOSIS — I129 Hypertensive chronic kidney disease with stage 1 through stage 4 chronic kidney disease, or unspecified chronic kidney disease: Secondary | ICD-10-CM | POA: Diagnosis present

## 2014-07-16 DIAGNOSIS — G459 Transient cerebral ischemic attack, unspecified: Principal | ICD-10-CM | POA: Diagnosis present

## 2014-07-16 DIAGNOSIS — I10 Essential (primary) hypertension: Secondary | ICD-10-CM | POA: Diagnosis present

## 2014-07-16 DIAGNOSIS — G9389 Other specified disorders of brain: Secondary | ICD-10-CM | POA: Diagnosis present

## 2014-07-16 DIAGNOSIS — E1142 Type 2 diabetes mellitus with diabetic polyneuropathy: Secondary | ICD-10-CM

## 2014-07-16 DIAGNOSIS — E785 Hyperlipidemia, unspecified: Secondary | ICD-10-CM | POA: Diagnosis present

## 2014-07-16 DIAGNOSIS — I5032 Chronic diastolic (congestive) heart failure: Secondary | ICD-10-CM | POA: Diagnosis present

## 2014-07-16 DIAGNOSIS — Z8673 Personal history of transient ischemic attack (TIA), and cerebral infarction without residual deficits: Secondary | ICD-10-CM | POA: Diagnosis not present

## 2014-07-16 DIAGNOSIS — R410 Disorientation, unspecified: Secondary | ICD-10-CM | POA: Diagnosis not present

## 2014-07-16 DIAGNOSIS — G629 Polyneuropathy, unspecified: Secondary | ICD-10-CM

## 2014-07-16 DIAGNOSIS — I739 Peripheral vascular disease, unspecified: Secondary | ICD-10-CM | POA: Diagnosis present

## 2014-07-16 DIAGNOSIS — E1342 Other specified diabetes mellitus with diabetic polyneuropathy: Secondary | ICD-10-CM

## 2014-07-16 DIAGNOSIS — R4182 Altered mental status, unspecified: Secondary | ICD-10-CM

## 2014-07-16 DIAGNOSIS — M109 Gout, unspecified: Secondary | ICD-10-CM | POA: Diagnosis present

## 2014-07-16 DIAGNOSIS — G319 Degenerative disease of nervous system, unspecified: Secondary | ICD-10-CM | POA: Diagnosis present

## 2014-07-16 LAB — CBC WITH DIFFERENTIAL/PLATELET
Basophils Absolute: 0 10*3/uL (ref 0.0–0.1)
Basophils Relative: 1 % (ref 0–1)
EOS ABS: 0.3 10*3/uL (ref 0.0–0.7)
Eosinophils Relative: 4 % (ref 0–5)
HCT: 41.6 % (ref 39.0–52.0)
HEMOGLOBIN: 13.8 g/dL (ref 13.0–17.0)
Lymphocytes Relative: 25 % (ref 12–46)
Lymphs Abs: 1.6 10*3/uL (ref 0.7–4.0)
MCH: 27.4 pg (ref 26.0–34.0)
MCHC: 33.2 g/dL (ref 30.0–36.0)
MCV: 82.5 fL (ref 78.0–100.0)
MONOS PCT: 8 % (ref 3–12)
Monocytes Absolute: 0.5 10*3/uL (ref 0.1–1.0)
Neutro Abs: 4 10*3/uL (ref 1.7–7.7)
Neutrophils Relative %: 62 % (ref 43–77)
Platelets: 266 10*3/uL (ref 150–400)
RBC: 5.04 MIL/uL (ref 4.22–5.81)
RDW: 15.4 % (ref 11.5–15.5)
WBC: 6.4 10*3/uL (ref 4.0–10.5)

## 2014-07-16 LAB — I-STAT TROPONIN, ED: Troponin i, poc: 0.05 ng/mL (ref 0.00–0.08)

## 2014-07-16 LAB — GLUCOSE, CAPILLARY
GLUCOSE-CAPILLARY: 245 mg/dL — AB (ref 70–99)
Glucose-Capillary: 192 mg/dL — ABNORMAL HIGH (ref 70–99)

## 2014-07-16 LAB — COMPREHENSIVE METABOLIC PANEL
ALK PHOS: 76 U/L (ref 39–117)
ALT: 19 U/L (ref 0–53)
AST: 22 U/L (ref 0–37)
Albumin: 3.6 g/dL (ref 3.5–5.2)
Anion gap: 10 (ref 5–15)
BUN: 46 mg/dL — ABNORMAL HIGH (ref 6–23)
CALCIUM: 9.6 mg/dL (ref 8.4–10.5)
CO2: 23 mmol/L (ref 19–32)
Chloride: 108 mEq/L (ref 96–112)
Creatinine, Ser: 2.78 mg/dL — ABNORMAL HIGH (ref 0.50–1.35)
GFR calc non Af Amer: 20 mL/min — ABNORMAL LOW (ref >90)
GFR, EST AFRICAN AMERICAN: 23 mL/min — AB (ref >90)
GLUCOSE: 201 mg/dL — AB (ref 70–99)
POTASSIUM: 3.5 mmol/L (ref 3.5–5.1)
Sodium: 141 mmol/L (ref 135–145)
TOTAL PROTEIN: 7.9 g/dL (ref 6.0–8.3)
Total Bilirubin: 0.9 mg/dL (ref 0.3–1.2)

## 2014-07-16 LAB — URINALYSIS, ROUTINE W REFLEX MICROSCOPIC
Bilirubin Urine: NEGATIVE
Glucose, UA: NEGATIVE mg/dL
Ketones, ur: NEGATIVE mg/dL
Leukocytes, UA: NEGATIVE
Nitrite: NEGATIVE
Protein, ur: >300 mg/dL — AB
Specific Gravity, Urine: 1.017 (ref 1.005–1.030)
Urobilinogen, UA: 0.2 mg/dL (ref 0.0–1.0)
pH: 5.5 (ref 5.0–8.0)

## 2014-07-16 LAB — URINE MICROSCOPIC-ADD ON

## 2014-07-16 LAB — I-STAT CG4 LACTIC ACID, ED: Lactic Acid, Venous: 1.46 mmol/L (ref 0.5–2.2)

## 2014-07-16 MED ORDER — AMLODIPINE BESYLATE 10 MG PO TABS
10.0000 mg | ORAL_TABLET | Freq: Every day | ORAL | Status: DC
  Administered 2014-07-16 – 2014-07-19 (×4): 10 mg via ORAL
  Filled 2014-07-16 (×4): qty 1

## 2014-07-16 MED ORDER — INSULIN ASPART 100 UNIT/ML ~~LOC~~ SOLN
0.0000 [IU] | Freq: Three times a day (TID) | SUBCUTANEOUS | Status: DC
Start: 2014-07-16 — End: 2014-07-19
  Administered 2014-07-16 – 2014-07-17 (×3): 3 [IU] via SUBCUTANEOUS
  Administered 2014-07-17: 7 [IU] via SUBCUTANEOUS
  Administered 2014-07-18: 1 [IU] via SUBCUTANEOUS
  Administered 2014-07-18: 2 [IU] via SUBCUTANEOUS
  Administered 2014-07-18 – 2014-07-19 (×2): 3 [IU] via SUBCUTANEOUS

## 2014-07-16 MED ORDER — INSULIN ASPART 100 UNIT/ML ~~LOC~~ SOLN
4.0000 [IU] | Freq: Three times a day (TID) | SUBCUTANEOUS | Status: DC
  Administered 2014-07-17 – 2014-07-19 (×5): 4 [IU] via SUBCUTANEOUS

## 2014-07-16 MED ORDER — CARVEDILOL 12.5 MG PO TABS
25.0000 mg | ORAL_TABLET | Freq: Two times a day (BID) | ORAL | Status: DC
  Administered 2014-07-16 – 2014-07-19 (×7): 25 mg via ORAL
  Filled 2014-07-16: qty 4
  Filled 2014-07-16 (×6): qty 2

## 2014-07-16 MED ORDER — ASPIRIN EC 81 MG PO TBEC: 81.0000 mg | DELAYED_RELEASE_TABLET | Freq: Every day | ORAL | Status: DC

## 2014-07-16 MED ORDER — INSULIN GLARGINE 100 UNIT/ML ~~LOC~~ SOLN
30.0000 [IU] | Freq: Every day | SUBCUTANEOUS | Status: DC
  Administered 2014-07-16 – 2014-07-18 (×3): 30 [IU] via SUBCUTANEOUS
  Filled 2014-07-16 (×5): qty 0.3

## 2014-07-16 MED ORDER — HYDRALAZINE HCL 20 MG/ML IJ SOLN
10.0000 mg | Freq: Four times a day (QID) | INTRAMUSCULAR | Status: DC | PRN
  Administered 2014-07-16 – 2014-07-17 (×3): 10 mg via INTRAVENOUS
  Filled 2014-07-16 (×3): qty 1

## 2014-07-16 MED ORDER — CLONIDINE HCL 0.1 MG PO TABS
0.3000 mg | ORAL_TABLET | Freq: Three times a day (TID) | ORAL | Status: DC
  Administered 2014-07-16 – 2014-07-19 (×10): 0.3 mg via ORAL
  Filled 2014-07-16 (×12): qty 3

## 2014-07-16 MED ORDER — LABETALOL HCL 5 MG/ML IV SOLN
10.0000 mg | Freq: Once | INTRAVENOUS | Status: AC
  Administered 2014-07-16: 10 mg via INTRAVENOUS
  Filled 2014-07-16: qty 4

## 2014-07-16 MED ORDER — SODIUM CHLORIDE 0.9 % IV SOLN
INTRAVENOUS | Status: AC
  Administered 2014-07-16: 16:00:00 via INTRAVENOUS

## 2014-07-16 MED ORDER — ATORVASTATIN CALCIUM 10 MG PO TABS
10.0000 mg | ORAL_TABLET | Freq: Every day | ORAL | Status: DC
  Administered 2014-07-16 – 2014-07-18 (×3): 10 mg via ORAL
  Filled 2014-07-16 (×3): qty 1

## 2014-07-16 MED ORDER — STROKE: EARLY STAGES OF RECOVERY BOOK
Freq: Once | Status: AC
  Administered 2014-07-16: 16:00:00
  Filled 2014-07-16: qty 1

## 2014-07-16 MED ORDER — HEPARIN SODIUM (PORCINE) 5000 UNIT/ML IJ SOLN
5000.0000 [IU] | Freq: Three times a day (TID) | INTRAMUSCULAR | Status: DC
  Administered 2014-07-16 – 2014-07-19 (×9): 5000 [IU] via SUBCUTANEOUS
  Filled 2014-07-16 (×9): qty 1

## 2014-07-16 MED ORDER — FUROSEMIDE 10 MG/ML IJ SOLN
80.0000 mg | Freq: Two times a day (BID) | INTRAMUSCULAR | Status: DC
  Administered 2014-07-16 – 2014-07-19 (×6): 80 mg via INTRAVENOUS
  Filled 2014-07-16 (×6): qty 8

## 2014-07-16 MED ORDER — ASPIRIN 325 MG PO TABS
325.0000 mg | ORAL_TABLET | Freq: Once | ORAL | Status: AC
  Administered 2014-07-16: 325 mg via ORAL
  Filled 2014-07-16: qty 1

## 2014-07-16 NOTE — Progress Notes (Signed)
PT Cancellation Note  Patient Details Name: Casey Grimes MRN: [id number] DOB: 08/17/1928   Cancelled Treatment:    Reason Eval/Treat Not Completed: Patient at procedure or test/unavailable Pt just arrived to the floor and RN and pharmacy doing admission assessment. Will follow up next available time.   Alvie HeidelbergFolan, Shivaun Bilello A 07/16/2014, 3:39 PM  Alvie HeidelbergShauna Folan, PT, DPT [phone]

## 2014-07-16 NOTE — Progress Notes (Signed)
Pt arrived to the unit.  Pt oriented and welcomed.  VS taken, BP elevated, MD paged, new orders received.  Telemetry applied and call bell within reach.  No c/o pain at this time.  Will continue to monitor. Sondra ComeSilva, Dayna Geurts M, RN

## 2014-07-16 NOTE — ED Notes (Signed)
[facility], per report pt started to feel weak, pts wife called EMS b/c the pt has not been out of bed since yesterday, pt denies slurred speech, CP, SOB, n/v/d, A&O x4, follows commands, speaks in complete sentences, pt has urinary incontinence, + R facial droop, & R arm weakness last night yesterday estimated at noon time

## 2014-07-16 NOTE — Telephone Encounter (Signed)
[facility] - Client TELEPHONE ADVICE RECORD [facility] Patient Name: Casey SickleLOUIS Casey Gender: Male DOB: 02/06/1929 Age: 80 Y 11 M 24 D Return Phone Number: [phone] (Primary) Address: [id number] [place]Zip: [place] [address] Client [facility] - Client Client Site  [facility] Physician Casey MechKollar, Grimes Contact Type Call Call Type Triage / Clinical Caller Name Casey Grimes Relationship To Patient Spouse Return Phone Number [phone] (Primary) Chief Complaint CONFUSION - new onset Initial Comment Caller states PT seems disoriented BS 413 PreDisposition Call Doctor Nurse Assessment Nurse: Casey PraderHeath, RN, Casey HerterShannon Date/Time Casey Grimes(Eastern Time): 07/15/2014 4:49:22 PM Confirm and document reason for call. If symptomatic, describe symptoms. ---Caller states her husband seems disoriented and his BS 413. Recheck of BS is 388. Has the patient traveled out of the country within the last 30 days? ---Not Applicable Does the patient require triage? ---Yes Related visit to physician within the last 2 weeks? ---No Does the PT have any chronic conditions? (i.e. diabetes, asthma, etc.) ---Yes List chronic conditions. ---DM, kidney disease, heart Guidelines Guideline Title Affirmed Question Affirmed Notes Nurse Date/Time (Eastern Time) Diabetes - High Blood Sugar [1] Blood glucose > 300 mg/dl (13.016.5 mmol/l) AND [8][2] two or more times in a row Casey PillingHeath, RN, Casey Grimes 07/15/2014 4:54:22 PM Disp. Time Casey Grimes(Eastern Time) Disposition Final User 07/15/2014 4:47:52 PM Send to Urgent Casey HutchingQueue Casey Grimes 07/15/2014 5:16:13 PM Called On-Call Provider Casey PraderHeath, RN, Casey HerterShannon Reason: Called Dr. Dorise HissKollar on her cell phone. 07/15/2014 5:19:47 PM Call Completed Casey PraderHeath, RN, Casey HerterShannon 07/15/2014 5:05:30 PM Call PCP Now Yes Casey PraderHeath, RN, Casey McardleShannon Caller Understands: Yes PLEASE NOTE: All timestamps contained within this report are  represented as [place] Standard Time. CONFIDENTIALTY NOTICE: This fax transmission is intended only for the addressee. It contains information that is legally privileged, confidential or otherwise protected from use or disclosure. If you are not the intended recipient, you are strictly prohibited from reviewing, disclosing, copying using or disseminating any of this information or taking any action in reliance on or regarding this information. If you have received this fax in error, please notify us immediately by telephone so that we can arrange for its return to us. Phone: [phone], Toll-Free: [phone], Fax: [phone] Page: 2 of 2 Call Id: [id number] Disagree/Comply: Comply Care Advice Given Per Guideline CALL PCP NOW: You need to discuss this with your doctor. I'll page him now. If you haven't heard from the on-call doctor within 30 minutes, call again. * Vomiting occurs CALL BACK IF: * You become worse. * Rapid breathing occurs CARE ADVICE given per Diabetes - High Blood Sugar (Adult) guideline. After Care Instructions Given Call Event Type User Date / Time Description Comments User: Casey OhShannon, Heath, RN Date/Time Casey Grimes(Eastern Time): 07/15/2014 5:04:58 PM He has had Humalog 10 units before breakfast, 15 before lunch. The next dose is due before dinner. Lantus 40 units at night. User: Casey OhShannon, Heath, RN Date/Time Casey Grimes(Eastern Time): 07/15/2014 5:19:34 PM I called caller back and she states that he normally takes 10 units at night, I advised that since it is only 10 units, then to give him 13 units and then have him eat dinner, if he won't eat, give us a call back. She states understanding. Referrals REFERRED TO PCP OFFICE
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
## 2014-07-16 NOTE — ED Notes (Signed)
Pt taken to CT to expedite scan d/t pts symptoms, RN accompanied pt to CT

## 2014-07-16 NOTE — ED Notes (Signed)
Neuro at the bedside.
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
## 2014-07-17 ENCOUNTER — Inpatient Hospital Stay (HOSPITAL_COMMUNITY): Payer: Medicare Other

## 2014-07-17 DIAGNOSIS — R410 Disorientation, unspecified: Secondary | ICD-10-CM

## 2014-07-17 DIAGNOSIS — R2689 Other abnormalities of gait and mobility: Secondary | ICD-10-CM

## 2014-07-17 DIAGNOSIS — I517 Cardiomegaly: Secondary | ICD-10-CM

## 2014-07-17 LAB — GLUCOSE, CAPILLARY
GLUCOSE-CAPILLARY: 218 mg/dL — AB (ref 70–99)
GLUCOSE-CAPILLARY: 301 mg/dL — AB (ref 70–99)
Glucose-Capillary: 201 mg/dL — ABNORMAL HIGH (ref 70–99)
Glucose-Capillary: 282 mg/dL — ABNORMAL HIGH (ref 70–99)

## 2014-07-17 LAB — LIPID PANEL
CHOLESTEROL: 127 mg/dL (ref 0–200)
HDL: 34 mg/dL — ABNORMAL LOW (ref >39)
LDL Cholesterol: 66 mg/dL (ref 0–99)
Total CHOL/HDL Ratio: 3.7 RATIO
Triglycerides: 135 mg/dL (ref <150)
VLDL: 27 mg/dL (ref 0–40)

## 2014-07-17 LAB — BASIC METABOLIC PANEL
ANION GAP: 10 (ref 5–15)
BUN: 51 mg/dL — ABNORMAL HIGH (ref 6–23)
CO2: 22 mmol/L (ref 19–32)
Calcium: 9.5 mg/dL (ref 8.4–10.5)
Chloride: 109 mEq/L (ref 96–112)
Creatinine, Ser: 2.94 mg/dL — ABNORMAL HIGH (ref 0.50–1.35)
GFR calc Af Amer: 21 mL/min — ABNORMAL LOW (ref >90)
GFR calc non Af Amer: 18 mL/min — ABNORMAL LOW (ref >90)
Glucose, Bld: 198 mg/dL — ABNORMAL HIGH (ref 70–99)
POTASSIUM: 3.2 mmol/L — AB (ref 3.5–5.1)
SODIUM: 141 mmol/L (ref 135–145)

## 2014-07-17 LAB — URINE CULTURE
COLONY COUNT: NO GROWTH
CULTURE: NO GROWTH

## 2014-07-17 LAB — CBC
HCT: 41 % (ref 39.0–52.0)
Hemoglobin: 13.8 g/dL (ref 13.0–17.0)
MCH: 28.5 pg (ref 26.0–34.0)
MCHC: 33.7 g/dL (ref 30.0–36.0)
MCV: 84.5 fL (ref 78.0–100.0)
PLATELETS: 282 10*3/uL (ref 150–400)
RBC: 4.85 MIL/uL (ref 4.22–5.81)
RDW: 15.7 % — ABNORMAL HIGH (ref 11.5–15.5)
WBC: 8.4 10*3/uL (ref 4.0–10.5)

## 2014-07-17 LAB — TSH: TSH: 2.383 u[IU]/mL (ref 0.350–4.500)

## 2014-07-17 LAB — HEMOGLOBIN A1C
HEMOGLOBIN A1C: 9.5 % — AB (ref <5.7)
Mean Plasma Glucose: 226 mg/dL — ABNORMAL HIGH (ref <117)

## 2014-07-17 MED ORDER — ASPIRIN EC 325 MG PO TBEC
325.0000 mg | DELAYED_RELEASE_TABLET | Freq: Every day | ORAL | Status: DC
  Administered 2014-07-17 – 2014-07-19 (×3): 325 mg via ORAL
  Filled 2014-07-17 (×3): qty 1

## 2014-07-17 MED ORDER — POTASSIUM CHLORIDE CRYS ER 20 MEQ PO TBCR
40.0000 meq | EXTENDED_RELEASE_TABLET | Freq: Once | ORAL | Status: AC
  Administered 2014-07-17: 40 meq via ORAL
  Filled 2014-07-17: qty 2

## 2014-07-17 NOTE — Progress Notes (Addendum)
Upon shift assessment, patient could not verbalize name, birthdate, or say anything for 5 minutes. Patient systolic has gone from 125 to 147160 (160/112). Patient is able to talk but speech sounds more slurred and he is confused. Will notify provider on call at this time.   Orders received from [facility], will continue to monitor at this time.

## 2014-07-17 NOTE — Progress Notes (Signed)
NEURO HOSPITALIST PROGRESS NOTE   SUBJECTIVE:                                                                                                                        Resting comfortably in bed. He can not have a normal conversation and is disoriented to year-month-situation. MRI brain showed no acute abnormality but " moderate to severe ventriculomegaly, likely on the basis of global parenchymal brain volume loss as there is overall commensurate enlargement of cerebral sulci and cerebellar folia. Remote bilateral basal ganglia, LEFT thalamus lacunar infarcts.Remote small RIGHT cerebellar and LEFT pontine infarcts. Moderate tosevere white matter changes suggest chronic small vessel ischemic disease". MRA HEAD: Moderately motion degraded examination without large vessel occlusion. Lipid panel, CUS and TTE pending.  OBJECTIVE:                                                                                                                           Vital signs in last 24 hours: Temp:  [97.8 F (36.6 C)-98.8 F (37.1 C)] 98.8 F (37.1 C) (12/29 2135) Pulse Rate:  [75-94] 75 (12/29 2135) Resp:  [16-27] 20 (12/30 0500) BP: (149-237)/(69-159) 170/69 mmHg (12/30 0618) SpO2:  [98 %-100 %] 99 % (12/29 2135) Weight:  [92.761 kg (204 lb 8 oz)] 92.761 kg (204 lb 8 oz) (12/29 1524)  Intake/Output from previous day: 12/29 0701 - 12/30 0700 In: -  Out: 125 [Urine:125] Intake/Output this shift: Total I/O In: 600 [P.O.:600] Out: -  Nutritional status: Diet heart healthy/carb modified  Past Medical History  Diagnosis Date  . Gout   . Hypertension   . Impotence   . Former smoker     quit 1988  . Depression   . Hyperlipidemia   . Osteoarthritis   . Chronic kidney disease (CKD), stage IV (severe)     Hattie Perch 07/23/2013  . Type II diabetes mellitus   . Diabetic peripheral neuropathy     Hattie Perch 07/23/2013  . Respiratory failure with hypoxia 07/23/2013    Hattie Perch  07/23/2013  . Orthopnoea   . CVA (cerebral vascular accident) 1980's    denies residual on 07/23/2013  . Chronic lower back pain   Physical exam: pleasant male in no apparent distress. Blood pressure  216/159, pulse 81, temperature 98.1 F (36.7 C), temperature source Oral, resp. rate 19, SpO2 100 %. Head: normocephalic. Neck: supple, no bruits, no JVD. Cardiac: no murmurs. Lungs: clear. Abdomen: soft, no tender, no mass. Extremities: no edema. Skin: no rash   Neurologic Exam:  Mental Status: Alert, oriented only to person. Speech fluent without evidence of aphasia. Able to follow 3 step commands without difficulty. Cranial Nerves: II: Discs flat bilaterally; Visual fields grossly normal, pupils equal, round, reactive to light and accommodation III,IV, VI: ptosis not present, extra-ocular motions intact bilaterally V,VII: ??smile asymmetric due to right face weakness (patient has no dentures), facial light touch sensation normal bilaterally VIII: hearing normal bilaterally IX,X: gag reflex present XI: bilateral shoulder shrug XII: midline tongue extension without atrophy or fasciculations Motor: Moves all limbs spontaneously and symmetrically Tone and bulk:normal tone throughout; no atrophy noted Sensory: Pinprick and light touch intact throughout, bilaterally Deep Tendon Reflexes:  Right: Upper Extremity Left: Upper extremity   biceps (C-5 to C-6) 2/4 biceps (C-5 to C-6) 2/4 tricep (C7) 2/4triceps (C7) 2/4 Brachioradialis (C6) 2/4Brachioradialis (C6) 2/4  Lower Extremity Lower Extremity  quadriceps (L-2 to L-4) 2/4 quadriceps (L-2 to L-4) 2/4 Achilles (S1) 2/4Achilles (S1) 2/4 Plantars: Right: upgoingLeft: downgoing Cerebellar: normal finger-to-nose, normal heel-to-shin test Gait:  Unable to  test at this time  Lab Results: Lab Results  Component Value Date/Time   CHOL 96 06/19/2012 11:10 AM   Lipid Panel No results for input(s): CHOL, TRIG, HDL, CHOLHDL, VLDL, LDLCALC in the last 72 hours.  Studies/Results: Dg Chest 2 View  07/16/2014   CLINICAL DATA:  Weakness and right-sided facial droop  EXAM: CHEST  2 VIEW  COMPARISON:  July 23, 2013  FINDINGS: There is no edema or consolidation. There is a tiny granuloma in the right upper lobe. Heart size and pulmonary vascularity are within normal limits. No adenopathy. Prominence along the paratracheal regions is probably due to great vessel prominence in this age group. No bony lesions.  IMPRESSION: Tiny granuloma right upper lobe.  No edema or consolidation.   Electronically Signed   By: Bretta BangWilliam  Woodruff M.D.   On: 07/16/2014 13:53   Ct Head Wo Contrast  07/16/2014   CLINICAL DATA:  Right-sided facial droop.  EXAM: CT HEAD WITHOUT CONTRAST  TECHNIQUE: Contiguous axial images were obtained from the base of the skull through the vertex without intravenous contrast.  COMPARISON:  None.  FINDINGS: Bony calvarium appears intact. Moderate diffuse cortical atrophy is noted. Mild chronic ischemic white matter disease is noted. No mass effect or midline shift is noted. Ventricular size is within normal limits. There is no evidence of mass lesion, hemorrhage or acute infarction.  IMPRESSION: Moderate diffuse cortical atrophy. Mild chronic ischemic white matter disease. No acute intracranial abnormality seen.   Electronically Signed   By: Roque LiasJames  Green M.D.   On: 07/16/2014 13:47   Mr Brain Wo Contrast  07/17/2014   CLINICAL DATA:  Weakness and RIGHT facial droop, confusion.  EXAM: MRI HEAD WITHOUT CONTRAST  MRA HEAD WITHOUT CONTRAST  TECHNIQUE: Multiplanar, multiecho pulse sequences of the brain and surrounding structures were obtained without intravenous contrast. Angiographic images of the head were obtained using MRA technique without contrast.   COMPARISON:  CT of the head July 16, 2014  FINDINGS: MRI HEAD FINDINGS  No reduced diffusion to suggest acute ischemia. Severely motion degraded axial gradient, limits sensitivity for blood products. The remainder of the examination is mild to moderately motion degraded. Moderate to severe ventriculomegaly,  likely on the basis of global parenchymal brain volume loss as there is overall commensurate enlargement of cerebral sulci and cerebellar folia. Remote RIGHT superior cerebellar small infarct. Remote LEFT pontine remote infarct. Bilateral basal ganglia, LEFT thalamus remote lacunar infarcts. Confluent white matter changes suggest chronic small vessel ischemic disease.  No abnormal extra-axial fluid collections. Status post bilateral ocular lens implants. No paranasal sinus air-fluid levels. No abnormal sellar expansion. No cerebellar tonsillar ectopia. No suspicious calvarial bone marrow signal.  MRA HEAD FINDINGS  Moderately motion degraded examination. Flow related enhancement within the cervical, petrous, cavernous and supra clinoid internal carotid arteries, with somewhat bulbous appearance of the RIGHT cavernous carotid artery which could be artifact. Flow related enhancement within the anterior and middle cerebral arteries without large vessel occlusion. RIGHT A1 segment is dominant. Limited assessment for luminal irregularity and aneurysm.  RIGHT vertebral artery is dominant. LEFT vertebral artery appears to terminate in the posterior inferior cerebellar artery. Flow related enhancement within the basilar artery, limited assessment due to motion. Robust bilateral posterior communicating arteries provide the prominent vascular supply to the posterior cerebral arteries. No large vessel occlusion. Limited assessment for luminal irregularity and aneurysm due to motion.  IMPRESSION: MRI HEAD:  Motion degraded examination.  No acute ischemia.  Remote bilateral basal ganglia, LEFT thalamus lacunar infarcts.  Remote small RIGHT cerebellar and LEFT pontine infarcts. Moderate to severe white matter changes suggest chronic small vessel ischemic disease.  MRA HEAD: Moderately motion degraded examination without large vessel occlusion.  Somewhat bulbous appearance of the RIGHT cavernous carotid artery, equivocal for aneurysm, considering patient's difficulty tolerating MRI, CT angiogram of the head could be performed as clinically indicated.   Electronically Signed   By: Awilda Metroourtnay  Bloomer   On: 07/17/2014 04:28   Mr Maxine GlennMra Head/brain Wo Cm  07/17/2014   CLINICAL DATA:  Weakness and RIGHT facial droop, confusion.  EXAM: MRI HEAD WITHOUT CONTRAST  MRA HEAD WITHOUT CONTRAST  TECHNIQUE: Multiplanar, multiecho pulse sequences of the brain and surrounding structures were obtained without intravenous contrast. Angiographic images of the head were obtained using MRA technique without contrast.  COMPARISON:  CT of the head July 16, 2014  FINDINGS: MRI HEAD FINDINGS  No reduced diffusion to suggest acute ischemia. Severely motion degraded axial gradient, limits sensitivity for blood products. The remainder of the examination is mild to moderately motion degraded. Moderate to severe ventriculomegaly, likely on the basis of global parenchymal brain volume loss as there is overall commensurate enlargement of cerebral sulci and cerebellar folia. Remote RIGHT superior cerebellar small infarct. Remote LEFT pontine remote infarct. Bilateral basal ganglia, LEFT thalamus remote lacunar infarcts. Confluent white matter changes suggest chronic small vessel ischemic disease.  No abnormal extra-axial fluid collections. Status post bilateral ocular lens implants. No paranasal sinus air-fluid levels. No abnormal sellar expansion. No cerebellar tonsillar ectopia. No suspicious calvarial bone marrow signal.  MRA HEAD FINDINGS  Moderately motion degraded examination. Flow related enhancement within the cervical, petrous, cavernous and supra clinoid  internal carotid arteries, with somewhat bulbous appearance of the RIGHT cavernous carotid artery which could be artifact. Flow related enhancement within the anterior and middle cerebral arteries without large vessel occlusion. RIGHT A1 segment is dominant. Limited assessment for luminal irregularity and aneurysm.  RIGHT vertebral artery is dominant. LEFT vertebral artery appears to terminate in the posterior inferior cerebellar artery. Flow related enhancement within the basilar artery, limited assessment due to motion. Robust bilateral posterior communicating arteries provide the prominent vascular supply to the posterior cerebral arteries.  No large vessel occlusion. Limited assessment for luminal irregularity and aneurysm due to motion.  IMPRESSION: MRI HEAD:  Motion degraded examination.  No acute ischemia.  Remote bilateral basal ganglia, LEFT thalamus lacunar infarcts. Remote small RIGHT cerebellar and LEFT pontine infarcts. Moderate to severe white matter changes suggest chronic small vessel ischemic disease.  MRA HEAD: Moderately motion degraded examination without large vessel occlusion.  Somewhat bulbous appearance of the RIGHT cavernous carotid artery, equivocal for aneurysm, considering patient's difficulty tolerating MRI, CT angiogram of the head could be performed as clinically indicated.   Electronically Signed   By: Awilda Metro   On: 07/17/2014 04:28    MEDICATIONS                                                                                                                        Scheduled: . amLODipine  10 mg Oral Daily  . aspirin EC  325 mg Oral Daily  . atorvastatin  10 mg Oral q1800  . carvedilol  25 mg Oral BID WC  . cloNIDine  0.3 mg Oral TID  . furosemide  80 mg Intravenous BID  . heparin  5,000 Units Subcutaneous 3 times per day  . insulin aspart  0-9 Units Subcutaneous TID WC  . insulin aspart  4 Units Subcutaneous TID WC  . insulin glargine  30 Units Subcutaneous QHS     ASSESSMENT/PLAN:                                                                                                           80 y.o. male brought in due to inability to walk since yesterday and report of right arm-face weakness. MRI brain negative for acute abnormality but multifocal subcortical infarcts, severe small vessel disease, and ventriculomegaly in the context of global cerebral atrophy. Although TIA a possibility, I am also wondering about the possibility of an underlying dementia manifesting with gait apraxia, as he has not frank weakness lower extremities. Needs PT and completion of TIA work up.  Wyatt Portela, MD [facility] [phone]  07/17/2014, 9:07 AM

## 2014-07-17 NOTE — Progress Notes (Signed)
CARE MANAGEMENT NOTE 07/17/2014  Patient:  Casey Grimes,Casey Grimes   Account Number:  [id number]  Date Initiated:  07/17/2014  Documentation initiated by:  Jiles CrockerHANDLER,Katara Griner  Subjective/Objective Assessment:   ADMITTED FOR STROKE WORKUP     Action/Plan:   CM FOLLOWING FOR DCP   Anticipated DC Date:  07/20/2014   Anticipated DC Plan:  FOR POSSIBLE SNF PLACEMENT     DC Planning Services  CM consult        Status of service:  In process, will continue to follow Medicare Important Message given?   (If response is "NO", the following Medicare IM given date fields will be blank)  Per UR Regulation:  Reviewed for med. necessity/level of care/duration of stay  Comments:  12/30/2015Abelino Derrick- B Ipek Westra RN,BSN,MHA [phone]

## 2014-07-17 NOTE — Evaluation (Signed)
Occupational Therapy Evaluation Patient Details Name: Casey Grimes MRN: [id number] DOB: 08-21-1928 Today's Date: 07/17/2014    History of Present Illness Patient is a 80 y/o male with PMH of HTN, DM 2, ESRD and chronic lumbar back pain came into the hospital because of weakness. Hx obtained from ED -Apparently patient went to bed okay last night, when he woke up this morning he couldn't get off of the bed. EMS was called. Questionable right facial droop noted. In the ED he was found to have blood pressure of 218/98. MRI brain- no acute abnormality but " moderate to severe ventriculomegaly, likely on the basis of global parenchymal brain volume loss; Remote bilateral basal ganglia, LEFT thalamus lacunar infarcts.Remote small RIGHT cerebellar and left pontine infarcts.   Clinical Impression   Pt demonstrates decline in function with ADLs and ADL mobility safety with decreased strength, balance, endurance and cognitive deficits. Pt has difficulty following simple commands and requires increased time to complete tasks. Pt a poor historian and no family member present for accurate PLOF. Pt would benefit from acute OT services to address impairments to increase level of function and safety    Follow Up Recommendations  SNF;Supervision/Assistance - 24 hour    Equipment Recommendations  Other (comment) (TBD)    Recommendations for Other Services       Precautions / Restrictions Precautions Precautions: Fall Restrictions Weight Bearing Restrictions: No      Mobility Bed Mobility Overal bed mobility: Needs Assistance Bed Mobility: Supine to Sit     Supine to sit: HOB elevated;Supervision     General bed mobility comments: used rails  Transfers Overall transfer level: Needs assistance Equipment used: 1 person hand held assist;Rolling walker (2 wheeled) Transfers: Sit to/from Stand Sit to Stand: Min assist         General transfer comment: Min A to rise. Manual cues for hand  placement. Stood from Landscape architect.     Balance Overall balance assessment: Needs assistance Sitting-balance support: Feet supported;No upper extremity supported Sitting balance-Leahy Scale: Fair     Standing balance support: Single extremity supported;Bilateral upper extremity supported;No upper extremity supported;During functional activity Standing balance-Leahy Scale: Poor Standing balance comment: requires UE support during static and dynamic standing due to sway and balance deficits.                            ADL Overall ADL's : Needs assistance/impaired     Grooming: Wash/dry hands;Wash/dry face;Minimal assistance;Standing;Cueing for sequencing;Cueing for safety   Upper Body Bathing: Minimal assitance;Sitting;Cueing for safety;Cueing for sequencing   Lower Body Bathing: Moderate assistance;Cueing for sequencing;Cueing for safety;Sitting/lateral leans   Upper Body Dressing : Sitting;Minimal assistance;Cueing for sequencing;Cueing for safety   Lower Body Dressing: Moderate assistance;Sitting/lateral leans;Sit to/from stand;Cueing for safety;Cueing for sequencing   Toilet Transfer: Minimal assistance;RW;Regular Toilet;Grab bars;Ambulation;Cueing for safety;Cueing for sequencing;+2 for safety/equipment           Functional mobility during ADLs: Minimal assistance;+2 for safety/equipment;Cueing for sequencing;Cueing for safety       Vision Eye Alignment: Impaired (comment)   Ocular Range of Motion: Impaired-to be further tested in functional context Tracking/Visual Pursuits: Impaired - to be further tested in functional context                        Pertinent Vitals/Pain Pain Assessment: No/denies pain Faces Pain Scale: No hurt     Hand Dominance Right   Extremity/Trunk Assessment Upper  Extremity Assessment Upper Extremity Assessment: Generalized weakness;Overall [facility] for tasks assessed   Lower Extremity Assessment Lower Extremity Assessment:  Generalized weakness       Communication Communication Communication: Expressive difficulties   Cognition Arousal/Alertness: Awake/alert Behavior During Therapy: Flat affect Overall Cognitive Status: No family/caregiver present to determine baseline cognitive functioning Area of Impairment: Orientation;Attention;Following commands;Safety/judgement;Problem solving Orientation Level: Disoriented to;Place;Time;Situation Current Attention Level: Alternating   Following Commands: Follows one step commands inconsistently;Follows one step commands with increased time Safety/Judgement: Decreased awareness of safety;Decreased awareness of deficits   Problem Solving: Slow processing;Decreased initiation;Difficulty sequencing;Requires verbal cues;Requires tactile cues     General Comments   pt pleasant and cooperative                 Home Living Family/patient expects to be discharged to:: Private residence Living Arrangements: Spouse/significant other;Children Available Help at Discharge: Family;Available 24 hours/day Type of Home: House Home Access: Stairs to enter [employer] of Steps: 4   Home Layout: One level     Bathroom Shower/Tub: Tub/shower unit;Walk-in shower   Bathroom Toilet: Standard     Home Equipment:  (Nods yes to using RW however told OT earlier does not use RW.)   Additional Comments: Pt states that he does not use an AD at home for mobility, however previous hx notes a single point cane. Inconsistencies noted in reported PLOF/history from prior notes and from what was told to OT eariler in AM.      Prior Functioning/Environment          Comments: Pt is poor historian and no family members present to provide PLOF/history. Above social history from prior admission. Not sure of accuracy.     OT Diagnosis: Generalized weakness;Cognitive deficits   OT Problem List: Decreased strength;Decreased knowledge of use of DME or AE;Decreased  coordination;Decreased activity tolerance;Decreased cognition;Impaired balance (sitting and/or standing);Decreased safety awareness   OT Treatment/Interventions: Self-care/ADL training;Therapeutic exercise;Patient/family education;Neuromuscular education;Balance training;Therapeutic activities    OT Goals(Current goals can be found in the care plan section) Acute Rehab OT Goals Patient Stated Goal: none stated OT Goal Formulation: With patient Time For Goal Achievement: 07/24/14 Potential to Achieve Goals: Good ADL Goals Pt Will Perform Grooming: with min guard assist;with supervision;with set-up;standing Pt Will Perform Upper Body Bathing: with min guard assist;with supervision;with set-up;sitting;standing Pt Will Perform Lower Body Bathing: with min assist;sitting/lateral leans;sit to/from stand Pt Will Perform Upper Body Dressing: with min guard assist;with supervision;with set-up;sitting;standing Pt Will Perform Lower Body Dressing: with min assist;sitting/lateral leans;sit to/from stand Pt Will Transfer to Toilet: with min guard assist;ambulating;grab bars Pt Will Perform Toileting - Clothing Manipulation and hygiene: with min assist;with min guard assist;sit to/from stand  OT Frequency: Min 2X/week   Barriers to D/C: Decreased caregiver support                        End of Session Equipment Utilized During Treatment: Gait belt;Rolling walker  Activity Tolerance: Patient tolerated treatment well Patient left: in chair;with call bell/phone within reach;with nursing/sitter in room   Time: 0927-0954 OT Time Calculation (min): 27 min Charges:  OT General Charges $OT Visit: 1 Procedure OT Evaluation $Initial OT Evaluation Tier I: 1 Procedure OT Treatments $Therapeutic Activity: 8-22 mins G-Codes:    Galen Manila 07/17/2014, 12:52 PM

## 2014-07-17 NOTE — Progress Notes (Signed)
Inpatient Diabetes Program Recommendations  AACE/ADA: New Consensus Statement on Inpatient Glycemic Control (2013)  Target Ranges:  Prepandial:   less than 140 mg/dL      Peak postprandial:   less than 180 mg/dL (1-2 hours)      Critically ill patients:  140 - 180 mg/dL  Results for Romilda JoyMCGIRT, Florence J (MRN [id number]) as of 07/17/2014 13:36  Ref. Range 07/16/2014 17:55 07/16/2014 21:40 07/17/2014 06:22 07/17/2014 11:44  Glucose-Capillary Latest Range: 70-99 mg/dL 782245 (H) 956192 (H) 213218 (H) 301 (H)   Inpatient Diabetes Program Recommendations Insulin - Basal: increase Lantus to home dose 40 units  Thank you  Piedad ClimesGina Mouhamadou Gittleman BSN, RN,CDE Inpatient Diabetes Coordinator [phone] (team pager)
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
## 2014-07-17 NOTE — Progress Notes (Signed)
*  PRELIMINARY RESULTS* Vascular Ultrasound Carotid Duplex (Doppler) has been completed.  Preliminary findings: Bilateral:  1-39% ICA stenosis.  Moderate irregular heterogenous plaque noted. Vertebral artery flow is antegrade. Left: Findings consistent with greater than 50% ECA stenosis.      [name] 07/17/2014, 2:37 PM

## 2014-07-17 NOTE — Progress Notes (Signed)
Spoke with [name], patient's wife.  She has stated that prior to Monday patient was alert and oriented x4, he was able to completely care for himself.  His current behaviors are all new and is not his baseline.  Will continue to monitor patient.

## 2014-07-17 NOTE — Progress Notes (Signed)
Chaplain responded to spiritual care consult for advanced directive. Chaplain gave pt Advanced Directive packet and explained its contents. Chaplain asked pt to have his nurse page the chaplain if he has any additional questions.    07/17/14 1100  Clinical Encounter Type  Visited With Patient;Health care provider  Visit Type Initial;Spiritual support  Referral From Nurse  Stress Factors  Patient Stress Factors None identified  Advance Directives (For Healthcare)  Does patient have an advance directive? No  Would patient like information on creating an advanced directive? Yes - Educational materials given  Jiles HaroldStamey, Nila Winker F, Chaplain 07/17/2014 11:54 AM

## 2014-07-17 NOTE — Evaluation (Addendum)
Physical Therapy Evaluation Patient Details Name: Casey Grimes MRN: [id number] DOB: February 12, 1929 Today's Date: 07/17/2014   History of Present Illness  Patient is a 80 y/o male with PMH of HTN, DM 2, ESRD and chronic lumbar back pain came into the hospital because of weakness. Hx obtained from ED -Apparently patient went to bed okay last night, when he woke up this morning he couldn't get off of the bed. EMS was called. Questionable right facial droop noted. In the ED he was found to have blood pressure of 218/98. MRI brain- no acute abnormality but " moderate to severe ventriculomegaly, likely on the basis of global parenchymal brain volume loss; Remote bilateral basal ganglia, LEFT thalamus lacunar infarcts.Remote small RIGHT cerebellar and left pontine infarcts.    Clinical Impression  Patient presents with functional limitation due to deficits listed in PT problem list (see below). Pt with generalized weakness, cognitive deficits - impaired safety awareness/judgment/insight, and balance deficits impacting safe mobility. Requires Min A (hands on assist) for safety during gait and for redirection. High falls risk. If family able to provide 24/7 hands on assist at home, pt may be safe to return home otherwise pt would benefit from [facility] to improve overall mobility and safety to decrease fall risk and ease burden of care.    Follow Up Recommendations SNF;Supervision/Assistance - 24 hour (vs hands on assist at home.)    Equipment Recommendations  None recommended by PT    Recommendations for Other Services       Precautions / Restrictions Precautions Precautions: Fall Restrictions Weight Bearing Restrictions: No      Mobility  Bed Mobility               General bed mobility comments: Received sitting in chair upon PT arrival.  Sit to supine: Min guard assist with cues for repositioning. HOB flat, no rails. Transfers Overall transfer level: Needs assistance Equipment used: 1  person hand held assist Transfers: Sit to/from Stand Sit to Stand: Min assist         General transfer comment: Min A to rise. Manual cues for hand placement. Stood from Landscape architect.   Ambulation/Gait Ambulation/Gait assistance: Min assist Ambulation Distance (Feet): 75 Feet (x2 bouts) Assistive device: 1 person hand held assist Gait Pattern/deviations: Step-through pattern;Decreased stride length;Drifts right/left;Staggering left;Staggering right   Gait velocity interpretation: Below normal speed for age/gender General Gait Details: Pt with slow, unsteady gait. Easily distracted scanning environment requiring Min A for balance and redirection. Grabbed chair to sit in during gait in hallway without warning.   Stairs            Wheelchair Mobility    Modified Rankin (Stroke Patients Only)       Balance Overall balance assessment: Needs assistance Sitting-balance support: Feet supported;No upper extremity supported Sitting balance-Leahy Scale: Fair     Standing balance support: During functional activity;Single extremity supported Standing balance-Leahy Scale: Poor Standing balance comment: requires UE support during static and dynamic standing due to sway and balance deficits.                             Pertinent Vitals/Pain Pain Assessment: Faces Faces Pain Scale: No hurt    Home Living Family/patient expects to be discharged to:: Private residence Living Arrangements: Spouse/significant other;Children Available Help at Discharge: Family;Available 24 hours/day Type of Home: House Home Access: Stairs to enter   [employer] of Steps: 4 Home Layout: One level  Home Equipment:  (Nods yes to using RW however told OT earlier does not use RW.) Additional Comments: Pt states that he does not use an AD at home for mobility, however previous hx notes a single point cane. Inconsistencies noted in reported PLOF/history from prior notes and from what  was told to OT eariler in AM.    Prior Function           Comments: Pt is poor historian and no family members present to provide PLOF/history. Above social history from prior admission. Not sure of accuracy.      Hand Dominance   Dominant Hand: Right    Extremity/Trunk Assessment   Upper Extremity Assessment: Defer to OT evaluation           Lower Extremity Assessment: Generalized weakness         Communication   Communication: Expressive difficulties  Cognition Arousal/Alertness: Awake/alert Behavior During Therapy: Flat affect Overall Cognitive Status: No family/caregiver present to determine baseline cognitive functioning Area of Impairment: Orientation;Attention;Following commands;Safety/judgement;Problem solving Orientation Level: Disoriented to;Place;Time;Situation Current Attention Level: Alternating   Following Commands: Follows one step commands inconsistently;Follows one step commands with increased time Safety/Judgement: Decreased awareness of safety;Decreased awareness of deficits   Problem Solving: Slow processing;Decreased initiation;Difficulty sequencing;Requires verbal cues;Requires tactile cues      General Comments      Exercises        Assessment/Plan    PT Assessment Patient needs continued PT services  PT Diagnosis Difficulty walking;Generalized weakness   PT Problem List Decreased strength;Decreased cognition;Decreased activity tolerance;Decreased balance;Decreased safety awareness;Decreased mobility  PT Treatment Interventions Balance training;Gait training;DME instruction;Neuromuscular re-education;Patient/family education;Functional mobility training;Therapeutic activities;Therapeutic exercise   PT Goals (Current goals can be found in the Care Plan section) Acute Rehab PT Goals PT Goal Formulation: Patient unable to participate in goal setting Time For Goal Achievement: 07/31/14 Potential to Achieve Goals: Fair    Frequency  Min 3X/week   Barriers to discharge Other (comment) Not sure of available support at home.     Co-evaluation               End of Session Equipment Utilized During Treatment: Gait belt Activity Tolerance: Patient limited by fatigue Patient left: in chair;with call bell/phone within reach;with chair alarm set;with nursing/sitter in room Nurse Communication: Mobility status         Time: 1011-1027 PT Time Calculation (min) (ACUTE ONLY): 16 min   Charges:   PT Evaluation $Initial PT Evaluation Tier I: 1 Procedure PT Treatments $Gait Training: 8-22 mins   PT G CodesAlvie Heidelberg A 2014/07/23, 11:45 AM  Alvie Heidelberg, PT, DPT [phone]

## 2014-07-17 NOTE — Progress Notes (Signed)
  Echocardiogram 2D Echocardiogram has been performed.  Casey Grimes, Casey Grimes 07/17/2014, 11:02 AM

## 2014-07-17 NOTE — Progress Notes (Signed)
[facility]                                                                              Patient Demographics  Casey Grimes, is a 80 y.o. male, DOB - 12/01/1928, KGM:[id number]  Admit date - 07/16/2014   Admitting Physician Clydia Llano, MD  Outpatient Primary MD for the patient is Judie Bonus, MD  LOS - 1   Chief Complaint  Patient presents with  . Weakness      HPI on 07/16/2014 by Dr. Clydia Llano Casey Grimes is a 80 y.o. male with past medical history of HTN, DM 2, ESRD and chronic lumbar back pain came into the hospital because of weakness. Patient is awake but confused about why he is in the hospital Saying he came in because he is having pain. No family members at bedside. The H&P obtained from ED and neurology notes. Apparently patient went to bed okay last night, when he woke up this morning he couldn't get off of the bed. EMS was called and brought him to the hospital for further evaluation. Questionable right facial droop noted. In the ED he was found to have blood pressure of 218/98, please note that his blood pressure from last PCP note was 190/62, creatinine 2.7. Chest x-ray and CT scan of the head both showed no abnormalities.  Assessment & Plan   Weakness/ambulatory dysfunction -MRI of the brain: Negative for acute abnormality however subcortical infarcts with small vessel disease -Neurology consultation appreciated -Possibly secondary to TIA however may also have underlying dementia with gait apraxia -PT and OT consulted, PT recommended SNF -Echocardiogram, carotid Doppler pending -Will obtain TSH, vitamin B-12, folate levels  Hypertension -Initially allowed for permissive hypertension however MRI negative for stroke -Continue amlodipine, clonidine, Coreg, Lasix  Chronic kidney disease, stage IV -Creatinine currently stable, continue to monitor BMP  Hypokalemia -Will replace and continue to monitor -Likely secondary to his Lasix  Diabetes  mellitus -Hemoglobin A1c 9.5 -Continue insulin sliding-scale CBG monitoring -Upon discharge, patient will need a follow-up with his primary care physician for tighter control  Hyperlipidemia -Lipid panel: Total cholesterol 127, triglycerides 135, HDL 34, LDL 66 -Continue statin  Code Status: Full  Family Communication: None at bedside, attempted to contact wife  Disposition Plan: Admitted  Time Spent in minutes   30 minutes  Procedures  Echocardiogram and carotid Doppler pending  Consults   Neurology  DVT Prophylaxis  heparin  Lab Results  Component Value Date   PLT 282 07/17/2014    Medications  Scheduled Meds: . amLODipine  10 mg Oral Daily  . aspirin EC  325 mg Oral Daily  . atorvastatin  10 mg Oral q1800  . carvedilol  25 mg Oral BID WC  . cloNIDine  0.3 mg Oral TID  . furosemide  80 mg Intravenous BID  . heparin  5,000 Units Subcutaneous 3 times per day  . insulin aspart  0-9 Units Subcutaneous TID WC  . insulin aspart  4 Units Subcutaneous TID WC  . insulin glargine  30 Units Subcutaneous QHS   Continuous Infusions:  PRN Meds:.hydrALAZINE  Antibiotics    Anti-infectives    None  Subjective:   Casey Grimes seen and examined today.  Patient unable to answer questions.  Sitter at bedside.  Patient has no complaints.    Objective:   Filed Vitals:   07/17/14 0532 07/17/14 0618 07/17/14 0916 07/17/14 0918  BP: 179/80 170/69 195/73 185/69  Pulse:      Temp:   99.1 F (37.3 C)   TempSrc:   Oral   Resp:   18   Height:      Weight:      SpO2:        Wt Readings from Last 3 Encounters:  07/16/14 92.761 kg (204 lb 8 oz)  03/26/14 99.247 kg (218 lb 12.8 oz)  07/31/13 112.946 kg (249 lb)     Intake/Output Summary (Last 24 hours) at 07/17/14 1245 Last data filed at 07/17/14 5176  Gross per 24 hour  Intake    600 ml  Output    325 ml  Net    275 ml    Exam  General: Well developed, well nourished, NAD, appears stated  age  HEENT: NCAT, mucous membranes moist.   Cardiovascular: S1 S2 auscultated, Regular rate and rhythm.  Respiratory: Clear to auscultation bilaterally with equal chest rise  Abdomen: Soft, nontender, nondistended, + bowel sounds  Extremities: warm dry without cyanosis clubbing or edema  Neuro: Awake and alert, not oriented.  Can follow directions.   Data Review   Micro Results No results found for this or any previous visit (from the past 240 hour(s)).  Radiology Reports Dg Chest 2 View  07/16/2014   CLINICAL DATA:  Weakness and right-sided facial droop  EXAM: CHEST  2 VIEW  COMPARISON:  July 23, 2013  FINDINGS: There is no edema or consolidation. There is a tiny granuloma in the right upper lobe. Heart size and pulmonary vascularity are within normal limits. No adenopathy. Prominence along the paratracheal regions is probably due to great vessel prominence in this age group. No bony lesions.  IMPRESSION: Tiny granuloma right upper lobe.  No edema or consolidation.   Electronically Signed   By: Bretta Bang M.D.   On: 07/16/2014 13:53   Ct Head Wo Contrast  07/16/2014   CLINICAL DATA:  Right-sided facial droop.  EXAM: CT HEAD WITHOUT CONTRAST  TECHNIQUE: Contiguous axial images were obtained from the base of the skull through the vertex without intravenous contrast.  COMPARISON:  None.  FINDINGS: Bony calvarium appears intact. Moderate diffuse cortical atrophy is noted. Mild chronic ischemic white matter disease is noted. No mass effect or midline shift is noted. Ventricular size is within normal limits. There is no evidence of mass lesion, hemorrhage or acute infarction.  IMPRESSION: Moderate diffuse cortical atrophy. Mild chronic ischemic white matter disease. No acute intracranial abnormality seen.   Electronically Signed   By: Roque Lias M.D.   On: 07/16/2014 13:47   Mr Brain Wo Contrast  07/17/2014   CLINICAL DATA:  Weakness and RIGHT facial droop, confusion.  EXAM: MRI  HEAD WITHOUT CONTRAST  MRA HEAD WITHOUT CONTRAST  TECHNIQUE: Multiplanar, multiecho pulse sequences of the brain and surrounding structures were obtained without intravenous contrast. Angiographic images of the head were obtained using MRA technique without contrast.  COMPARISON:  CT of the head July 16, 2014  FINDINGS: MRI HEAD FINDINGS  No reduced diffusion to suggest acute ischemia. Severely motion degraded axial gradient, limits sensitivity for blood products. The remainder of the examination is mild to moderately motion degraded. Moderate to severe ventriculomegaly, likely on the  basis of global parenchymal brain volume loss as there is overall commensurate enlargement of cerebral sulci and cerebellar folia. Remote RIGHT superior cerebellar small infarct. Remote LEFT pontine remote infarct. Bilateral basal ganglia, LEFT thalamus remote lacunar infarcts. Confluent white matter changes suggest chronic small vessel ischemic disease.  No abnormal extra-axial fluid collections. Status post bilateral ocular lens implants. No paranasal sinus air-fluid levels. No abnormal sellar expansion. No cerebellar tonsillar ectopia. No suspicious calvarial bone marrow signal.  MRA HEAD FINDINGS  Moderately motion degraded examination. Flow related enhancement within the cervical, petrous, cavernous and supra clinoid internal carotid arteries, with somewhat bulbous appearance of the RIGHT cavernous carotid artery which could be artifact. Flow related enhancement within the anterior and middle cerebral arteries without large vessel occlusion. RIGHT A1 segment is dominant. Limited assessment for luminal irregularity and aneurysm.  RIGHT vertebral artery is dominant. LEFT vertebral artery appears to terminate in the posterior inferior cerebellar artery. Flow related enhancement within the basilar artery, limited assessment due to motion. Robust bilateral posterior communicating arteries provide the prominent vascular supply to  the posterior cerebral arteries. No large vessel occlusion. Limited assessment for luminal irregularity and aneurysm due to motion.  IMPRESSION: MRI HEAD:  Motion degraded examination.  No acute ischemia.  Remote bilateral basal ganglia, LEFT thalamus lacunar infarcts. Remote small RIGHT cerebellar and LEFT pontine infarcts. Moderate to severe white matter changes suggest chronic small vessel ischemic disease.  MRA HEAD: Moderately motion degraded examination without large vessel occlusion.  Somewhat bulbous appearance of the RIGHT cavernous carotid artery, equivocal for aneurysm, considering patient's difficulty tolerating MRI, CT angiogram of the head could be performed as clinically indicated.   Electronically Signed   By: Awilda Metro   On: 07/17/2014 04:28   Mr Maxine Glenn Head/brain Wo Cm  07/17/2014   CLINICAL DATA:  Weakness and RIGHT facial droop, confusion.  EXAM: MRI HEAD WITHOUT CONTRAST  MRA HEAD WITHOUT CONTRAST  TECHNIQUE: Multiplanar, multiecho pulse sequences of the brain and surrounding structures were obtained without intravenous contrast. Angiographic images of the head were obtained using MRA technique without contrast.  COMPARISON:  CT of the head July 16, 2014  FINDINGS: MRI HEAD FINDINGS  No reduced diffusion to suggest acute ischemia. Severely motion degraded axial gradient, limits sensitivity for blood products. The remainder of the examination is mild to moderately motion degraded. Moderate to severe ventriculomegaly, likely on the basis of global parenchymal brain volume loss as there is overall commensurate enlargement of cerebral sulci and cerebellar folia. Remote RIGHT superior cerebellar small infarct. Remote LEFT pontine remote infarct. Bilateral basal ganglia, LEFT thalamus remote lacunar infarcts. Confluent white matter changes suggest chronic small vessel ischemic disease.  No abnormal extra-axial fluid collections. Status post bilateral ocular lens implants. No paranasal  sinus air-fluid levels. No abnormal sellar expansion. No cerebellar tonsillar ectopia. No suspicious calvarial bone marrow signal.  MRA HEAD FINDINGS  Moderately motion degraded examination. Flow related enhancement within the cervical, petrous, cavernous and supra clinoid internal carotid arteries, with somewhat bulbous appearance of the RIGHT cavernous carotid artery which could be artifact. Flow related enhancement within the anterior and middle cerebral arteries without large vessel occlusion. RIGHT A1 segment is dominant. Limited assessment for luminal irregularity and aneurysm.  RIGHT vertebral artery is dominant. LEFT vertebral artery appears to terminate in the posterior inferior cerebellar artery. Flow related enhancement within the basilar artery, limited assessment due to motion. Robust bilateral posterior communicating arteries provide the prominent vascular supply to the posterior cerebral arteries. No large vessel  occlusion. Limited assessment for luminal irregularity and aneurysm due to motion.  IMPRESSION: MRI HEAD:  Motion degraded examination.  No acute ischemia.  Remote bilateral basal ganglia, LEFT thalamus lacunar infarcts. Remote small RIGHT cerebellar and LEFT pontine infarcts. Moderate to severe white matter changes suggest chronic small vessel ischemic disease.  MRA HEAD: Moderately motion degraded examination without large vessel occlusion.  Somewhat bulbous appearance of the RIGHT cavernous carotid artery, equivocal for aneurysm, considering patient's difficulty tolerating MRI, CT angiogram of the head could be performed as clinically indicated.   Electronically Signed   By: Awilda Metro   On: 07/17/2014 04:28    CBC  Recent Labs Lab 07/16/14 1310 07/17/14 0737  WBC 6.4 8.4  HGB 13.8 13.8  HCT 41.6 41.0  PLT 266 282  MCV 82.5 84.5  MCH 27.4 28.5  MCHC 33.2 33.7  RDW 15.4 15.7*  LYMPHSABS 1.6  --   MONOABS 0.5  --   EOSABS 0.3  --   BASOSABS 0.0  --      Chemistries   Recent Labs Lab 07/16/14 1310 07/17/14 0737  NA 141 141  K 3.5 3.2*  CL 108 109  CO2 23 22  GLUCOSE 201* 198*  BUN 46* 51*  CREATININE 2.78* 2.94*  CALCIUM 9.6 9.5  AST 22  --   ALT 19  --   ALKPHOS 76  --   BILITOT 0.9  --    ------------------------------------------------------------------------------------------------------------------ estimated creatinine clearance is 22.2 mL/min (by C-G formula based on Cr of 2.94). ------------------------------------------------------------------------------------------------------------------  Recent Labs  07/16/14 1310  HGBA1C 9.5*   ------------------------------------------------------------------------------------------------------------------  Recent Labs  07/17/14 0737  CHOL 127  HDL 34*  LDLCALC 66  TRIG 213  CHOLHDL 3.7   ------------------------------------------------------------------------------------------------------------------ No results for input(s): TSH, T4TOTAL, T3FREE, THYROIDAB in the last 72 hours.  Invalid input(s): FREET3 ------------------------------------------------------------------------------------------------------------------ No results for input(s): VITAMINB12, FOLATE, FERRITIN, TIBC, IRON, RETICCTPCT in the last 72 hours.  Coagulation profile No results for input(s): INR, PROTIME in the last 168 hours.  No results for input(s): DDIMER in the last 72 hours.  Cardiac Enzymes No results for input(s): CKMB, TROPONINI, MYOGLOBIN in the last 168 hours.  Invalid input(s): CK ------------------------------------------------------------------------------------------------------------------ Invalid input(s): POCBNP    Casey Grimes D.O. on 07/17/2014 at 12:45 PM  Between 7am to 7pm - Pager - [phone]  After 7pm go to [email or web address] - password [email or web address]  And look for the night coverage person covering for me after hours  [facility] Office   [phone]

## 2014-07-18 ENCOUNTER — Inpatient Hospital Stay (HOSPITAL_COMMUNITY): Payer: Medicare Other

## 2014-07-18 LAB — BASIC METABOLIC PANEL
ANION GAP: 14 (ref 5–15)
BUN: 67 mg/dL — AB (ref 6–23)
CO2: 17 mmol/L — AB (ref 19–32)
CREATININE: 3.31 mg/dL — AB (ref 0.50–1.35)
Calcium: 8.9 mg/dL (ref 8.4–10.5)
Chloride: 110 mEq/L (ref 96–112)
GFR calc Af Amer: 18 mL/min — ABNORMAL LOW (ref >90)
GFR calc non Af Amer: 16 mL/min — ABNORMAL LOW (ref >90)
GLUCOSE: 211 mg/dL — AB (ref 70–99)
Potassium: 3.6 mmol/L (ref 3.5–5.1)
Sodium: 141 mmol/L (ref 135–145)

## 2014-07-18 LAB — VITAMIN B12: VITAMIN B 12: 432 pg/mL (ref 211–911)

## 2014-07-18 LAB — GLUCOSE, CAPILLARY
GLUCOSE-CAPILLARY: 170 mg/dL — AB (ref 70–99)
GLUCOSE-CAPILLARY: 220 mg/dL — AB (ref 70–99)
Glucose-Capillary: 206 mg/dL — ABNORMAL HIGH (ref 70–99)
Glucose-Capillary: 135 mg/dL — ABNORMAL HIGH (ref 70–99)

## 2014-07-18 LAB — FOLATE RBC: RBC Folate: 719 ng/mL — ABNORMAL HIGH (ref >280)

## 2014-07-18 MED ORDER — LEVETIRACETAM 250 MG PO TABS
250.0000 mg | ORAL_TABLET | Freq: Two times a day (BID) | ORAL | Status: DC
  Administered 2014-07-18 – 2014-07-19 (×3): 250 mg via ORAL
  Filled 2014-07-18 (×3): qty 1

## 2014-07-18 MED ORDER — POTASSIUM CHLORIDE CRYS ER 20 MEQ PO TBCR
40.0000 meq | EXTENDED_RELEASE_TABLET | Freq: Once | ORAL | Status: AC
  Administered 2014-07-18: 40 meq via ORAL
  Filled 2014-07-18: qty 2

## 2014-07-18 NOTE — Clinical Social Work Placement (Signed)
Clinical Social Work Department CLINICAL SOCIAL WORK PLACEMENT NOTE 07/18/2014  Patient:  Casey Grimes,Casey Grimes  Account Number:  [id number] Admit date:  07/16/2014  Clinical Social Worker:  Gwynne EdingerYSHEKA Letesha Klecker, LCSWA  Date/time:  07/18/2014 09:06 AM  Clinical Social Work is seeking post-discharge placement for this patient at the following level of care:   SKILLED NURSING   (*CSW will update this form in Epic as items are completed)   07/18/2014  [facility]family provided with [facility] of Clinical Social Work's list of facilities offering this level of care within the geographic area requested by the patient (or if unable, by the patient's family).  07/18/2014  Patient/family informed of their freedom to choose among providers that offer the needed level of care, that participate in Medicare, Medicaid or managed care program needed by the patient, have an available bed and are willing to accept the patient.  07/18/2014  Patient/family informed of [facility]' ownership interest in [facility], as well as of the fact that they are under no obligation to receive care at this facility.  PASARR submitted to EDS on  PASARR number received on   FL2 transmitted to all facilities in geographic area requested by pt/family on  07/18/2014 FL2 transmitted to all facilities within larger geographic area on 07/18/2014  Patient informed that his/her managed care company has contracts with or will negotiate with  certain facilities, including the following:     Patient/family informed of bed offers received:  07/18/2014 [facility] Physician recommends and patient chooses bed at    Patient to be transferred to  on   Patient to be transferred to facility by  Patient and family notified of transfer on  Name of family member notified:    The following physician request were entered in Epic:   Additional Comments: Existing PASSAR   Taylan Marez, MSW, LCSWA [phone]

## 2014-07-18 NOTE — Progress Notes (Signed)
EEG Completed; Results Pending  

## 2014-07-18 NOTE — Procedures (Signed)
ELECTROENCEPHALOGRAM REPORT   Patient: Casey Grimes       Room #: 1O104N28 EEG No. ID: [id number] Age: 80 y.o.        Sex: male Referring Physician: Catha GosselinMikhail Report Date:  07/18/2014        Interpreting Physician: Thana FarrEYNOLDS, Anavey Coombes  History: Casey JoyLouis J Laroche is an 80 y.o. male presenting with weakness and confusion  Medications:  Scheduled: . amLODipine  10 mg Oral Daily  . aspirin EC  325 mg Oral Daily  . atorvastatin  10 mg Oral q1800  . carvedilol  25 mg Oral BID WC  . cloNIDine  0.3 mg Oral TID  . furosemide  80 mg Intravenous BID  . heparin  5,000 Units Subcutaneous 3 times per day  . insulin aspart  0-9 Units Subcutaneous TID WC  . insulin aspart  4 Units Subcutaneous TID WC  . insulin glargine  30 Units Subcutaneous QHS    Conditions of Recording:  This is a 16 channel EEG carried out with the patient in the awake and drowsy states.  Description:  The waking background activity consists of a low voltage, symmetrical, fairly well organized, 6-7 Hz alpha activity, seen from the parieto-occipital and posterior temporal regions.  Low voltage fast activity, poorly organized, is seen anteriorly and is at times superimposed on more posterior regions.  A mixture of theta and alpha rhythms are seen from the central and temporal regions.  Theta activity is most prominent with alpha activity being only noted rarely.   The patient appears to drowses throughout most of the recording with theta activity remaining prominent and some delta activity noted as well.   There is no evidence of Stage II sleep. No epileptiform activity is noted.   Hyperventilation was not performed.  Intermittent photic stimulation was performed but failed to illicit any change in the tracing.     IMPRESSION: This EEG is characterized by slowing which is consistent with normal drowse.  Can not rule out the possibility of slowing related to general cerebral disturbance such as a metabolic encephalopathy.  Clinical  correlation recommended.  No epileptiform activity is noted.       Thana FarrLeslie Desmen Schoffstall, MD [facility] [phone] 07/18/2014, 12:37 PM

## 2014-07-18 NOTE — Progress Notes (Signed)
Physical Therapy Treatment Patient Details Name: Casey Grimes MRN: [id number] DOB: [birth year] Today's Date: 07/18/2014    History of Present Illness Patient is a 80 y/o male with PMH of HTN, DM 2, ESRD and chronic lumbar back pain came into the hospital because of weakness. Hx obtained from ED -Apparently patient went to bed okay last night, when he woke up this morning he couldn't get off of the bed. EMS was called. Questionable right facial droop noted. In the ED he was found to have blood pressure of 218/98. MRI brain- no acute abnormality but " moderate to severe ventriculomegaly, likely on the basis of global parenchymal brain volume loss; Remote bilateral basal ganglia, LEFT thalamus lacunar infarcts.Remote small RIGHT cerebellar and left pontine infarcts.    PT Comments    Patient not progressing with mobility today due to lack of participation. Pt declined ambulation but agreed to sit up in chair for lunch with much encouragement and coaxing. Delayed response to questions. Demonstrated good dynamic sitting balance. Continue to recommend 24/7 supervision/assist for all mobility due to cognitive and balance deficits. if family not able to provide this assist, recommend ST SNF. Will continue to follow acutely.   Follow Up Recommendations  SNF;Supervision/Assistance - 24 hour     Equipment Recommendations  None recommended by PT    Recommendations for Other Services       Precautions / Restrictions Precautions Precautions: Fall Restrictions Weight Bearing Restrictions: No    Mobility  Bed Mobility Overal bed mobility: Needs Assistance Bed Mobility: Supine to Sit     Supine to sit: HOB elevated;Supervision     General bed mobility comments: Use of rails. Supervision and cues for safety.   Transfers Overall transfer level: Needs assistance Equipment used: None Transfers: Sit to/from Stand Sit to Stand: Min guard         General transfer comment: Min guard to stand  from EOB.   Ambulation/Gait Ambulation/Gait assistance: Min assist Ambulation Distance (Feet): 5 Feet Assistive device: None Gait Pattern/deviations: Step-through pattern;Decreased stride length;Trunk flexed   Gait velocity interpretation: Below normal speed for age/gender General Gait Details: Pt ambulated a few feet to the chair with much encouragement. Unsteady on feet reaching for arm rests prior to sitting. Declined further ambulation.   Stairs            Wheelchair Mobility    Modified Rankin (Stroke Patients Only)       Balance Overall balance assessment: Needs assistance Sitting-balance support: Feet supported;No upper extremity supported Sitting balance-Leahy Scale: Good Sitting balance - Comments: Able to reach outside BoS and donn socks sitting EOB without LOB. Dyspnea present.      Standing balance-Leahy Scale: Fair Standing balance comment: Tolerated static standing without UE support however reaching for furniture during dynamic standing.                    Cognition Arousal/Alertness: Awake/alert Behavior During Therapy: Flat affect Overall Cognitive Status: No family/caregiver present to determine baseline cognitive functioning Area of Impairment: Problem solving;Safety/judgement;Following commands       Following Commands: Follows one step commands with increased time;Follows one step commands inconsistently Safety/Judgement: Decreased awareness of safety;Decreased awareness of deficits   Problem Solving: Slow processing;Decreased initiation;Requires verbal cues;Requires tactile cues General Comments: Pt with little verbalizations today. Increased cues to follow commands.    Exercises      General Comments General comments (skin integrity, edema, etc.): Per notes, pt with episode of inability to talk or move  his right side with fixed gaze for approximately 5 minutes overnight. CT of the head was ordered and was negative for acute  findings.      Pertinent Vitals/Pain Pain Assessment: Faces Faces Pain Scale: No hurt    Home Living                      Prior Function            PT Goals (current goals can now be found in the care plan section) Progress towards PT goals: Not progressing toward goals - comment (due to lack of participation)    Frequency  Min 3X/week    PT Plan Current plan remains appropriate    Co-evaluation             End of Session Equipment Utilized During Treatment: Gait belt Activity Tolerance: Patient limited by fatigue;Other (comment) (Not wanting to participate.) Patient left: in chair;with call bell/phone within reach;with chair alarm set     Time: 3086-5784 PT Time Calculation (min) (ACUTE ONLY): 10 min  Charges:  $Therapeutic Activity: 8-22 mins                    G CodesAlvie Heidelberg A August 14, 2014, 11:49 AM Alvie Heidelberg, PT, DPT [phone]

## 2014-07-18 NOTE — Clinical Social Work Psychosocial (Signed)
Clinical Social Work Department BRIEF PSYCHOSOCIAL ASSESSMENT 07/18/2014  Patient:  Casey Grimes,Casey Grimes     Account Number:  [id number]     Admit date:  07/16/2014  Clinical Social Worker:  Bo McclintockBIBBS,Kean Gautreau, LCSWA  Date/Time:  07/18/2014 08:40 AM  Referred by:  RN  Date Referred:  07/18/2014 Referred for  SNF Placement   Other Referral:   Interview type:  Family Other interview type:   Pt oriented to self only; Wife Casey Grimes,Casey Grimes    PSYCHOSOCIAL DATA Living Status:  WIFE Admitted from facility:   Level of care:   Primary support name:  Casey Grimes,Casey Grimes Primary support relationship to patient:  SPOUSE Degree of support available:   Fair Support    CURRENT CONCERNS Current Concerns  None Noted   Other Concerns:    SOCIAL WORK ASSESSMENT / PLAN CSW spoke with the pt's wife [name]. CSW introduced self and purpose of visit. CSW and the [name] discussed the clinical team recommendations for rehab. CSW and the pt discussed the geographic area in which the pt is interest in receiving rehab. The pt expressed interested in [facility]. CSW provided the pt with contact information for further questions. CSW will continue to follow this pt and assist with discharge as needed.   Assessment/plan status:  Psychosocial Support/Ongoing Assessment of Needs Other assessment/ plan:   Information/referral to community resources:    PATIENT'S/FAMILY'S RESPONSE TO PLAN OF CARE: Pt's wife was pleasant and receptive. Pt wife agrees with the pt's plan of care.   Beckie Viscardi, MSW, LCSWA [phone]

## 2014-07-18 NOTE — Progress Notes (Signed)
Subjective: No further complaints.  Over night had a brief staring spell with no TC activity.  Concern for possible seizure.   Objective: Current vital signs: BP 157/68 mmHg  Pulse 69  Temp(Src) 98.6 F (37 C) (Oral)  Resp 18  Ht 5\' 11"  (1.803 m)  Wt 92.761 kg (204 lb 8 oz)  BMI 28.53 kg/m2  SpO2 99% Vital signs in last 24 hours: Temp:  [97.5 F (36.4 C)-98.8 F (37.1 C)] 98.6 F (37 C) (12/31 0300) Pulse Rate:  [59-74] 69 (12/31 0300) Resp:  [16-20] 18 (12/31 0400) BP: (125-190)/(58-112) 157/68 mmHg (12/31 0630) SpO2:  [99 %] 99 % (12/31 0300)  Intake/Output from previous day: 12/30 0701 - 12/31 0700 In: 1200 [P.O.:1200] Out: 750 [Urine:750] Intake/Output this shift: Total I/O In: 120 [P.O.:120] Out: 350 [Urine:350] Nutritional status: Diet heart healthy/carb modified  Neurologic Exam: Mental Status: Alert, oriented only to person. Speech fluent without evidence of aphasia. Able to follow 3 step commands without difficulty. Cranial Nerves: II: Discs flat bilaterally; Visual fields grossly normal, pupils equal, round, reactive to light and accommodation III,IV, VI: ptosis not present, extra-ocular motions intact bilaterally V,VII: ??smile asymmetric due to right face weakness (patient has no dentures), facial light touch sensation normal bilaterally VIII: hearing normal bilaterally IX,X: gag reflex present XI: bilateral shoulder shrug XII: midline tongue extension without atrophy or fasciculations Motor: Moves all limbs spontaneously and symmetrically Tone and bulk:normal tone throughout; no atrophy noted Sensory: Pinprick and light touch intact throughout, bilaterally Deep Tendon Reflexes:  Right: Upper Extremity Left: Upper extremity   biceps (C-5 to C-6) 2/4 biceps (C-5 to C-6) 2/4 tricep (C7) 2/4triceps (C7) 2/4 Brachioradialis (C6) 2/4Brachioradialis  (C6) 2/4  Lower Extremity Lower Extremity  quadriceps (L-2 to L-4) 2/4 quadriceps (L-2 to L-4) 2/4 Achilles (S1) 2/4Achilles (S1) 2/4 Plantars: Right: upgoingLeft: downgoing Cerebellar: normal finger-to-nose, normal heel-to-shin test Gait:  Unable to test at this time  Lab Results: Basic Metabolic Panel:  Recent Labs Lab 07/16/14 1310 07/17/14 0737 07/18/14 0546  NA 141 141 141  K 3.5 3.2* 3.6  CL 108 109 110  CO2 23 22 17*  GLUCOSE 201* 198* 211*  BUN 46* 51* 67*  CREATININE 2.78* 2.94* 3.31*  CALCIUM 9.6 9.5 8.9    Liver Function Tests:  Recent Labs Lab 07/16/14 1310  AST 22  ALT 19  ALKPHOS 76  BILITOT 0.9  PROT 7.9  ALBUMIN 3.6   No results for input(s): LIPASE, AMYLASE in the last 168 hours. No results for input(s): AMMONIA in the last 168 hours.  CBC:  Recent Labs Lab 07/16/14 1310 07/17/14 0737  WBC 6.4 8.4  NEUTROABS 4.0  --   HGB 13.8 13.8  HCT 41.6 41.0  MCV 82.5 84.5  PLT 266 282    Cardiac Enzymes: No results for input(s): CKTOTAL, CKMB, CKMBINDEX, TROPONINI in the last 168 hours.  Lipid Panel:  Recent Labs Lab 07/17/14 0737  CHOL 127  TRIG 135  HDL 34*  CHOLHDL 3.7  VLDL 27  LDLCALC 66    CBG:  Recent Labs Lab 07/17/14 1144 07/17/14 1709 07/17/14 2217 07/18/14 0631 07/18/14 1115  GLUCAP 301* 201* 282* 206* 170*    Microbiology: Results for orders placed or performed during the hospital encounter of 07/16/14  Urine culture     Status: None   Collection Time: 07/16/14  6:50 PM  Result Value Ref Range Status   Specimen Description URINE, CLEAN CATCH  Final   Special Requests NONE  Final  Colony Count NO GROWTH Performed at [facility]   Final   Culture NO GROWTH Performed at [facility]   Final   Report Status 07/17/2014 FINAL  Final    Coagulation Studies: No results for input(s): LABPROT, INR in the last 72  hours.  Imaging: Dg Chest 2 View  07/16/2014   CLINICAL DATA:  Weakness and right-sided facial droop  EXAM: CHEST  2 VIEW  COMPARISON:  July 23, 2013  FINDINGS: There is no edema or consolidation. There is a tiny granuloma in the right upper lobe. Heart size and pulmonary vascularity are within normal limits. No adenopathy. Prominence along the paratracheal regions is probably due to great vessel prominence in this age group. No bony lesions.  IMPRESSION: Tiny granuloma right upper lobe.  No edema or consolidation.   Electronically Signed   By: Bretta Bang M.D.   On: 07/16/2014 13:53   Ct Head Wo Contrast  07/17/2014   CLINICAL DATA:  Altered mental state.  Transient ischemic attack.  EXAM: CT HEAD WITHOUT CONTRAST  TECHNIQUE: Contiguous axial images were obtained from the base of the skull through the vertex without intravenous contrast.  COMPARISON:  MRI of earlier today and CT of 1 day prior.  FINDINGS: Sinuses/Soft tissues: Motion degradation. Surgical changes about the globes. Cerumen in the right external ear canal. Clear paranasal sinuses and mastoid air cells.  Intracranial: Advanced cerebral atrophy. Mild low density in the periventricular white matter likely related to small vessel disease. No mass lesion, hemorrhage, hydrocephalus, acute infarct, intra-axial, or extra-axial fluid collection.  IMPRESSION: 1. Mild motion degradation. 2. Given this factor, no acute intracranial abnormality. 3.  Cerebral atrophy and small vessel ischemic change.   Electronically Signed   By: Jeronimo Greaves M.D.   On: 07/17/2014 21:41   Ct Head Wo Contrast  07/16/2014   CLINICAL DATA:  Right-sided facial droop.  EXAM: CT HEAD WITHOUT CONTRAST  TECHNIQUE: Contiguous axial images were obtained from the base of the skull through the vertex without intravenous contrast.  COMPARISON:  None.  FINDINGS: Bony calvarium appears intact. Moderate diffuse cortical atrophy is noted. Mild chronic ischemic white matter  disease is noted. No mass effect or midline shift is noted. Ventricular size is within normal limits. There is no evidence of mass lesion, hemorrhage or acute infarction.  IMPRESSION: Moderate diffuse cortical atrophy. Mild chronic ischemic white matter disease. No acute intracranial abnormality seen.   Electronically Signed   By: Roque Lias M.D.   On: 07/16/2014 13:47   Mr Brain Wo Contrast  07/17/2014   CLINICAL DATA:  Weakness and RIGHT facial droop, confusion.  EXAM: MRI HEAD WITHOUT CONTRAST  MRA HEAD WITHOUT CONTRAST  TECHNIQUE: Multiplanar, multiecho pulse sequences of the brain and surrounding structures were obtained without intravenous contrast. Angiographic images of the head were obtained using MRA technique without contrast.  COMPARISON:  CT of the head July 16, 2014  FINDINGS: MRI HEAD FINDINGS  No reduced diffusion to suggest acute ischemia. Severely motion degraded axial gradient, limits sensitivity for blood products. The remainder of the examination is mild to moderately motion degraded. Moderate to severe ventriculomegaly, likely on the basis of global parenchymal brain volume loss as there is overall commensurate enlargement of cerebral sulci and cerebellar folia. Remote RIGHT superior cerebellar small infarct. Remote LEFT pontine remote infarct. Bilateral basal ganglia, LEFT thalamus remote lacunar infarcts. Confluent white matter changes suggest chronic small vessel ischemic disease.  No abnormal extra-axial fluid collections. Status post bilateral  ocular lens implants. No paranasal sinus air-fluid levels. No abnormal sellar expansion. No cerebellar tonsillar ectopia. No suspicious calvarial bone marrow signal.  MRA HEAD FINDINGS  Moderately motion degraded examination. Flow related enhancement within the cervical, petrous, cavernous and supra clinoid internal carotid arteries, with somewhat bulbous appearance of the RIGHT cavernous carotid artery which could be artifact. Flow related  enhancement within the anterior and middle cerebral arteries without large vessel occlusion. RIGHT A1 segment is dominant. Limited assessment for luminal irregularity and aneurysm.  RIGHT vertebral artery is dominant. LEFT vertebral artery appears to terminate in the posterior inferior cerebellar artery. Flow related enhancement within the basilar artery, limited assessment due to motion. Robust bilateral posterior communicating arteries provide the prominent vascular supply to the posterior cerebral arteries. No large vessel occlusion. Limited assessment for luminal irregularity and aneurysm due to motion.  IMPRESSION: MRI HEAD:  Motion degraded examination.  No acute ischemia.  Remote bilateral basal ganglia, LEFT thalamus lacunar infarcts. Remote small RIGHT cerebellar and LEFT pontine infarcts. Moderate to severe white matter changes suggest chronic small vessel ischemic disease.  MRA HEAD: Moderately motion degraded examination without large vessel occlusion.  Somewhat bulbous appearance of the RIGHT cavernous carotid artery, equivocal for aneurysm, considering patient's difficulty tolerating MRI, CT angiogram of the head could be performed as clinically indicated.   Electronically Signed   By: Awilda Metro   On: 07/17/2014 04:28   Mr Maxine Glenn Head/brain Wo Cm  07/17/2014   CLINICAL DATA:  Weakness and RIGHT facial droop, confusion.  EXAM: MRI HEAD WITHOUT CONTRAST  MRA HEAD WITHOUT CONTRAST  TECHNIQUE: Multiplanar, multiecho pulse sequences of the brain and surrounding structures were obtained without intravenous contrast. Angiographic images of the head were obtained using MRA technique without contrast.  COMPARISON:  CT of the head July 16, 2014  FINDINGS: MRI HEAD FINDINGS  No reduced diffusion to suggest acute ischemia. Severely motion degraded axial gradient, limits sensitivity for blood products. The remainder of the examination is mild to moderately motion degraded. Moderate to severe  ventriculomegaly, likely on the basis of global parenchymal brain volume loss as there is overall commensurate enlargement of cerebral sulci and cerebellar folia. Remote RIGHT superior cerebellar small infarct. Remote LEFT pontine remote infarct. Bilateral basal ganglia, LEFT thalamus remote lacunar infarcts. Confluent white matter changes suggest chronic small vessel ischemic disease.  No abnormal extra-axial fluid collections. Status post bilateral ocular lens implants. No paranasal sinus air-fluid levels. No abnormal sellar expansion. No cerebellar tonsillar ectopia. No suspicious calvarial bone marrow signal.  MRA HEAD FINDINGS  Moderately motion degraded examination. Flow related enhancement within the cervical, petrous, cavernous and supra clinoid internal carotid arteries, with somewhat bulbous appearance of the RIGHT cavernous carotid artery which could be artifact. Flow related enhancement within the anterior and middle cerebral arteries without large vessel occlusion. RIGHT A1 segment is dominant. Limited assessment for luminal irregularity and aneurysm.  RIGHT vertebral artery is dominant. LEFT vertebral artery appears to terminate in the posterior inferior cerebellar artery. Flow related enhancement within the basilar artery, limited assessment due to motion. Robust bilateral posterior communicating arteries provide the prominent vascular supply to the posterior cerebral arteries. No large vessel occlusion. Limited assessment for luminal irregularity and aneurysm due to motion.  IMPRESSION: MRI HEAD:  Motion degraded examination.  No acute ischemia.  Remote bilateral basal ganglia, LEFT thalamus lacunar infarcts. Remote small RIGHT cerebellar and LEFT pontine infarcts. Moderate to severe white matter changes suggest chronic small vessel ischemic disease.  MRA HEAD: Moderately motion  degraded examination without large vessel occlusion.  Somewhat bulbous appearance of the RIGHT cavernous carotid artery,  equivocal for aneurysm, considering patient's difficulty tolerating MRI, CT angiogram of the head could be performed as clinically indicated.   Electronically Signed   By: Awilda Metroourtnay  Bloomer   On: 07/17/2014 04:28    Medications:  Scheduled: . amLODipine  10 mg Oral Daily  . aspirin EC  325 mg Oral Daily  . atorvastatin  10 mg Oral q1800  . carvedilol  25 mg Oral BID WC  . cloNIDine  0.3 mg Oral TID  . furosemide  80 mg Intravenous BID  . heparin  5,000 Units Subcutaneous 3 times per day  . insulin aspart  0-9 Units Subcutaneous TID WC  . insulin aspart  4 Units Subcutaneous TID WC  . insulin glargine  30 Units Subcutaneous QHS    Assessment/Plan: 80 y.o. male brought in due to inability to walk since yesterday and report of right arm-face weakness. MRI brain negative for acute abnormality but multifocal subcortical infarcts, severe small vessel disease, and ventriculomegaly in the context of global cerebral atrophy. TTE normal, Carotid dopplers 1-39% stenosis bilaterally. LDL unable to obtain due to elevated cholesterol. A1C 9.5.  Although TIA a possibility,also possibility of an underlying dementia manifesting with gait apraxia, as he has not frank weakness lower extremities.  Over night had a brief staring spell concerning for possible seizure.  Will obtain EEG.  IF negative, no further neurology diagnostic testing recommended.    Felicie MornDavid Lavinia Mcneely PA-C [facility] [phone]  07/18/2014, 11:52 AM

## 2014-07-18 NOTE — Progress Notes (Signed)
Late entry for earlier shift event: RN paged this NP secondary to pt having an event lasting about 5 minutes with inability to talk or move right side. He did not have fixed gaze during event. After event, pt was confused, did not remember event and had increased slurred speech than prior to event. Stat CT head ordered to r/o new CVA or hemorrhage. This NP notified Dr. Roseanne RenoStewart of neurology of event and plan given neuro is involved in this pt's care. Pt came in with CVA vs TIA and has been put on ASA. Neuro to decide further tx plan. CT head negative for acute issues.  Jimmye NormanKaren Kirby-Graham, NP [facility]

## 2014-07-18 NOTE — Progress Notes (Signed)
[facility]                                                                              Patient Demographics  Casey Grimes, is a 80 y.o. male, DOB - 1928/10/20, XBM:[id number]  Admit date - 07/16/2014   Admitting Physician Clydia Llano, MD  Outpatient Primary MD for the patient is Judie Bonus, MD  LOS - 2   Chief Complaint  Patient presents with  . Weakness      HPI on 07/16/2014 by Dr. Clydia Llano Casey Grimes is a 80 y.o. male with past medical history of HTN, DM 2, ESRD and chronic lumbar back pain came into the hospital because of weakness. Patient is awake but confused about why he is in the hospital Saying he came in because he is having pain. No family members at bedside. The H&P obtained from ED and neurology notes. Apparently patient went to bed okay last night, when he woke up this morning he couldn't get off of the bed. EMS was called and brought him to the hospital for further evaluation. Questionable right facial droop noted. In the ED he was found to have blood pressure of 218/98, please note that his blood pressure from last PCP note was 190/62, creatinine 2.7. Chest x-ray and CT scan of the head both showed no abnormalities.  Assessment & Plan   Weakness/ambulatory dysfunction -MRI of the brain: Negative for acute abnormality however subcortical infarcts with small vessel disease -Neurology consultation appreciated -Possibly secondary to TIA however may also have underlying dementia with gait apraxia vs seizure -PT and OT consulted, PT recommended SNF -Echocardiogram: EF of 50-55%, grade 1 diastolic dysfunction -Carotid Doppler: Bilateral 1-39% ICA stenosis, moderate uterine regular heterogeneous plaque noted. Vertebral artery flow is antegrade. Left ECA greater than 50% stenosis -TSH 2.383, vitamin B-12 432 -Patient had episode of inability to talk or move his right side with fixed gaze for approximately 5 minutes overnight. CT of the head was ordered  and was negative for acute findings. -Spoke with neurology today, question of possible seizure, pending EEG  Hypertension -Initially allowed for permissive hypertension however MRI negative for stroke -Continue amlodipine, clonidine, Coreg, Lasix  Chronic kidney disease, stage IV -Creatinine currently stable, continue to monitor BMP  Hypokalemia -Will replace and continue to monitor -Likely secondary to his Lasix  Diabetes mellitus -Hemoglobin A1c 9.5 -Continue insulin sliding-scale CBG monitoring -Upon discharge, patient will need a follow-up with his primary care physician for tighter control  Hyperlipidemia -Lipid panel: Total cholesterol 127, triglycerides 135, HDL 34, LDL 66 -Continue statin  Code Status: Full  Family Communication: None at bedside, Spoke to wife.  (Patient has been in his usual state of health and "normal" until this past Monday.)  Disposition Plan: Admitted  Time Spent in minutes   30 minutes  Procedures  Echocardiogram  Carotid doppler  Consults   Neurology  DVT Prophylaxis  heparin  Lab Results  Component Value Date   PLT 282 07/17/2014    Medications  Scheduled Meds: . amLODipine  10 mg Oral Daily  . aspirin EC  325 mg Oral Daily  . atorvastatin  10 mg Oral q1800  . carvedilol  25 mg Oral BID WC  . cloNIDine  0.3 mg Oral TID  . furosemide  80 mg Intravenous BID  . heparin  5,000 Units Subcutaneous 3 times per day  . insulin aspart  0-9 Units Subcutaneous TID WC  . insulin aspart  4 Units Subcutaneous TID WC  . insulin glargine  30 Units Subcutaneous QHS  . potassium chloride  40 mEq Oral Once   Continuous Infusions:  PRN Meds:.hydrALAZINE  Antibiotics    Anti-infectives    None        Subjective:   Casey Grimes seen and examined today.  Patient unable to answer questions.  Sitter at bedside.  Patient has no complaints.    Objective:   Filed Vitals:   07/18/14 0000 07/18/14 0300 07/18/14 0400 07/18/14 0630  BP:   190/73  157/68  Pulse:  69    Temp:  98.6 F (37 C)    TempSrc:  Oral    Resp: 17 20 18    Height:      Weight:      SpO2:  99%      Wt Readings from Last 3 Encounters:  07/16/14 92.761 kg (204 lb 8 oz)  03/26/14 99.247 kg (218 lb 12.8 oz)  07/31/13 112.946 kg (249 lb)     Intake/Output Summary (Last 24 hours) at 07/18/14 1010 Last data filed at 07/18/14 0912  Gross per 24 hour  Intake    720 ml  Output    900 ml  Net   -180 ml    Exam  General: Well developed, well nourished, NAD, appears stated age  HEENT: NCAT, mucous membranes moist.   Cardiovascular: S1 S2 auscultated, Regular rate and rhythm.  Respiratory: Clear to auscultation bilaterally with equal chest rise  Abdomen: Soft, nontender, nondistended, + bowel sounds  Extremities: warm dry without cyanosis clubbing or edema  Neuro: Awake and alert, not oriented.  Can follow directions and answer simple yes/no questions.  Data Review   Micro Results Recent Results (from the past 240 hour(s))  Urine culture     Status: None   Collection Time: 07/16/14  6:50 PM  Result Value Ref Range Status   Specimen Description URINE, CLEAN CATCH  Final   Special Requests NONE  Final   Colony Count NO GROWTH Performed at [facility]   Final   Culture NO GROWTH Performed at [facility]   Final   Report Status 07/17/2014 FINAL  Final    Radiology Reports Dg Chest 2 View  07/16/2014   CLINICAL DATA:  Weakness and right-sided facial droop  EXAM: CHEST  2 VIEW  COMPARISON:  July 23, 2013  FINDINGS: There is no edema or consolidation. There is a tiny granuloma in the right upper lobe. Heart size and pulmonary vascularity are within normal limits. No adenopathy. Prominence along the paratracheal regions is probably due to great vessel prominence in this age group. No bony lesions.  IMPRESSION: Tiny granuloma right upper lobe.  No edema or consolidation.   Electronically Signed   By: Bretta Bang  M.D.   On: 07/16/2014 13:53   Ct Head Wo Contrast  07/17/2014   CLINICAL DATA:  Altered mental state.  Transient ischemic attack.  EXAM: CT HEAD WITHOUT CONTRAST  TECHNIQUE: Contiguous axial images were obtained from the base of the skull through the vertex without intravenous contrast.  COMPARISON:  MRI of earlier today and CT of 1 day prior.  FINDINGS: Sinuses/Soft tissues: Motion degradation. Surgical changes about  the globes. Cerumen in the right external ear canal. Clear paranasal sinuses and mastoid air cells.  Intracranial: Advanced cerebral atrophy. Mild low density in the periventricular white matter likely related to small vessel disease. No mass lesion, hemorrhage, hydrocephalus, acute infarct, intra-axial, or extra-axial fluid collection.  IMPRESSION: 1. Mild motion degradation. 2. Given this factor, no acute intracranial abnormality. 3.  Cerebral atrophy and small vessel ischemic change.   Electronically Signed   By: Jeronimo Greaves M.D.   On: 07/17/2014 21:41   Ct Head Wo Contrast  07/16/2014   CLINICAL DATA:  Right-sided facial droop.  EXAM: CT HEAD WITHOUT CONTRAST  TECHNIQUE: Contiguous axial images were obtained from the base of the skull through the vertex without intravenous contrast.  COMPARISON:  None.  FINDINGS: Bony calvarium appears intact. Moderate diffuse cortical atrophy is noted. Mild chronic ischemic white matter disease is noted. No mass effect or midline shift is noted. Ventricular size is within normal limits. There is no evidence of mass lesion, hemorrhage or acute infarction.  IMPRESSION: Moderate diffuse cortical atrophy. Mild chronic ischemic white matter disease. No acute intracranial abnormality seen.   Electronically Signed   By: Roque Lias M.D.   On: 07/16/2014 13:47   Mr Brain Wo Contrast  07/17/2014   CLINICAL DATA:  Weakness and RIGHT facial droop, confusion.  EXAM: MRI HEAD WITHOUT CONTRAST  MRA HEAD WITHOUT CONTRAST  TECHNIQUE: Multiplanar, multiecho pulse  sequences of the brain and surrounding structures were obtained without intravenous contrast. Angiographic images of the head were obtained using MRA technique without contrast.  COMPARISON:  CT of the head July 16, 2014  FINDINGS: MRI HEAD FINDINGS  No reduced diffusion to suggest acute ischemia. Severely motion degraded axial gradient, limits sensitivity for blood products. The remainder of the examination is mild to moderately motion degraded. Moderate to severe ventriculomegaly, likely on the basis of global parenchymal brain volume loss as there is overall commensurate enlargement of cerebral sulci and cerebellar folia. Remote RIGHT superior cerebellar small infarct. Remote LEFT pontine remote infarct. Bilateral basal ganglia, LEFT thalamus remote lacunar infarcts. Confluent white matter changes suggest chronic small vessel ischemic disease.  No abnormal extra-axial fluid collections. Status post bilateral ocular lens implants. No paranasal sinus air-fluid levels. No abnormal sellar expansion. No cerebellar tonsillar ectopia. No suspicious calvarial bone marrow signal.  MRA HEAD FINDINGS  Moderately motion degraded examination. Flow related enhancement within the cervical, petrous, cavernous and supra clinoid internal carotid arteries, with somewhat bulbous appearance of the RIGHT cavernous carotid artery which could be artifact. Flow related enhancement within the anterior and middle cerebral arteries without large vessel occlusion. RIGHT A1 segment is dominant. Limited assessment for luminal irregularity and aneurysm.  RIGHT vertebral artery is dominant. LEFT vertebral artery appears to terminate in the posterior inferior cerebellar artery. Flow related enhancement within the basilar artery, limited assessment due to motion. Robust bilateral posterior communicating arteries provide the prominent vascular supply to the posterior cerebral arteries. No large vessel occlusion. Limited assessment for luminal  irregularity and aneurysm due to motion.  IMPRESSION: MRI HEAD:  Motion degraded examination.  No acute ischemia.  Remote bilateral basal ganglia, LEFT thalamus lacunar infarcts. Remote small RIGHT cerebellar and LEFT pontine infarcts. Moderate to severe white matter changes suggest chronic small vessel ischemic disease.  MRA HEAD: Moderately motion degraded examination without large vessel occlusion.  Somewhat bulbous appearance of the RIGHT cavernous carotid artery, equivocal for aneurysm, considering patient's difficulty tolerating MRI, CT angiogram of the head could be performed  as clinically indicated.   Electronically Signed   By: Awilda Metro   On: 07/17/2014 04:28   Mr Maxine Glenn Head/brain Wo Cm  07/17/2014   CLINICAL DATA:  Weakness and RIGHT facial droop, confusion.  EXAM: MRI HEAD WITHOUT CONTRAST  MRA HEAD WITHOUT CONTRAST  TECHNIQUE: Multiplanar, multiecho pulse sequences of the brain and surrounding structures were obtained without intravenous contrast. Angiographic images of the head were obtained using MRA technique without contrast.  COMPARISON:  CT of the head July 16, 2014  FINDINGS: MRI HEAD FINDINGS  No reduced diffusion to suggest acute ischemia. Severely motion degraded axial gradient, limits sensitivity for blood products. The remainder of the examination is mild to moderately motion degraded. Moderate to severe ventriculomegaly, likely on the basis of global parenchymal brain volume loss as there is overall commensurate enlargement of cerebral sulci and cerebellar folia. Remote RIGHT superior cerebellar small infarct. Remote LEFT pontine remote infarct. Bilateral basal ganglia, LEFT thalamus remote lacunar infarcts. Confluent white matter changes suggest chronic small vessel ischemic disease.  No abnormal extra-axial fluid collections. Status post bilateral ocular lens implants. No paranasal sinus air-fluid levels. No abnormal sellar expansion. No cerebellar tonsillar ectopia. No  suspicious calvarial bone marrow signal.  MRA HEAD FINDINGS  Moderately motion degraded examination. Flow related enhancement within the cervical, petrous, cavernous and supra clinoid internal carotid arteries, with somewhat bulbous appearance of the RIGHT cavernous carotid artery which could be artifact. Flow related enhancement within the anterior and middle cerebral arteries without large vessel occlusion. RIGHT A1 segment is dominant. Limited assessment for luminal irregularity and aneurysm.  RIGHT vertebral artery is dominant. LEFT vertebral artery appears to terminate in the posterior inferior cerebellar artery. Flow related enhancement within the basilar artery, limited assessment due to motion. Robust bilateral posterior communicating arteries provide the prominent vascular supply to the posterior cerebral arteries. No large vessel occlusion. Limited assessment for luminal irregularity and aneurysm due to motion.  IMPRESSION: MRI HEAD:  Motion degraded examination.  No acute ischemia.  Remote bilateral basal ganglia, LEFT thalamus lacunar infarcts. Remote small RIGHT cerebellar and LEFT pontine infarcts. Moderate to severe white matter changes suggest chronic small vessel ischemic disease.  MRA HEAD: Moderately motion degraded examination without large vessel occlusion.  Somewhat bulbous appearance of the RIGHT cavernous carotid artery, equivocal for aneurysm, considering patient's difficulty tolerating MRI, CT angiogram of the head could be performed as clinically indicated.   Electronically Signed   By: Awilda Metro   On: 07/17/2014 04:28    CBC  Recent Labs Lab 07/16/14 1310 07/17/14 0737  WBC 6.4 8.4  HGB 13.8 13.8  HCT 41.6 41.0  PLT 266 282  MCV 82.5 84.5  MCH 27.4 28.5  MCHC 33.2 33.7  RDW 15.4 15.7*  LYMPHSABS 1.6  --   MONOABS 0.5  --   EOSABS 0.3  --   BASOSABS 0.0  --     Chemistries   Recent Labs Lab 07/16/14 1310 07/17/14 0737  NA 141 141  K 3.5 3.2*  CL 108  109  CO2 23 22  GLUCOSE 201* 198*  BUN 46* 51*  CREATININE 2.78* 2.94*  CALCIUM 9.6 9.5  AST 22  --   ALT 19  --   ALKPHOS 76  --   BILITOT 0.9  --    ------------------------------------------------------------------------------------------------------------------ estimated creatinine clearance is 22.2 mL/min (by C-G formula based on Cr of 2.94). ------------------------------------------------------------------------------------------------------------------  Recent Labs  07/16/14 1310  HGBA1C 9.5*   ------------------------------------------------------------------------------------------------------------------  Recent Labs  07/17/14  0737  CHOL 127  HDL 34*  LDLCALC 66  TRIG 811  CHOLHDL 3.7   ------------------------------------------------------------------------------------------------------------------  Recent Labs  07/17/14 1436  TSH 2.383   ------------------------------------------------------------------------------------------------------------------  Recent Labs  07/17/14 1436  VITAMINB12 432    Coagulation profile No results for input(s): INR, PROTIME in the last 168 hours.  No results for input(s): DDIMER in the last 72 hours.  Cardiac Enzymes No results for input(s): CKMB, TROPONINI, MYOGLOBIN in the last 168 hours.  Invalid input(s): CK ------------------------------------------------------------------------------------------------------------------ Invalid input(s): POCBNP    Media Pizzini D.O. on 07/18/2014 at 10:10 AM  Between 7am to 7pm - Pager - [phone]  After 7pm go to [email or web address] - password [email or web address]  And look for the night coverage person covering for me after hours  [facility] Office  [phone]

## 2014-07-19 LAB — CBC
HCT: 37.5 % — ABNORMAL LOW (ref 39.0–52.0)
HEMOGLOBIN: 12.4 g/dL — AB (ref 13.0–17.0)
MCH: 27.8 pg (ref 26.0–34.0)
MCHC: 33.1 g/dL (ref 30.0–36.0)
MCV: 84.1 fL (ref 78.0–100.0)
Platelets: 246 10*3/uL (ref 150–400)
RBC: 4.46 MIL/uL (ref 4.22–5.81)
RDW: 15.7 % — ABNORMAL HIGH (ref 11.5–15.5)
WBC: 5.6 10*3/uL (ref 4.0–10.5)

## 2014-07-19 LAB — BASIC METABOLIC PANEL
ANION GAP: 9 (ref 5–15)
BUN: 62 mg/dL — ABNORMAL HIGH (ref 6–23)
CALCIUM: 9 mg/dL (ref 8.4–10.5)
CO2: 23 mmol/L (ref 19–32)
Chloride: 109 mEq/L (ref 96–112)
Creatinine, Ser: 2.99 mg/dL — ABNORMAL HIGH (ref 0.50–1.35)
GFR calc Af Amer: 21 mL/min — ABNORMAL LOW (ref >90)
GFR calc non Af Amer: 18 mL/min — ABNORMAL LOW (ref >90)
Glucose, Bld: 193 mg/dL — ABNORMAL HIGH (ref 70–99)
Potassium: 3.7 mmol/L (ref 3.5–5.1)
Sodium: 141 mmol/L (ref 135–145)

## 2014-07-19 LAB — GLUCOSE, CAPILLARY
GLUCOSE-CAPILLARY: 224 mg/dL — AB (ref 70–99)
GLUCOSE-CAPILLARY: 100 mg/dL — AB (ref 70–99)
Glucose-Capillary: 201 mg/dL — ABNORMAL HIGH (ref 70–99)

## 2014-07-19 MED ORDER — LEVETIRACETAM 250 MG PO TABS: 250.0000 mg | ORAL_TABLET | Freq: Two times a day (BID) | ORAL | Status: DC

## 2014-07-19 NOTE — Discharge Summary (Signed)
Physician Discharge Summary  Casey Grimes ZOX:[id number] DOB: 1929/06/06 DOA: 07/16/2014  PCP: Judie Bonus, MD  Admit date: 07/16/2014 Discharge date: 07/19/2014  Time spent: 45 minutes  Recommendations for Outpatient Follow-up:  Patient will be discharged to [facility] health care center and should continue physical and occupational therapy as recommended by the facility.  He will also need to follow up with his primary care physician within 1-2 weeks of discharge.  Patient should also followup with [facility] for further neurological testing.  Patient should continue his medications as prescribed./  Patient should follow a heart healthy/ carb modified diet.  Discharge Diagnoses:  Weakness/laboratory dysfunction Hypertension Chronic kidney disease, stage IV Hypokalemia Diabetes mellitus Hyperlipidemia  Discharge Condition: Stable  Diet recommendation: Heart healthy/carb modified  Filed Weights   07/16/14 1524  Weight: 92.761 kg (204 lb 8 oz)    History of present illness:  on 07/16/2014 by Dr. Jonathon Jordan Grimes is a 81 y.o. male with past medical history of HTN, DM 2, ESRD and chronic lumbar back pain came into the hospital because of weakness. Patient is awake but confused about why he is in the hospital Saying he came in because he is having pain. No family members at bedside. The H&P obtained from ED and neurology notes. Apparently patient went to bed okay last night, when he woke up this morning he couldn't get off of the bed. EMS was called and brought him to the hospital for further evaluation. Questionable right facial droop noted. In the ED he was found to have blood pressure of 218/98, please note that his blood pressure from last PCP note was 190/62, creatinine 2.7. Chest x-ray and CT scan of the head both showed no abnormalities.  Hospital Course:  Weakness/ambulatory dysfunction -MRI of the brain: Negative for acute abnormality  however subcortical infarcts with small vessel disease -Neurology consultation appreciated -Possibly secondary to TIA however may also have underlying dementia with gait apraxia vs seizure -PT and OT consulted, PT recommended SNF -Echocardiogram: EF of 50-55%, grade 1 diastolic dysfunction -Carotid Doppler: Bilateral 1-39% ICA stenosis, moderate uterine regular heterogeneous plaque noted. Vertebral artery flow is antegrade. Left ECA greater than 50% stenosis -TSH 2.383, vitamin B-12 432 -Patient had episode of inability to talk or move his right side with fixed gaze for approximately 5 minutes overnight. CT of the head was ordered and was negative for acute findings. -Spoke with neurology today, question of possible seizure -EEG: Characterized by slowing which is consistent with normal drowsy, cannot rule out possibility of slightly related to general cerebral disturbance such as metabolic encephalopathy. No elective form activity is noted -Spoke with Dr. Cyril Mourning and he recommended starting low dose Keppra 250mg  BID PO and further outpatient neurologic workup -Spoke with wife and she understood and agreed  Hypertension -Initially allowed for permissive hypertension however MRI negative for stroke -Continue amlodipine, clonidine, Coreg, Lasix  Chronic kidney disease, stage IV -Creatinine currently stable, continue to monitor BMP  Hypokalemia -Will replace and continue to monitor -Likely secondary to his Lasix  Diabetes mellitus -Hemoglobin A1c 9.5 -Continue insulin sliding-scale CBG monitoring -Upon discharge, patient will need a follow-up with his primary care physician for tighter control  Hyperlipidemia -Lipid panel: Total cholesterol 127, triglycerides 135, HDL 34, LDL 66 -Continue statin  Procedures  Echocardiogram  Carotid doppler EEG  Consults  Neurology  Discharge Exam: Filed Vitals:   07/19/14 0500  BP: 143/90  Pulse: 61  Temp: 98.5 F (36.9 C)  Resp:  18      General: Well developed, well nourished, NAD, appears stated age  HEENT: NCAT, mucous membranes moist.  Cardiovascular: S1 S2 auscultated, RRR  Respiratory: Clear to auscultation bilaterally with equal chest rise  Abdomen: Soft, nontender, nondistended, + bowel sounds  Extremities: warm dry without cyanosis clubbing or edema  Neuro: Awake and alert, oriented to self.  No focal deficits  Discharge Instructions      Discharge Instructions    Discharge instructions    Complete by:  As directed   Patient will be discharged to [facility] care center and should continue physical and occupational therapy as recommended by the facility.  He will also need to follow up with his primary care physician within 1-2 weeks of discharge.  Patient should also followup with [facility] for further neurological testing.  Patient should continue his medications as prescribed./  Patient should follow a heart healthy/ carb modified diet.            Medication List    STOP taking these medications        gabapentin 300 MG capsule  Commonly known as:  NEURONTIN     sodium bicarbonate 650 MG tablet      TAKE these medications        allopurinol 300 MG tablet  Commonly known as:  ZYLOPRIM  Take 150 mg by mouth daily.     amLODipine 10 MG tablet  Commonly known as:  NORVASC  TAKE 1 TABLET BY MOUTH EVERY DAY     aspirin EC 325 MG tablet  Take 325 mg by mouth daily.     atorvastatin 10 MG tablet  Commonly known as:  LIPITOR  TAKE 1 TABLET (10 MG TOTAL) BY MOUTH DAILY.     calcitRIOL 0.25 MCG capsule  Commonly known as:  ROCALTROL  Take 0.25 mcg by mouth daily.     carvedilol 25 MG tablet  Commonly known as:  COREG  Take 1 tablet (25 mg total) by mouth 2 (two) times daily with a meal.     cloNIDine 0.3 MG tablet  Commonly known as:  CATAPRES  Take 0.3 mg by mouth 3 (three) times daily.     doxazosin 4 MG tablet  Commonly known as:  CARDURA  TAKE  1 TABLET (4 MG TOTAL) BY MOUTH AT BEDTIME.     furosemide 80 MG tablet  Commonly known as:  LASIX  Take 1-2 tablets (80-160 mg total) by mouth See admin instructions. Takes 160mg  in the morning and 80mg  in the evening     insulin glargine 100 UNIT/ML injection  Commonly known as:  LANTUS  Inject 0.4 mLs (40 Units total) into the skin at bedtime.     insulin lispro 100 UNIT/ML injection  Commonly known as:  HUMALOG  Inject 0.1 mLs (10 Units total) into the skin See admin instructions. Take 10 units before each meal     Insulin Syringe-Needle U-100 30G X 1/2" 0.5 ML Misc  Commonly known as:  B-D INS SYRINGE 0.5CC/30GX1/2"  USE 3 PER DAY AS DIRECTED     levETIRAcetam 250 MG tablet  Commonly known as:  KEPPRA  Take 1 tablet (250 mg total) by mouth 2 (two) times daily.     potassium chloride SA 20 MEQ tablet  Commonly known as:  KLOR-CON M20  TAKE 1 TABLET (20 MEQ TOTAL) BY MOUTH 2 (TWO) TIMES DAILY.     promethazine 12.5 MG tablet  Commonly known as:  PHENERGAN  Take 1 tablet (12.5 mg total) by mouth every 6 (six) hours as needed for nausea.       No Known Allergies Follow-up Information    Follow up with Judie Bonus, MD. Schedule an appointment as soon as possible for a visit in 1 week.   Specialty:  Internal Medicine   Why:  Hospital followup   Contact information:   [address] [place] [address] [phone]       Follow up with [facility].   Specialty:  Neurology   Why:  Further workup for possible dementia   Contact information:   [address] [place] [address] [phone]       The results of significant diagnostics from this hospitalization (including imaging, microbiology, ancillary and laboratory) are listed below for reference.    Significant Diagnostic Studies: Dg Chest 2 View  07/16/2014   CLINICAL DATA:  Weakness and right-sided facial droop  EXAM: CHEST  2 VIEW  COMPARISON:  July 23, 2013  FINDINGS: There is no edema or consolidation. There is a tiny granuloma in the right upper lobe. Heart size and pulmonary vascularity are within normal limits. No adenopathy. Prominence along the paratracheal regions is probably due to great vessel prominence in this age group. No bony lesions.  IMPRESSION: Tiny granuloma right upper lobe.  No edema or consolidation.   Electronically Signed   By: Bretta Bang M.D.   On: 07/16/2014 13:53   Ct Head Wo Contrast  07/17/2014   CLINICAL DATA:  Altered mental state.  Transient ischemic attack.  EXAM: CT HEAD WITHOUT CONTRAST  TECHNIQUE: Contiguous axial images were obtained from the base of the skull through the vertex without intravenous contrast.  COMPARISON:  MRI of earlier today and CT of 1 day prior.  FINDINGS: Sinuses/Soft tissues: Motion degradation. Surgical changes about the globes. Cerumen in the right external ear canal. Clear paranasal sinuses and mastoid air cells.  Intracranial: Advanced cerebral atrophy. Mild low density in the periventricular white matter likely related to small vessel disease. No mass lesion, hemorrhage, hydrocephalus, acute infarct, intra-axial, or extra-axial fluid collection.  IMPRESSION: 1. Mild motion degradation. 2. Given this factor, no acute intracranial abnormality. 3.  Cerebral atrophy and small vessel ischemic change.   Electronically Signed   By: Jeronimo Greaves M.D.   On: 07/17/2014 21:41   Ct Head Wo Contrast  07/16/2014   CLINICAL DATA:  Right-sided facial droop.  EXAM: CT HEAD WITHOUT CONTRAST  TECHNIQUE: Contiguous axial images were obtained from the base of the skull through the vertex without intravenous contrast.  COMPARISON:  None.  FINDINGS: Bony calvarium appears intact. Moderate diffuse cortical atrophy is noted. Mild chronic ischemic white matter disease is noted. No mass effect or midline shift is noted. Ventricular size is within normal limits. There is no evidence of mass lesion, hemorrhage or  acute infarction.  IMPRESSION: Moderate diffuse cortical atrophy. Mild chronic ischemic white matter disease. No acute intracranial abnormality seen.   Electronically Signed   By: Roque Lias M.D.   On: 07/16/2014 13:47   Mr Brain Wo Contrast  07/17/2014   CLINICAL DATA:  Weakness and RIGHT facial droop, confusion.  EXAM: MRI HEAD WITHOUT CONTRAST  MRA HEAD WITHOUT CONTRAST  TECHNIQUE: Multiplanar, multiecho pulse sequences of the brain and surrounding structures were obtained without intravenous contrast. Angiographic images of the head were obtained using MRA technique without contrast.  COMPARISON:  CT of the head July 16, 2014  FINDINGS: MRI HEAD FINDINGS  No reduced diffusion to suggest acute ischemia. Severely motion degraded axial gradient, limits sensitivity for blood products. The remainder of the examination is mild to moderately motion degraded. Moderate to severe ventriculomegaly, likely on the basis of global parenchymal brain volume loss as there is overall commensurate enlargement of cerebral sulci and cerebellar folia. Remote RIGHT superior cerebellar small infarct. Remote LEFT pontine remote infarct. Bilateral basal ganglia, LEFT thalamus remote lacunar infarcts. Confluent white matter changes suggest chronic small vessel ischemic disease.  No abnormal extra-axial fluid collections. Status post bilateral ocular lens implants. No paranasal sinus air-fluid levels. No abnormal sellar expansion. No cerebellar tonsillar ectopia. No suspicious calvarial bone marrow signal.  MRA HEAD FINDINGS  Moderately motion degraded examination. Flow related enhancement within the cervical, petrous, cavernous and supra clinoid internal carotid arteries, with somewhat bulbous appearance of the RIGHT cavernous carotid artery which could be artifact. Flow related enhancement within the anterior and middle cerebral arteries without large vessel occlusion. RIGHT A1 segment is dominant. Limited assessment for  luminal irregularity and aneurysm.  RIGHT vertebral artery is dominant. LEFT vertebral artery appears to terminate in the posterior inferior cerebellar artery. Flow related enhancement within the basilar artery, limited assessment due to motion. Robust bilateral posterior communicating arteries provide the prominent vascular supply to the posterior cerebral arteries. No large vessel occlusion. Limited assessment for luminal irregularity and aneurysm due to motion.  IMPRESSION: MRI HEAD:  Motion degraded examination.  No acute ischemia.  Remote bilateral basal ganglia, LEFT thalamus lacunar infarcts. Remote small RIGHT cerebellar and LEFT pontine infarcts. Moderate to severe white matter changes suggest chronic small vessel ischemic disease.  MRA HEAD: Moderately motion degraded examination without large vessel occlusion.  Somewhat bulbous appearance of the RIGHT cavernous carotid artery, equivocal for aneurysm, considering patient's difficulty tolerating MRI, CT angiogram of the head could be performed as clinically indicated.   Electronically Signed   By: Awilda Metro   On: 07/17/2014 04:28   Mr Maxine Glenn Head/brain Wo Cm  07/17/2014   CLINICAL DATA:  Weakness and RIGHT facial droop, confusion.  EXAM: MRI HEAD WITHOUT CONTRAST  MRA HEAD WITHOUT CONTRAST  TECHNIQUE: Multiplanar, multiecho pulse sequences of the brain and surrounding structures were obtained without intravenous contrast. Angiographic images of the head were obtained using MRA technique without contrast.  COMPARISON:  CT of the head July 16, 2014  FINDINGS: MRI HEAD FINDINGS  No reduced diffusion to suggest acute ischemia. Severely motion degraded axial gradient, limits sensitivity for blood products. The remainder of the examination is mild to moderately motion degraded. Moderate to severe ventriculomegaly, likely on the basis of global parenchymal brain volume loss as there is overall commensurate enlargement of cerebral sulci and cerebellar  folia. Remote RIGHT superior cerebellar small infarct. Remote LEFT pontine remote infarct. Bilateral basal ganglia, LEFT thalamus remote lacunar infarcts. Confluent white matter changes suggest chronic small vessel ischemic disease.  No abnormal extra-axial fluid collections. Status post bilateral ocular lens implants. No paranasal sinus air-fluid levels. No abnormal sellar expansion. No cerebellar tonsillar ectopia. No suspicious calvarial bone marrow signal.  MRA HEAD FINDINGS  Moderately motion degraded examination. Flow related enhancement within the cervical, petrous, cavernous and supra clinoid internal carotid arteries, with somewhat bulbous appearance of the RIGHT cavernous carotid artery which could be artifact. Flow related enhancement within the anterior and middle cerebral arteries without large vessel occlusion. RIGHT A1 segment is dominant. Limited assessment for luminal irregularity and aneurysm.  RIGHT vertebral artery is dominant. LEFT vertebral  artery appears to terminate in the posterior inferior cerebellar artery. Flow related enhancement within the basilar artery, limited assessment due to motion. Robust bilateral posterior communicating arteries provide the prominent vascular supply to the posterior cerebral arteries. No large vessel occlusion. Limited assessment for luminal irregularity and aneurysm due to motion.  IMPRESSION: MRI HEAD:  Motion degraded examination.  No acute ischemia.  Remote bilateral basal ganglia, LEFT thalamus lacunar infarcts. Remote small RIGHT cerebellar and LEFT pontine infarcts. Moderate to severe white matter changes suggest chronic small vessel ischemic disease.  MRA HEAD: Moderately motion degraded examination without large vessel occlusion.  Somewhat bulbous appearance of the RIGHT cavernous carotid artery, equivocal for aneurysm, considering patient's difficulty tolerating MRI, CT angiogram of the head could be performed as clinically indicated.   Electronically  Signed   By: Awilda Metro   On: 07/17/2014 04:28    Microbiology: Recent Results (from the past 240 hour(s))  Urine culture     Status: None   Collection Time: 07/16/14  6:50 PM  Result Value Ref Range Status   Specimen Description URINE, CLEAN CATCH  Final   Special Requests NONE  Final   Colony Count NO GROWTH Performed at [facility]   Final   Culture NO GROWTH Performed at [facility]   Final   Report Status 07/17/2014 FINAL  Final     Labs: Basic Metabolic Panel:  Recent Labs Lab 07/16/14 1310 07/17/14 0737 07/18/14 0546  NA 141 141 141  K 3.5 3.2* 3.6  CL 108 109 110  CO2 23 22 17*  GLUCOSE 201* 198* 211*  BUN 46* 51* 67*  CREATININE 2.78* 2.94* 3.31*  CALCIUM 9.6 9.5 8.9   Liver Function Tests:  Recent Labs Lab 07/16/14 1310  AST 22  ALT 19  ALKPHOS 76  BILITOT 0.9  PROT 7.9  ALBUMIN 3.6   No results for input(s): LIPASE, AMYLASE in the last 168 hours. No results for input(s): AMMONIA in the last 168 hours. CBC:  Recent Labs Lab 07/16/14 1310 07/17/14 0737 07/19/14 0558  WBC 6.4 8.4 5.6  NEUTROABS 4.0  --   --   HGB 13.8 13.8 12.4*  HCT 41.6 41.0 37.5*  MCV 82.5 84.5 84.1  PLT 266 282 246   Cardiac Enzymes: No results for input(s): CKTOTAL, CKMB, CKMBINDEX, TROPONINI in the last 168 hours. BNP: BNP (last 3 results)  Recent Labs  07/23/13 0255 07/24/13 0705  PROBNP 914.1* 1745.0*   CBG:  Recent Labs Lab 07/18/14 1115 07/18/14 1624 07/18/14 2105 07/18/14 2239 07/19/14 0637  GLUCAP 170* 135* 224* 220* 201*       Signed:  Sydnie Sigmund  [facility] 07/19/2014, 10:02 AM

## 2014-07-19 NOTE — Progress Notes (Signed)
Patient d/c to the SNF, report called to the receiving nurse Misty Stanley. Condition stable.

## 2014-07-19 NOTE — Clinical Social Work Note (Signed)
Clinical Social Worker facilitated patient discharge including contacting patient family and facility to confirm patient discharge plans.  Clinical information faxed to facility and family agreeable with plan.  CSW arranged ambulance transport via [facility] to [facility].  RN to call report [phone]) prior to discharge.  DC packet prepared and on chart for transport.   Clinical Social Worker will sign off for now as social work intervention is no longer needed. Please consult [place] again if new need arises.   Derenda Fennel, MSW, LCSWA [phone] 07/19/2014 11:20 AM

## 2014-07-22 LAB — GLUCOSE, CAPILLARY: Glucose-Capillary: 108 mg/dL — ABNORMAL HIGH (ref 70–99)

## 2014-07-27 DIAGNOSIS — R410 Disorientation, unspecified: Secondary | ICD-10-CM | POA: Insufficient documentation

## 2014-07-29 ENCOUNTER — Encounter: Payer: Self-pay | Admitting: Gastroenterology

## 2014-07-31 ENCOUNTER — Encounter: Payer: Self-pay | Admitting: Diagnostic Neuroimaging

## 2014-07-31 ENCOUNTER — Ambulatory Visit (INDEPENDENT_AMBULATORY_CARE_PROVIDER_SITE_OTHER): Payer: Medicare Other | Admitting: Diagnostic Neuroimaging

## 2014-07-31 VITALS — BP 141/74 | HR 70 | Temp 98.3°F | Ht 71.0 in | Wt 208.6 lb

## 2014-07-31 DIAGNOSIS — F03B0 Unspecified dementia, moderate, without behavioral disturbance, psychotic disturbance, mood disturbance, and anxiety: Secondary | ICD-10-CM

## 2014-07-31 DIAGNOSIS — G9341 Metabolic encephalopathy: Secondary | ICD-10-CM

## 2014-07-31 DIAGNOSIS — F0390 Unspecified dementia without behavioral disturbance: Secondary | ICD-10-CM
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
## 2014-07-31 NOTE — Patient Instructions (Signed)
I will setup palliative care consult.

## 2014-08-21 ENCOUNTER — Telehealth: Payer: Self-pay | Admitting: *Deleted

## 2014-08-21 NOTE — Telephone Encounter (Signed)
Left msg on triage [facility] for safety teaching ADL's transferred...Raechel Chute/lmb

## 2014-08-22 NOTE — Telephone Encounter (Signed)
Verbal order approved for [facility] OT.

## 2014-08-22 NOTE — Telephone Encounter (Signed)
Called Clydie BraunKaren no answer LMOM with md response...Raechel Chute/lmb

## 2014-08-23 ENCOUNTER — Ambulatory Visit (INDEPENDENT_AMBULATORY_CARE_PROVIDER_SITE_OTHER): Payer: Medicare Other | Admitting: Internal Medicine

## 2014-08-23 ENCOUNTER — Encounter: Payer: Self-pay | Admitting: Internal Medicine

## 2014-08-23 VITALS — BP 158/60 | HR 61 | Temp 97.7°F | Resp 14 | Ht 71.0 in | Wt 215.1 lb

## 2014-08-23 DIAGNOSIS — N184 Chronic kidney disease, stage 4 (severe): Secondary | ICD-10-CM

## 2014-08-23 DIAGNOSIS — R531 Weakness: Secondary | ICD-10-CM

## 2014-08-23 DIAGNOSIS — E1121 Type 2 diabetes mellitus with diabetic nephropathy: Secondary | ICD-10-CM

## 2014-08-23 NOTE — Patient Instructions (Signed)
You do not need to worry about the glimipiride. We do not think he should be taking it.   I will email his neurologist to see if he can stop taking the levetiracetam. We will call you back to let you know.  We do not need to do blood work today and will see you back in about 2-3 months.   Please call us if there is anything you need or questions.

## 2014-08-23 NOTE — Addendum Note (Signed)
Addended by: Genella MechKOLLAR, ELIZABETH A on: 08/23/2014 07:59 PM   Modules accepted: Level of Service

## 2014-08-23 NOTE — Assessment & Plan Note (Signed)
Sugars at home mostly controlled with morning sugars around 100 but with several days of high sugars (200-300) with dietary indiscretions. At this time I think that it is more important that he have quality of life and she knows if his sugars do not come down to call the office for advice. Given his advanced dementia dietary compliance is difficult at best.

## 2014-08-23 NOTE — Assessment & Plan Note (Signed)
Still feel that dialysis is not an option in this case. Talked with his wife today about the fact that his kidneys will likely fail with time and that he will not be able to live without them.

## 2014-08-23 NOTE — Progress Notes (Signed)
Pre visit review using our clinic review tool, if applicable. No additional management support is needed unless otherwise documented below in the visit note. 

## 2014-08-23 NOTE — Assessment & Plan Note (Signed)
Continuing with home health and physical therapy. Using walker and doing okay. No falls since coming home.

## 2014-08-23 NOTE — Progress Notes (Signed)
   Subjective:    Patient ID: Casey Grimes, male    DOB: 06/09/1929, 81 y.o.   MRN: [id number]  HPI  The patient is an 81 YO man who comes in after being in the hospital and a rehab facility. His wife is with him and has several questions. No falls since being home, confusion about medicine with his wife. He is using walker and getting physical therapy. He does have fairly severe dementia and is not able to participate much in the conversation. Due to his co-morbidities the neurologist had discussed palliative care with them and his wife does not want to hear much about this and just wants to work on keeping him okay for as long as possible. She denies any new complaints. Denies hypogylcemia since leaving the rehab but some high sugars which are associated with what he eats.   Review of Systems  Constitutional: Negative for fever, chills, activity change, appetite change and fatigue.  Eyes: Negative.   Respiratory: Negative for cough, chest tightness, shortness of breath and wheezing.   Cardiovascular: Negative for chest pain, palpitations and leg swelling.  Gastrointestinal: Negative for abdominal pain, diarrhea, constipation and abdominal distention.  Musculoskeletal: Positive for back pain and gait problem.  Skin: Negative for wound.  Neurological: Negative for dizziness, syncope, weakness, light-headedness and headaches.  Psychiatric/Behavioral: Negative.       Objective:   Physical Exam  Constitutional: He is oriented to person, place, and time. He appears well-developed and well-nourished.  Speech slow but content intact  HENT:  Head: Normocephalic and atraumatic.  Eyes: EOM are normal.  Neck: Normal range of motion. Neck supple. No JVD present.  Cardiovascular: Normal rate and regular rhythm.   No murmur heard. Pulmonary/Chest: Effort normal and breath sounds normal. No respiratory distress. He has no wheezes. He has no rales. He exhibits no tenderness.  Abdominal: Soft. Bowel  sounds are normal. He exhibits no distension. There is no tenderness. There is no rebound.  Musculoskeletal: Normal range of motion. He exhibits no edema.  Neurological: He is alert and oriented to person, place, and time.  Patient walked with walker and no difficulty standing.   Skin: Skin is warm and dry.  Nursing note and vitals reviewed.  Filed Vitals:   08/23/14 1106  BP: 158/60  Pulse: 61  Temp: 97.7 F (36.5 C)  TempSrc: Oral  Resp: 14  Height: 5\' 11"  (1.803 m)  Weight: 215 lb 1.9 oz (97.578 kg)  SpO2: 97%      Assessment & Plan:  Time spent during the visit 25 minutes with greater than 50% of that spent in face to face counseling with the patient.

## 2014-09-03 ENCOUNTER — Other Ambulatory Visit: Payer: Self-pay | Admitting: Internal Medicine

## 2014-09-18 ENCOUNTER — Other Ambulatory Visit: Payer: Self-pay

## 2014-09-18 ENCOUNTER — Ambulatory Visit: Payer: Medicare Other | Admitting: Internal Medicine

## 2014-09-18 ENCOUNTER — Telehealth: Payer: Self-pay | Admitting: Internal Medicine

## 2014-09-18 ENCOUNTER — Observation Stay (HOSPITAL_COMMUNITY)
Admission: EM | Admit: 2014-09-18 | Discharge: 2014-09-19 | Disposition: A | Discharge status: Home / Self Care | Payer: Medicare Other | Attending: Internal Medicine | Admitting: Internal Medicine

## 2014-09-18 ENCOUNTER — Emergency Department (HOSPITAL_COMMUNITY): Payer: Medicare Other

## 2014-09-18 ENCOUNTER — Encounter (HOSPITAL_COMMUNITY): Payer: Self-pay | Admitting: Emergency Medicine

## 2014-09-18 DIAGNOSIS — I5032 Chronic diastolic (congestive) heart failure: Secondary | ICD-10-CM | POA: Insufficient documentation

## 2014-09-18 DIAGNOSIS — M1990 Unspecified osteoarthritis, unspecified site: Secondary | ICD-10-CM | POA: Insufficient documentation

## 2014-09-18 DIAGNOSIS — Z7982 Long term (current) use of aspirin: Secondary | ICD-10-CM | POA: Diagnosis not present

## 2014-09-18 DIAGNOSIS — N184 Chronic kidney disease, stage 4 (severe): Secondary | ICD-10-CM | POA: Insufficient documentation

## 2014-09-18 DIAGNOSIS — E785 Hyperlipidemia, unspecified: Secondary | ICD-10-CM | POA: Diagnosis not present

## 2014-09-18 DIAGNOSIS — I10 Essential (primary) hypertension: Secondary | ICD-10-CM

## 2014-09-18 DIAGNOSIS — Z8673 Personal history of transient ischemic attack (TIA), and cerebral infarction without residual deficits: Secondary | ICD-10-CM | POA: Diagnosis not present

## 2014-09-18 DIAGNOSIS — R079 Chest pain, unspecified: Principal | ICD-10-CM | POA: Insufficient documentation

## 2014-09-18 DIAGNOSIS — F329 Major depressive disorder, single episode, unspecified: Secondary | ICD-10-CM | POA: Insufficient documentation

## 2014-09-18 DIAGNOSIS — Z79899 Other long term (current) drug therapy: Secondary | ICD-10-CM | POA: Insufficient documentation

## 2014-09-18 DIAGNOSIS — E1121 Type 2 diabetes mellitus with diabetic nephropathy: Secondary | ICD-10-CM | POA: Insufficient documentation

## 2014-09-18 DIAGNOSIS — Z794 Long term (current) use of insulin: Secondary | ICD-10-CM | POA: Insufficient documentation

## 2014-09-18 DIAGNOSIS — N529 Male erectile dysfunction, unspecified: Secondary | ICD-10-CM | POA: Diagnosis not present

## 2014-09-18 DIAGNOSIS — I639 Cerebral infarction, unspecified: Secondary | ICD-10-CM

## 2014-09-18 DIAGNOSIS — M109 Gout, unspecified: Secondary | ICD-10-CM | POA: Diagnosis not present

## 2014-09-18 DIAGNOSIS — I129 Hypertensive chronic kidney disease with stage 1 through stage 4 chronic kidney disease, or unspecified chronic kidney disease: Secondary | ICD-10-CM | POA: Insufficient documentation

## 2014-09-18 DIAGNOSIS — Z87891 Personal history of nicotine dependence: Secondary | ICD-10-CM | POA: Insufficient documentation

## 2014-09-18 LAB — CBC WITH DIFFERENTIAL/PLATELET
BASOS PCT: 0 % (ref 0–1)
Basophils Absolute: 0 10*3/uL (ref 0.0–0.1)
EOS ABS: 0.4 10*3/uL (ref 0.0–0.7)
EOS PCT: 9 % — AB (ref 0–5)
HEMATOCRIT: 40.5 % (ref 39.0–52.0)
HEMOGLOBIN: 13.6 g/dL (ref 13.0–17.0)
Lymphocytes Relative: 30 % (ref 12–46)
Lymphs Abs: 1.4 10*3/uL (ref 0.7–4.0)
MCH: 28.6 pg (ref 26.0–34.0)
MCHC: 33.6 g/dL (ref 30.0–36.0)
MCV: 85.3 fL (ref 78.0–100.0)
MONOS PCT: 6 % (ref 3–12)
Monocytes Absolute: 0.3 10*3/uL (ref 0.1–1.0)
Neutro Abs: 2.6 10*3/uL (ref 1.7–7.7)
Neutrophils Relative %: 55 % (ref 43–77)
Platelets: 289 10*3/uL (ref 150–400)
RBC: 4.75 MIL/uL (ref 4.22–5.81)
RDW: 15.7 % — ABNORMAL HIGH (ref 11.5–15.5)
WBC: 4.8 10*3/uL (ref 4.0–10.5)

## 2014-09-18 LAB — I-STAT CHEM 8, ED
BUN: 54 mg/dL — ABNORMAL HIGH (ref 6–23)
CALCIUM ION: 1.22 mmol/L (ref 1.13–1.30)
CHLORIDE: 106 mmol/L (ref 96–112)
CREATININE: 2.7 mg/dL — AB (ref 0.50–1.35)
GLUCOSE: 259 mg/dL — AB (ref 70–99)
HEMATOCRIT: 46 % (ref 39.0–52.0)
Hemoglobin: 15.6 g/dL (ref 13.0–17.0)
Potassium: 3.8 mmol/L (ref 3.5–5.1)
Sodium: 141 mmol/L (ref 135–145)
TCO2: 20 mmol/L (ref 0–100)

## 2014-09-18 LAB — I-STAT TROPONIN, ED: Troponin i, poc: 0.01 ng/mL (ref 0.00–0.08)

## 2014-09-18 LAB — TROPONIN I: Troponin I: <0.03 ng/mL (ref <0.031)

## 2014-09-18 LAB — GLUCOSE, CAPILLARY: Glucose-Capillary: 200 mg/dL — ABNORMAL HIGH (ref 70–99)

## 2014-09-18 MED ORDER — ASPIRIN 81 MG PO CHEW: 324.0000 mg | CHEWABLE_TABLET | Freq: Once | ORAL | Status: DC

## 2014-09-18 MED ORDER — OXYCODONE HCL 5 MG PO TABS
5.0000 mg | ORAL_TABLET | ORAL | Status: DC | PRN
  Administered 2014-09-18: 5 mg via ORAL
  Filled 2014-09-18: qty 1

## 2014-09-18 MED ORDER — SODIUM CHLORIDE 0.9 % IJ SOLN
3.0000 mL | Freq: Two times a day (BID) | INTRAMUSCULAR | Status: DC
  Administered 2014-09-18 – 2014-09-19 (×2): 3 mL via INTRAVENOUS

## 2014-09-18 MED ORDER — INSULIN GLARGINE 100 UNIT/ML ~~LOC~~ SOLN
40.0000 [IU] | Freq: Every day | SUBCUTANEOUS | Status: DC
  Administered 2014-09-18: 40 [IU] via SUBCUTANEOUS
  Filled 2014-09-18 (×2): qty 0.4

## 2014-09-18 MED ORDER — CARVEDILOL 25 MG PO TABS
25.0000 mg | ORAL_TABLET | Freq: Two times a day (BID) | ORAL | Status: DC
  Administered 2014-09-19: 25 mg via ORAL
  Filled 2014-09-18 (×3): qty 1

## 2014-09-18 MED ORDER — LEVETIRACETAM 250 MG PO TABS
250.0000 mg | ORAL_TABLET | Freq: Two times a day (BID) | ORAL | Status: DC
  Administered 2014-09-18 – 2014-09-19 (×2): 250 mg via ORAL
  Filled 2014-09-18 (×3): qty 1

## 2014-09-18 MED ORDER — HYDROMORPHONE HCL 1 MG/ML IJ SOLN: 0.5000 mg | INTRAMUSCULAR | Status: DC | PRN

## 2014-09-18 MED ORDER — ACETAMINOPHEN 650 MG RE SUPP: 650.0000 mg | Freq: Four times a day (QID) | RECTAL | Status: DC | PRN

## 2014-09-18 MED ORDER — HEPARIN SODIUM (PORCINE) 5000 UNIT/ML IJ SOLN
5000.0000 [IU] | Freq: Three times a day (TID) | INTRAMUSCULAR | Status: DC
  Administered 2014-09-18 – 2014-09-19 (×2): 5000 [IU] via SUBCUTANEOUS
  Filled 2014-09-18 (×4): qty 1

## 2014-09-18 MED ORDER — SODIUM CHLORIDE 0.9 % IV SOLN: 250.0000 mL | INTRAVENOUS | Status: DC | PRN

## 2014-09-18 MED ORDER — CLONIDINE HCL 0.3 MG PO TABS
0.3000 mg | ORAL_TABLET | Freq: Three times a day (TID) | ORAL | Status: DC
  Administered 2014-09-18 – 2014-09-19 (×2): 0.3 mg via ORAL
  Filled 2014-09-18 (×4): qty 1

## 2014-09-18 MED ORDER — CALCITRIOL 0.25 MCG PO CAPS
0.2500 ug | ORAL_CAPSULE | Freq: Every day | ORAL | Status: DC
  Administered 2014-09-18 – 2014-09-19 (×2): 0.25 ug via ORAL
  Filled 2014-09-18 (×2): qty 1

## 2014-09-18 MED ORDER — ALUM & MAG HYDROXIDE-SIMETH 200-200-20 MG/5ML PO SUSP: 30.0000 mL | Freq: Four times a day (QID) | ORAL | Status: DC | PRN

## 2014-09-18 MED ORDER — FUROSEMIDE 20 MG PO TABS: 80.0000 mg | ORAL_TABLET | ORAL | Status: DC

## 2014-09-18 MED ORDER — ATORVASTATIN CALCIUM 10 MG PO TABS
10.0000 mg | ORAL_TABLET | Freq: Every day | ORAL | Status: DC
  Filled 2014-09-18: qty 1

## 2014-09-18 MED ORDER — ONDANSETRON HCL 4 MG PO TABS: 4.0000 mg | ORAL_TABLET | Freq: Four times a day (QID) | ORAL | Status: DC | PRN

## 2014-09-18 MED ORDER — ALLOPURINOL 150 MG HALF TABLET
150.0000 mg | ORAL_TABLET | Freq: Every day | ORAL | Status: DC
  Administered 2014-09-18 – 2014-09-19 (×2): 150 mg via ORAL
  Filled 2014-09-18 (×2): qty 1

## 2014-09-18 MED ORDER — SODIUM CHLORIDE 0.9 % IJ SOLN
3.0000 mL | Freq: Two times a day (BID) | INTRAMUSCULAR | Status: DC
  Administered 2014-09-19: 3 mL via INTRAVENOUS

## 2014-09-18 MED ORDER — "INSULIN SYRINGE-NEEDLE U-100 30G X 1/2"" 0.5 ML MISC": Status: DC

## 2014-09-18 MED ORDER — POTASSIUM CHLORIDE CRYS ER 20 MEQ PO TBCR
20.0000 meq | EXTENDED_RELEASE_TABLET | Freq: Two times a day (BID) | ORAL | Status: DC
  Administered 2014-09-18 – 2014-09-19 (×2): 20 meq via ORAL
  Filled 2014-09-18 (×3): qty 1

## 2014-09-18 MED ORDER — NITROGLYCERIN 2 % TD OINT
1.0000 [in_us] | TOPICAL_OINTMENT | Freq: Four times a day (QID) | TRANSDERMAL | Status: DC
  Administered 2014-09-18 – 2014-09-19 (×3): 1 [in_us] via TOPICAL
  Filled 2014-09-18 (×9): qty 30

## 2014-09-18 MED ORDER — ONDANSETRON HCL 4 MG/2ML IJ SOLN: 4.0000 mg | Freq: Four times a day (QID) | INTRAMUSCULAR | Status: DC | PRN

## 2014-09-18 MED ORDER — AMLODIPINE BESYLATE 10 MG PO TABS
10.0000 mg | ORAL_TABLET | Freq: Every day | ORAL | Status: DC
  Administered 2014-09-18 – 2014-09-19 (×2): 10 mg via ORAL
  Filled 2014-09-18 (×2): qty 1

## 2014-09-18 MED ORDER — SODIUM CHLORIDE 0.9 % IJ SOLN: 3.0000 mL | INTRAMUSCULAR | Status: DC | PRN

## 2014-09-18 MED ORDER — ASPIRIN EC 325 MG PO TBEC
325.0000 mg | DELAYED_RELEASE_TABLET | Freq: Every day | ORAL | Status: DC
  Administered 2014-09-19: 325 mg via ORAL
  Filled 2014-09-18 (×2): qty 1

## 2014-09-18 MED ORDER — ACETAMINOPHEN 325 MG PO TABS: 650.0000 mg | ORAL_TABLET | Freq: Four times a day (QID) | ORAL | Status: DC | PRN
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
## 2014-09-18 NOTE — ED Notes (Signed)
Rubin PayorPickering, MD notified of abnormal lab test results

## 2014-09-18 NOTE — Progress Notes (Signed)
Patient arrived from ED via stretcher. Patient alert, oriented to self/place and ambulatory with stand-by assist. Admission weight, vitals and assessment completed. Fall and safety plan reviewed with patient. Patient currently resting comfortably with call light within reach and bed alarm in use. Will continue to monitor. Blood pressure 161/65, pulse 57, temperature 97.6 F (36.4 C), resp. rate 16, SpO2 100 %. Troy SineWalker, [place] Craigo M

## 2014-09-18 NOTE — ED Notes (Signed)
Pt c/o left chest pain, onset this am.  Pt denies shortness of breath, diaphoresis, nausea or vomiting.

## 2014-09-18 NOTE — Telephone Encounter (Signed)
Pt wife called in and said that pt BP is very high every morning Between 7am and 7:30 it ranges 171-191. What should he do?   Best number  [phone]

## 2014-09-18 NOTE — Telephone Encounter (Signed)
Spoke to patient. They are going to schedule an office visit.

## 2014-09-19 ENCOUNTER — Encounter (HOSPITAL_COMMUNITY): Payer: Self-pay | Admitting: General Practice

## 2014-09-19 ENCOUNTER — Ambulatory Visit: Payer: Medicare Other | Admitting: Internal Medicine

## 2014-09-19 DIAGNOSIS — I209 Angina pectoris, unspecified: Secondary | ICD-10-CM

## 2014-09-19 LAB — CBC
HCT: 35.9 % — ABNORMAL LOW (ref 39.0–52.0)
HEMOGLOBIN: 11.7 g/dL — AB (ref 13.0–17.0)
MCH: 27.8 pg (ref 26.0–34.0)
MCHC: 32.6 g/dL (ref 30.0–36.0)
MCV: 85.3 fL (ref 78.0–100.0)
Platelets: 270 10*3/uL (ref 150–400)
RBC: 4.21 MIL/uL — ABNORMAL LOW (ref 4.22–5.81)
RDW: 15.8 % — AB (ref 11.5–15.5)
WBC: 4.4 10*3/uL (ref 4.0–10.5)

## 2014-09-19 LAB — BASIC METABOLIC PANEL
Anion gap: 8 (ref 5–15)
BUN: 57 mg/dL — AB (ref 6–23)
CALCIUM: 9 mg/dL (ref 8.4–10.5)
CO2: 24 mmol/L (ref 19–32)
Chloride: 107 mmol/L (ref 96–112)
Creatinine, Ser: 2.75 mg/dL — ABNORMAL HIGH (ref 0.50–1.35)
GFR calc Af Amer: 23 mL/min — ABNORMAL LOW (ref >90)
GFR, EST NON AFRICAN AMERICAN: 20 mL/min — AB (ref >90)
GLUCOSE: 166 mg/dL — AB (ref 70–99)
Potassium: 3.8 mmol/L (ref 3.5–5.1)
SODIUM: 139 mmol/L (ref 135–145)

## 2014-09-19 LAB — GLUCOSE, CAPILLARY
GLUCOSE-CAPILLARY: 123 mg/dL — AB (ref 70–99)
GLUCOSE-CAPILLARY: 184 mg/dL — AB (ref 70–99)

## 2014-09-19 LAB — TROPONIN I

## 2014-09-19 MED ORDER — INSULIN ASPART 100 UNIT/ML ~~LOC~~ SOLN: 0.0000 [IU] | Freq: Every day | SUBCUTANEOUS | Status: DC

## 2014-09-19 MED ORDER — INSULIN ASPART 100 UNIT/ML ~~LOC~~ SOLN
0.0000 [IU] | Freq: Three times a day (TID) | SUBCUTANEOUS | Status: DC
  Administered 2014-09-19: 1 [IU] via SUBCUTANEOUS
  Administered 2014-09-19: 2 [IU] via SUBCUTANEOUS

## 2014-09-19 MED ORDER — ENSURE COMPLETE PO LIQD
237.0000 mL | Freq: Two times a day (BID) | ORAL | Status: DC
  Administered 2014-09-19: 237 mL via ORAL

## 2014-09-19 NOTE — Progress Notes (Signed)
UR completed 
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
## 2014-09-19 NOTE — Progress Notes (Signed)
Discharge education completed by RN. Pt, son and spouse received a copy of discharge paperwork and confirm understanding of follow up appointments and discharge medications. All deny any questions at this time. IV removed, site is within normal limits. Pt will discharge from the unit via wheelchair.

## 2014-09-19 NOTE — Discharge Instructions (Signed)
Follow with Primary MD Judie BonusKollar, Elizabeth A, MD in 7 days   Get CBC, CMP, 2 view Chest X ray checked  by Primary MD next visit.    Activity: As tolerated with Full fall precautions use walker/cane & assistance as needed   Disposition Home    Diet: Heart Healthy  , with feeding assistance and aspiration precautions as needed.  For Heart failure patients - Check your Weight same time everyday, if you gain over 2 pounds, or you develop in leg swelling, experience more shortness of breath or chest pain, call your Primary MD immediately. Follow Cardiac Low Salt Diet and 1.5 lit/day fluid restriction.   On your next visit with your primary care physician please Get Medicines reviewed and adjusted.   Please request your Prim.MD to go over all Hospital Tests and Procedure/Radiological results at the follow up, please get all Hospital records sent to your Prim MD by signing hospital release before you go home.   If you experience worsening of your admission symptoms, develop shortness of breath, life threatening emergency, suicidal or homicidal thoughts you must seek medical attention immediately by calling 911 or calling your MD immediately  if symptoms less severe.  You Must read complete instructions/literature along with all the possible adverse reactions/side effects for all the Medicines you take and that have been prescribed to you. Take any new Medicines after you have completely understood and accpet all the possible adverse reactions/side effects.   Do not drive, operating heavy machinery, perform activities at heights, swimming or participation in water activities or provide baby sitting services if your were admitted for syncope or siezures until you have seen by Primary MD or a Neurologist and advised to do so again.  Do not drive when taking Pain medications.    Do not take more than prescribed Pain, Sleep and Anxiety Medications  Special Instructions: If you have smoked or  chewed Tobacco  in the last 2 yrs please stop smoking, stop any regular Alcohol  and or any Recreational drug use.  Wear Seat belts while driving.   Please note  You were cared for by a hospitalist during your hospital stay. If you have any questions about your discharge medications or the care you received while you were in the hospital after you are discharged, you can call the unit and asked to speak with the hospitalist on call if the hospitalist that took care of you is not available. Once you are discharged, your primary care physician will handle any further medical issues. Please note that NO REFILLS for any discharge medications will be authorized once you are discharged, as it is imperative that you return to your primary care physician (or establish a relationship with a primary care physician if you do not have one) for your aftercare needs so that they can reassess your need for medications and monitor your lab values.

## 2014-09-20 ENCOUNTER — Telehealth: Payer: Self-pay | Admitting: *Deleted

## 2014-09-20 LAB — HEMOGLOBIN A1C
Hgb A1c MFr Bld: 8.8 % — ABNORMAL HIGH (ref 4.8–5.6)
MEAN PLASMA GLUCOSE: 206 mg/dL

## 2014-09-20 NOTE — Telephone Encounter (Signed)
Transition Care Management Follow-up Telephone Call D/C 09/19/14  How have you been since you were released from the hospital? Pt states he is doing fairly well   Do you understand why you were in the hospital? YES   Do you understand the discharge instrcutions? YES  Items Reviewed:  Medications reviewed: YES  Allergies reviewed: YES  Dietary changes reviewed: YES  Referrals reviewed: No referrals needed   Functional Questionnaire:   Activities of Daily Living (ADLs):   He states he is independent in the followiambulation, bathing and hygiene, feeding, continence, grooming, toileting and dressing States he doesn't require assisitance   Any transportation issues/concerns?: NO   Any patient concerns? NO   Confirmed importance and date/time of follow-up visits scheduled: YES, pt stated they had made appt for 09/26/14 w/Dr. Dorise HissKollar advise pt to make sure he keep appt   Confirmed with patient if condition begins to worsen call PCP or go to the ER.

## 2014-09-26 ENCOUNTER — Encounter: Payer: Self-pay | Admitting: Internal Medicine

## 2014-09-26 ENCOUNTER — Other Ambulatory Visit (INDEPENDENT_AMBULATORY_CARE_PROVIDER_SITE_OTHER): Payer: Medicare Other

## 2014-09-26 ENCOUNTER — Ambulatory Visit (INDEPENDENT_AMBULATORY_CARE_PROVIDER_SITE_OTHER): Payer: Medicare Other | Admitting: Internal Medicine

## 2014-09-26 VITALS — BP 162/78 | HR 70 | Temp 98.3°F | Resp 18 | Ht 71.0 in | Wt 217.0 lb

## 2014-09-26 DIAGNOSIS — I10 Essential (primary) hypertension: Secondary | ICD-10-CM

## 2014-09-26 DIAGNOSIS — N184 Chronic kidney disease, stage 4 (severe): Secondary | ICD-10-CM

## 2014-09-26 LAB — BASIC METABOLIC PANEL
BUN: 62 mg/dL — AB (ref 6–23)
CO2: 27 mEq/L (ref 19–32)
Calcium: 10.2 mg/dL (ref 8.4–10.5)
Chloride: 104 mEq/L (ref 96–112)
Creatinine, Ser: 2.74 mg/dL — ABNORMAL HIGH (ref 0.40–1.50)
GFR: 28.6 mL/min — ABNORMAL LOW (ref >60.00)
GLUCOSE: 186 mg/dL — AB (ref 70–99)
Potassium: 3.8 mEq/L (ref 3.5–5.1)
SODIUM: 137 meq/L (ref 135–145)

## 2014-09-26 LAB — CBC
HEMATOCRIT: 42.3 % (ref 39.0–52.0)
Hemoglobin: 14.3 g/dL (ref 13.0–17.0)
MCHC: 33.7 g/dL (ref 30.0–36.0)
MCV: 83.8 fl (ref 78.0–100.0)
Platelets: 326 10*3/uL (ref 150.0–400.0)
RBC: 5.05 Mil/uL (ref 4.22–5.81)
RDW: 17 % — ABNORMAL HIGH (ref 11.5–15.5)
WBC: 6.2 10*3/uL (ref 4.0–10.5)

## 2014-09-26 MED ORDER — HYDRALAZINE HCL 25 MG PO TABS
25.0000 mg | ORAL_TABLET | Freq: Three times a day (TID) | ORAL | Status: DC
Start: 2014-09-26 — End: 2014-12-06

## 2014-09-26 MED ORDER — CLONIDINE HCL 0.3 MG/24HR TD PTWK: 0.3000 mg | MEDICATED_PATCH | TRANSDERMAL | Status: DC

## 2014-09-26 NOTE — Assessment & Plan Note (Signed)
Check BMP today and forward to [facility]. He does have apt with them on 24th of this month. Added hydralazine for better control of BP to help with progression of his CKD. Not a good candidate for dialysis.

## 2014-09-26 NOTE — Progress Notes (Signed)
   Subjective:    Patient ID: Casey Grimes, male    DOB: 08/21/1928, 81 y.o.   MRN: [id number]  HPI The patient is an 81 YO man who is coming in today for hospital follow up (in hospital for chest pain, ruled out for MI, needs outpatient stress test). He has not had chest pains since leaving the hospital. His blood pressure was elevated in the hospital and has been elevated since then at home. His wife is with him and helps to provide most of the information. He denies any new symptoms. He is taking his medications as prescribed.   Review of Systems  Constitutional: Negative for fever, chills, activity change, appetite change and fatigue.  Eyes: Negative.   Respiratory: Negative for cough, chest tightness, shortness of breath and wheezing.   Cardiovascular: Negative for chest pain, palpitations and leg swelling.  Gastrointestinal: Negative for abdominal pain, diarrhea, constipation and abdominal distention.  Musculoskeletal: Positive for gait problem.  Skin: Negative for wound.  Neurological: Negative for dizziness, syncope, weakness, light-headedness and headaches.  Psychiatric/Behavioral: Negative.       Objective:   Physical Exam  Constitutional: He is oriented to person, place, and time. He appears well-developed and well-nourished.  Speech slow but content intact  HENT:  Head: Normocephalic and atraumatic.  Eyes: EOM are normal.  Neck: Normal range of motion. Neck supple. No JVD present.  Cardiovascular: Normal rate and regular rhythm.   No murmur heard. Pulmonary/Chest: Effort normal and breath sounds normal. No respiratory distress. He has no wheezes. He has no rales. He exhibits no tenderness.  Abdominal: Soft. Bowel sounds are normal. He exhibits no distension. There is no tenderness. There is no rebound.  Musculoskeletal: Normal range of motion. He exhibits no edema.  Neurological: He is alert and oriented to person, place, and time.  Patient walked with walker and slow  with standing.   Skin: Skin is warm and dry.  Nursing note and vitals reviewed.  Filed Vitals:   09/26/14 1103  BP: 162/78  Pulse: 70  Temp: 98.3 F (36.8 C)  TempSrc: Oral  Resp: 18  Height: 5\' 11"  (1.803 m)  Weight: 217 lb (98.431 kg)  SpO2: 97%      Assessment & Plan:

## 2014-09-26 NOTE — Progress Notes (Signed)
Pre visit review using our clinic review tool, if applicable. No additional management support is needed unless otherwise documented below in the visit note. 

## 2014-09-26 NOTE — Patient Instructions (Addendum)
We can stop the keppra which he has not been taking. If he ever has any episodes again that we are worried about we can always start it again.   We have added a new medicine for the blood pressure which is called hydralazine. You take 1 pill 3 times a day for the blood pressure.   We are going to change his clonidine (right now 3 times a day) to a patch that you change once a week. When you run out of pills (or sooner if you want) you can stop taking the pills and use the new patch.

## 2014-09-26 NOTE — Assessment & Plan Note (Signed)
Needs increase in his regimen. Will change clonidine to patch for simplicity. Continue norvasc 10 mg daily, coreg 25 mg bid, lasix bid. Add hydralazine 25 mg TID. He does have CKD stage 4 and would like to prevent progression to prolong his life. Would not recommend dialysis for him.

## 2014-09-30 ENCOUNTER — Telehealth: Payer: Self-pay | Admitting: Internal Medicine

## 2014-09-30 NOTE — Telephone Encounter (Signed)
Pt wife called in and wanted to know if pt one touch test strips can be called in to [facility] on [address]

## 2014-10-01 ENCOUNTER — Other Ambulatory Visit: Payer: Self-pay | Admitting: Geriatric Medicine

## 2014-10-01 MED ORDER — GLUCOSE BLOOD VI STRP: ORAL_STRIP | Status: DC

## 2014-10-01 NOTE — Telephone Encounter (Signed)
Sent to pharmacy 

## 2014-10-02 ENCOUNTER — Other Ambulatory Visit: Payer: Self-pay | Admitting: Geriatric Medicine

## 2014-10-02 MED ORDER — CALCITRIOL 0.25 MCG PO CAPS: 0.2500 ug | ORAL_CAPSULE | Freq: Every day | ORAL | Status: DC

## 2014-10-02 NOTE — Telephone Encounter (Signed)
Requesting a refill of calcitRIOL (ROCALTROL) 0.25 MCG capsule [[id number]][[id number]] [facility] [address]

## 2014-10-03 ENCOUNTER — Other Ambulatory Visit: Payer: Self-pay | Admitting: Internal Medicine

## 2014-10-21 ENCOUNTER — Telehealth: Payer: Self-pay | Admitting: Internal Medicine

## 2014-10-21 ENCOUNTER — Telehealth: Payer: Self-pay

## 2014-10-21 DIAGNOSIS — R0789 Other chest pain: Secondary | ICD-10-CM

## 2014-10-21 NOTE — Telephone Encounter (Signed)
Left message that dr Dorise Hisskollar has sent referral to cardiology

## 2014-10-21 NOTE — Telephone Encounter (Signed)
Please advise, thanks.

## 2014-10-21 NOTE — Telephone Encounter (Signed)
Done

## 2014-10-21 NOTE — Addendum Note (Signed)
Addended by: Genella MechKOLLAR, ELIZABETH A on: 10/21/2014 02:33 PM   Modules accepted: Orders

## 2014-10-21 NOTE — Telephone Encounter (Signed)
Patient was in the hospital March 2nd.  The hospital told him that they would refer him to heart care for a stress test.  Patient called Cardiology and they did not have any referrals from the hospital.  Spouse would like to know if Dr. Dorise HissKollar can refer patient.

## 2014-10-26 IMAGING — CR DG CHEST 1V PORT
1 series · 1 of 1 positions shown · non-contrast
Comparison: 02/22/2007

CLINICAL DATA: Shortness of breath

EXAM:
PORTABLE CHEST - 1 VIEW

[AP]
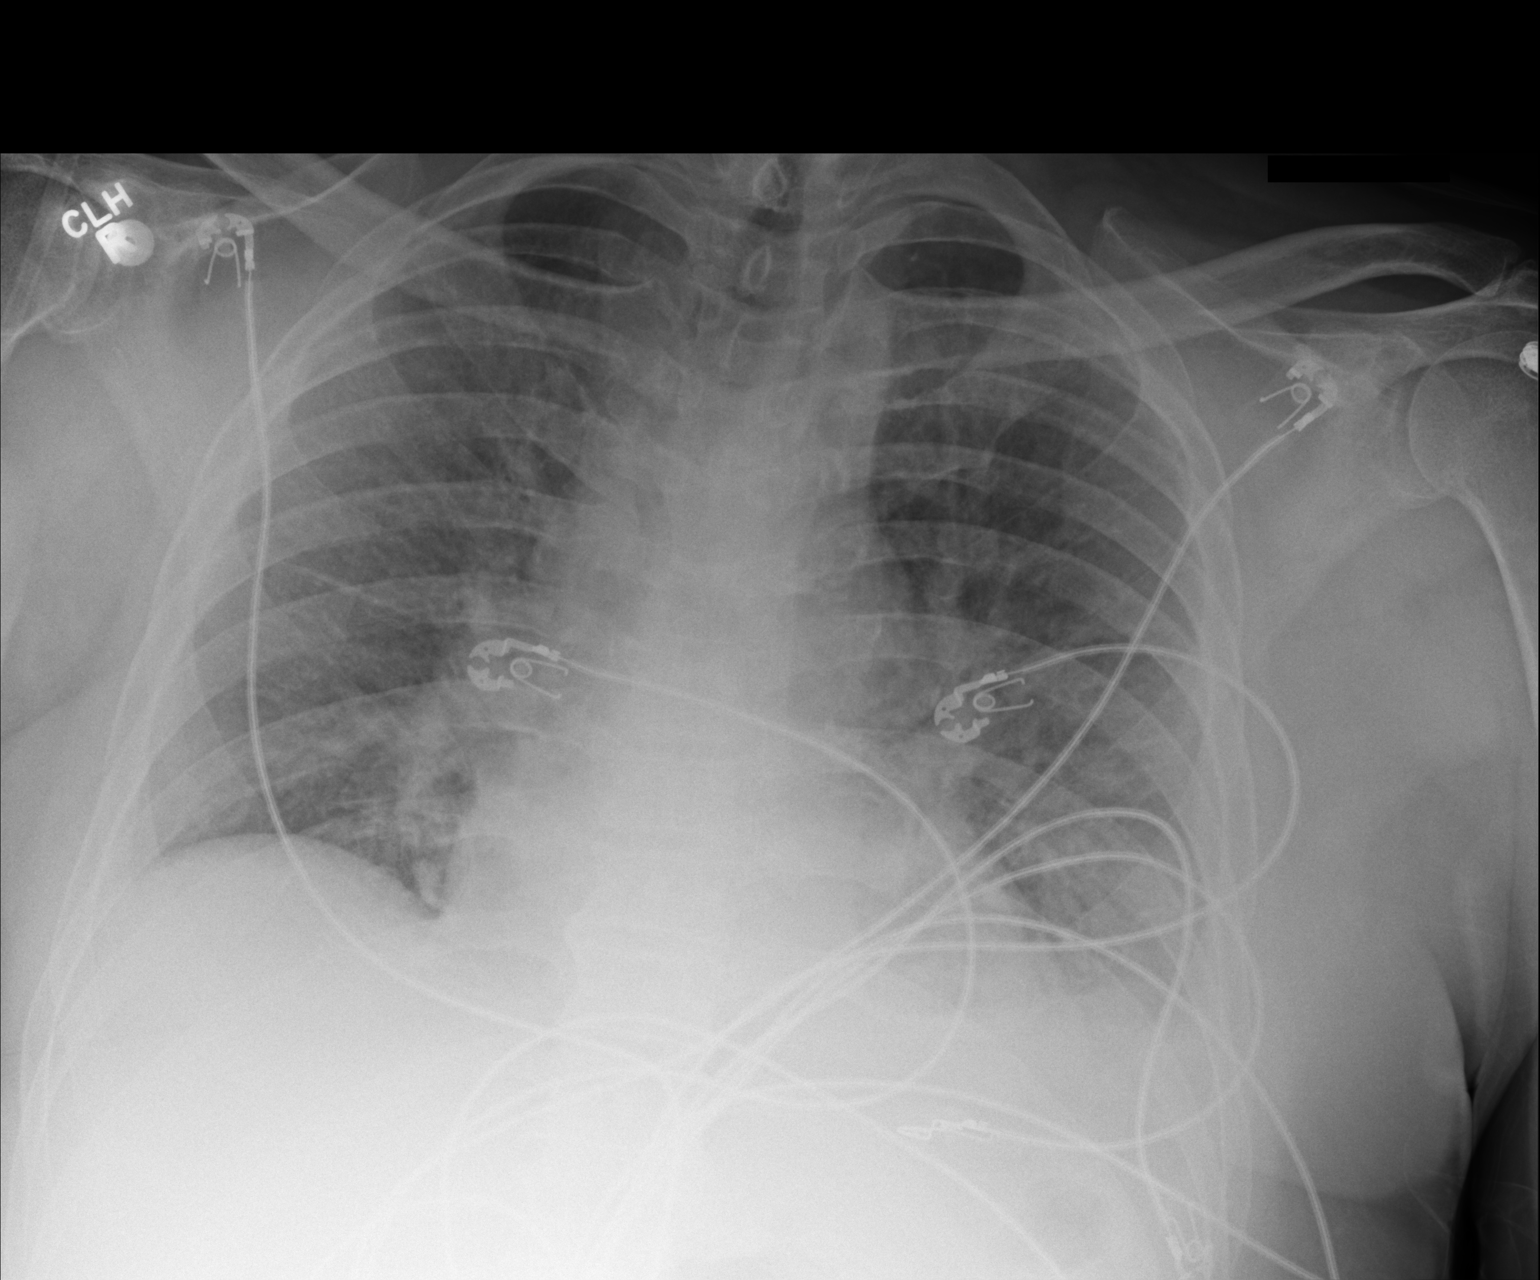

[1 of 1 positions shown; findings below may reference images not displayed]

FINDINGS: Diffuse interstitial coarsening with cephalized blood flow. Vascular
pedicle widening. No cardiomegaly. Possible small effusions. No
pneumothorax.
IMPRESSION: Pulmonary edema.

## 2014-11-08 ENCOUNTER — Telehealth: Payer: Self-pay | Admitting: Internal Medicine

## 2014-11-08 MED ORDER — ATORVASTATIN CALCIUM 10 MG PO TABS: ORAL_TABLET | ORAL | Status: DC

## 2014-11-08 NOTE — Telephone Encounter (Signed)
[facility],/lmb

## 2014-11-08 NOTE — Telephone Encounter (Signed)
Patient needs refill for atorvastatin (LIPITOR) 10 MG tablet [[id number]. [facility] [address]

## 2014-11-22 ENCOUNTER — Telehealth: Payer: Self-pay | Admitting: Internal Medicine

## 2014-11-22 ENCOUNTER — Observation Stay (HOSPITAL_COMMUNITY)
Admission: EM | Admit: 2014-11-22 | Discharge: 2014-11-24 | Disposition: A | Discharge status: Home / Self Care | Payer: Medicare Other | Attending: Internal Medicine | Admitting: Internal Medicine

## 2014-11-22 ENCOUNTER — Encounter (HOSPITAL_COMMUNITY): Payer: Self-pay | Admitting: Nurse Practitioner

## 2014-11-22 ENCOUNTER — Emergency Department (HOSPITAL_COMMUNITY): Payer: Medicare Other

## 2014-11-22 DIAGNOSIS — M6282 Rhabdomyolysis: Secondary | ICD-10-CM

## 2014-11-22 DIAGNOSIS — R7989 Other specified abnormal findings of blood chemistry: Secondary | ICD-10-CM | POA: Diagnosis not present

## 2014-11-22 DIAGNOSIS — Z87891 Personal history of nicotine dependence: Secondary | ICD-10-CM | POA: Insufficient documentation

## 2014-11-22 DIAGNOSIS — F329 Major depressive disorder, single episode, unspecified: Secondary | ICD-10-CM | POA: Insufficient documentation

## 2014-11-22 DIAGNOSIS — R251 Tremor, unspecified: Secondary | ICD-10-CM | POA: Diagnosis present

## 2014-11-22 DIAGNOSIS — M109 Gout, unspecified: Secondary | ICD-10-CM | POA: Diagnosis not present

## 2014-11-22 DIAGNOSIS — N179 Acute kidney failure, unspecified: Secondary | ICD-10-CM | POA: Diagnosis not present

## 2014-11-22 DIAGNOSIS — Z79899 Other long term (current) drug therapy: Secondary | ICD-10-CM | POA: Diagnosis not present

## 2014-11-22 DIAGNOSIS — I129 Hypertensive chronic kidney disease with stage 1 through stage 4 chronic kidney disease, or unspecified chronic kidney disease: Secondary | ICD-10-CM | POA: Diagnosis not present

## 2014-11-22 DIAGNOSIS — E785 Hyperlipidemia, unspecified: Secondary | ICD-10-CM | POA: Insufficient documentation

## 2014-11-22 DIAGNOSIS — R748 Abnormal levels of other serum enzymes: Secondary | ICD-10-CM

## 2014-11-22 DIAGNOSIS — E1321 Other specified diabetes mellitus with diabetic nephropathy: Secondary | ICD-10-CM

## 2014-11-22 DIAGNOSIS — N184 Chronic kidney disease, stage 4 (severe): Secondary | ICD-10-CM | POA: Diagnosis not present

## 2014-11-22 DIAGNOSIS — G253 Myoclonus: Secondary | ICD-10-CM | POA: Diagnosis present

## 2014-11-22 DIAGNOSIS — G8929 Other chronic pain: Secondary | ICD-10-CM | POA: Diagnosis not present

## 2014-11-22 DIAGNOSIS — Z7982 Long term (current) use of aspirin: Secondary | ICD-10-CM | POA: Diagnosis not present

## 2014-11-22 DIAGNOSIS — IMO0001 Reserved for inherently not codable concepts without codable children: Secondary | ICD-10-CM

## 2014-11-22 DIAGNOSIS — E1121 Type 2 diabetes mellitus with diabetic nephropathy: Secondary | ICD-10-CM | POA: Diagnosis present

## 2014-11-22 DIAGNOSIS — F0390 Unspecified dementia without behavioral disturbance: Secondary | ICD-10-CM | POA: Diagnosis not present

## 2014-11-22 DIAGNOSIS — Z8673 Personal history of transient ischemic attack (TIA), and cerebral infarction without residual deficits: Secondary | ICD-10-CM | POA: Insufficient documentation

## 2014-11-22 DIAGNOSIS — M1990 Unspecified osteoarthritis, unspecified site: Secondary | ICD-10-CM | POA: Insufficient documentation

## 2014-11-22 DIAGNOSIS — R6889 Other general symptoms and signs: Secondary | ICD-10-CM | POA: Diagnosis not present

## 2014-11-22 DIAGNOSIS — I10 Essential (primary) hypertension: Secondary | ICD-10-CM | POA: Diagnosis present

## 2014-11-22 DIAGNOSIS — Z794 Long term (current) use of insulin: Secondary | ICD-10-CM | POA: Insufficient documentation

## 2014-11-22 DIAGNOSIS — E1140 Type 2 diabetes mellitus with diabetic neuropathy, unspecified: Secondary | ICD-10-CM | POA: Diagnosis not present

## 2014-11-22 HISTORY — DX: Unspecified dementia, unspecified severity, without behavioral disturbance, psychotic disturbance, mood disturbance, and anxiety: F03.90

## 2014-11-22 LAB — BASIC METABOLIC PANEL
ANION GAP: 12 (ref 5–15)
BUN: 68 mg/dL — ABNORMAL HIGH (ref 6–20)
CALCIUM: 9.8 mg/dL (ref 8.9–10.3)
CO2: 22 mmol/L (ref 22–32)
Chloride: 106 mmol/L (ref 101–111)
Creatinine, Ser: 3.63 mg/dL — ABNORMAL HIGH (ref 0.61–1.24)
GFR calc Af Amer: 16 mL/min — ABNORMAL LOW (ref >60)
GFR, EST NON AFRICAN AMERICAN: 14 mL/min — AB (ref >60)
Glucose, Bld: 184 mg/dL — ABNORMAL HIGH (ref 70–99)
Potassium: 4.2 mmol/L (ref 3.5–5.1)
Sodium: 140 mmol/L (ref 135–145)

## 2014-11-22 LAB — CBC
HEMATOCRIT: 40.2 % (ref 39.0–52.0)
HEMOGLOBIN: 13.6 g/dL (ref 13.0–17.0)
MCH: 28 pg (ref 26.0–34.0)
MCHC: 33.8 g/dL (ref 30.0–36.0)
MCV: 82.9 fL (ref 78.0–100.0)
Platelets: 264 10*3/uL (ref 150–400)
RBC: 4.85 MIL/uL (ref 4.22–5.81)
RDW: 15.2 % (ref 11.5–15.5)
WBC: 6.3 10*3/uL (ref 4.0–10.5)

## 2014-11-22 LAB — URINE MICROSCOPIC-ADD ON

## 2014-11-22 LAB — URINALYSIS, ROUTINE W REFLEX MICROSCOPIC
Bilirubin Urine: NEGATIVE
Glucose, UA: NEGATIVE mg/dL
Ketones, ur: NEGATIVE mg/dL
Leukocytes, UA: NEGATIVE
Nitrite: NEGATIVE
Protein, ur: >300 mg/dL — AB
Specific Gravity, Urine: 1.011 (ref 1.005–1.030)
Urobilinogen, UA: 0.2 mg/dL (ref 0.0–1.0)
pH: 5 (ref 5.0–8.0)

## 2014-11-22 LAB — CK: Total CK: 947 U/L — ABNORMAL HIGH (ref 49–397)

## 2014-11-22 LAB — LACTIC ACID, PLASMA
LACTIC ACID, VENOUS: 1.5 mmol/L (ref 0.5–2.0)
Lactic Acid, Venous: 1.2 mmol/L (ref 0.5–2.0)

## 2014-11-22 LAB — MAGNESIUM: Magnesium: 2.5 mg/dL — ABNORMAL HIGH (ref 1.7–2.4)

## 2014-11-22 MED ORDER — AMLODIPINE BESYLATE 10 MG PO TABS
10.0000 mg | ORAL_TABLET | Freq: Every day | ORAL | Status: DC
  Administered 2014-11-23 – 2014-11-24 (×2): 10 mg via ORAL
  Filled 2014-11-22 (×2): qty 1

## 2014-11-22 MED ORDER — HEPARIN SODIUM (PORCINE) 5000 UNIT/ML IJ SOLN
5000.0000 [IU] | Freq: Three times a day (TID) | INTRAMUSCULAR | Status: DC
  Administered 2014-11-23 – 2014-11-24 (×5): 5000 [IU] via SUBCUTANEOUS
  Filled 2014-11-22 (×6): qty 1

## 2014-11-22 MED ORDER — SODIUM CHLORIDE 0.9 % IV SOLN: INTRAVENOUS | Status: DC

## 2014-11-22 MED ORDER — CARVEDILOL 25 MG PO TABS
25.0000 mg | ORAL_TABLET | Freq: Two times a day (BID) | ORAL | Status: DC
  Administered 2014-11-23 – 2014-11-24 (×4): 25 mg via ORAL
  Filled 2014-11-22 (×5): qty 1

## 2014-11-22 MED ORDER — GABAPENTIN 300 MG PO CAPS: 300.0000 mg | ORAL_CAPSULE | Freq: Three times a day (TID) | ORAL | Status: DC

## 2014-11-22 MED ORDER — CALCITRIOL 0.25 MCG PO CAPS
0.2500 ug | ORAL_CAPSULE | Freq: Every day | ORAL | Status: DC
  Administered 2014-11-23 – 2014-11-24 (×2): 0.25 ug via ORAL
  Filled 2014-11-22 (×3): qty 1

## 2014-11-22 MED ORDER — HYDRALAZINE HCL 25 MG PO TABS
25.0000 mg | ORAL_TABLET | Freq: Three times a day (TID) | ORAL | Status: DC
  Administered 2014-11-23 – 2014-11-24 (×2): 25 mg via ORAL
  Filled 2014-11-22 (×4): qty 1

## 2014-11-22 MED ORDER — FUROSEMIDE 80 MG PO TABS
80.0000 mg | ORAL_TABLET | Freq: Four times a day (QID) | ORAL | Status: DC
  Administered 2014-11-23 (×2): 80 mg via ORAL
  Filled 2014-11-22 (×5): qty 1

## 2014-11-22 MED ORDER — FUROSEMIDE 20 MG PO TABS: 80.0000 mg | ORAL_TABLET | Freq: Four times a day (QID) | ORAL | Status: DC

## 2014-11-22 MED ORDER — SODIUM CHLORIDE 0.9 % IV SOLN
INTRAVENOUS | Status: DC
  Administered 2014-11-22: 21:00:00 via INTRAVENOUS

## 2014-11-22 MED ORDER — POTASSIUM CHLORIDE CRYS ER 20 MEQ PO TBCR
20.0000 meq | EXTENDED_RELEASE_TABLET | Freq: Two times a day (BID) | ORAL | Status: DC
  Administered 2014-11-23 – 2014-11-24 (×4): 20 meq via ORAL
  Filled 2014-11-22 (×5): qty 1

## 2014-11-22 MED ORDER — ALLOPURINOL 150 MG HALF TABLET
150.0000 mg | ORAL_TABLET | Freq: Every day | ORAL | Status: DC
  Administered 2014-11-23 – 2014-11-24 (×2): 150 mg via ORAL
  Filled 2014-11-22 (×2): qty 1

## 2014-11-22 MED ORDER — INSULIN ASPART 100 UNIT/ML ~~LOC~~ SOLN
10.0000 [IU] | Freq: Three times a day (TID) | SUBCUTANEOUS | Status: DC
  Administered 2014-11-23 – 2014-11-24 (×4): 10 [IU] via SUBCUTANEOUS

## 2014-11-22 MED ORDER — INSULIN ASPART 100 UNIT/ML ~~LOC~~ SOLN
0.0000 [IU] | Freq: Three times a day (TID) | SUBCUTANEOUS | Status: DC
  Administered 2014-11-23: 2 [IU] via SUBCUTANEOUS
  Administered 2014-11-23 (×2): 3 [IU] via SUBCUTANEOUS
  Administered 2014-11-24: 2 [IU] via SUBCUTANEOUS

## 2014-11-22 MED ORDER — ASPIRIN EC 325 MG PO TBEC
325.0000 mg | DELAYED_RELEASE_TABLET | Freq: Every day | ORAL | Status: DC
  Administered 2014-11-23 – 2014-11-24 (×2): 325 mg via ORAL
  Filled 2014-11-22 (×4): qty 1

## 2014-11-22 MED ORDER — INSULIN GLARGINE 100 UNIT/ML ~~LOC~~ SOLN
40.0000 [IU] | Freq: Every day | SUBCUTANEOUS | Status: DC
  Administered 2014-11-23 (×2): 40 [IU] via SUBCUTANEOUS
  Filled 2014-11-22 (×3): qty 0.4
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
## 2014-11-22 NOTE — Progress Notes (Signed)
Attempted to obtain report

## 2014-11-22 NOTE — ED Notes (Signed)
Pt ambulated with this RN independently and with a steady gait, denies dizziness/

## 2014-11-22 NOTE — ED Notes (Signed)
[facility] [name] Filla [phone]

## 2014-11-22 NOTE — ED Notes (Signed)
Dr. Gardner at bedside 

## 2014-11-22 NOTE — Telephone Encounter (Signed)
[facility] - Client TELEPHONE ADVICE RECORD [facility]  Patient Name: Casey Grimes  DOB: 03/25/1929    Initial Comment Caller states that he is not feeling well and is trembling bp is 196/97 pulse 98    Nurse Assessment  Nurse: Laural BenesJohnson, RN, Dondra SpryGail Date/Time (Eastern Time): 11/22/2014 3:55:01 PM  Confirm and document reason for call. If symptomatic, describe symptoms. ---Casey Grimes states his back hurts 196/97 98 trembling is urinating ok and drinking plenty of fluids blood sugar is high at 215  Has the patient traveled out of the country within the last 30 days? ---No  Does the patient require triage? ---Yes  Related visit to physician within the last 2 weeks? ---No  Does the PT have any chronic conditions? (i.e. diabetes, asthma, etc.) ---Yes  List chronic conditions. ---Diabetes HTN Kidney failure     Guidelines    Guideline Title Affirmed Question Affirmed Notes  High Blood Pressure BP ? 160/100    Final Disposition User   Go to ED Now Laural BenesJohnson, RN, Dondra SpryGail
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
## 2014-11-22 NOTE — ED Notes (Signed)
Family states his BP was 196/97 this afternoon. He has taken his BP medication as scheduled today. He states he just doesn't feel very well today and his back hurts but does have chronic back pain. He is alert, breathing easily

## 2014-11-23 DIAGNOSIS — E0821 Diabetes mellitus due to underlying condition with diabetic nephropathy: Secondary | ICD-10-CM

## 2014-11-23 DIAGNOSIS — E1121 Type 2 diabetes mellitus with diabetic nephropathy: Secondary | ICD-10-CM | POA: Diagnosis not present

## 2014-11-23 DIAGNOSIS — N184 Chronic kidney disease, stage 4 (severe): Secondary | ICD-10-CM | POA: Diagnosis not present

## 2014-11-23 DIAGNOSIS — R6889 Other general symptoms and signs: Secondary | ICD-10-CM | POA: Diagnosis not present

## 2014-11-23 DIAGNOSIS — G253 Myoclonus: Secondary | ICD-10-CM | POA: Diagnosis not present

## 2014-11-23 LAB — BASIC METABOLIC PANEL
Anion gap: 9 (ref 5–15)
BUN: 69 mg/dL — ABNORMAL HIGH (ref 6–20)
CHLORIDE: 107 mmol/L (ref 101–111)
CO2: 23 mmol/L (ref 22–32)
Calcium: 9.2 mg/dL (ref 8.9–10.3)
Creatinine, Ser: 3.51 mg/dL — ABNORMAL HIGH (ref 0.61–1.24)
GFR calc Af Amer: 17 mL/min — ABNORMAL LOW (ref >60)
GFR calc non Af Amer: 15 mL/min — ABNORMAL LOW (ref >60)
GLUCOSE: 193 mg/dL — AB (ref 70–99)
Potassium: 4.1 mmol/L (ref 3.5–5.1)
SODIUM: 139 mmol/L (ref 135–145)

## 2014-11-23 LAB — CBC
HCT: 37.8 % — ABNORMAL LOW (ref 39.0–52.0)
HEMOGLOBIN: 12.4 g/dL — AB (ref 13.0–17.0)
MCH: 27.9 pg (ref 26.0–34.0)
MCHC: 32.8 g/dL (ref 30.0–36.0)
MCV: 84.9 fL (ref 78.0–100.0)
PLATELETS: 242 10*3/uL (ref 150–400)
RBC: 4.45 MIL/uL (ref 4.22–5.81)
RDW: 15.6 % — AB (ref 11.5–15.5)
WBC: 6.1 10*3/uL (ref 4.0–10.5)

## 2014-11-23 LAB — GLUCOSE, CAPILLARY
Glucose-Capillary: 196 mg/dL — ABNORMAL HIGH (ref 70–99)
Glucose-Capillary: 241 mg/dL — ABNORMAL HIGH (ref 70–99)
Glucose-Capillary: 219 mg/dL — ABNORMAL HIGH (ref 70–99)
Glucose-Capillary: 74 mg/dL (ref 70–99)

## 2014-11-23 LAB — URINE CULTURE
CULTURE: NO GROWTH
Colony Count: NO GROWTH

## 2014-11-23 LAB — CK: Total CK: 638 U/L — ABNORMAL HIGH (ref 49–397)

## 2014-11-23 MED ORDER — FUROSEMIDE 80 MG PO TABS
160.0000 mg | ORAL_TABLET | Freq: Two times a day (BID) | ORAL | Status: DC
  Administered 2014-11-23 – 2014-11-24 (×2): 160 mg via ORAL
  Filled 2014-11-23 (×4): qty 2

## 2014-11-23 MED ORDER — CLONIDINE HCL 0.2 MG PO TABS
0.2000 mg | ORAL_TABLET | Freq: Two times a day (BID) | ORAL | Status: DC
  Administered 2014-11-23 – 2014-11-24 (×3): 0.2 mg via ORAL
  Filled 2014-11-23 (×4): qty 1

## 2014-11-23 NOTE — Progress Notes (Addendum)
[facility] PROGRESS NOTE  Casey Grimes WFU:[id number] DOB: 1929-05-03 DOA: 11/22/2014 PCP: Judie Bonus, MD  Assessment/Plan: 1. Shakes/Myoclonus -due to gabapentin retention, was taking 300mg  TID with CKD4 -improved, off gabapentin now; per wife was started on this for chronic back pain -PT eval -no fevers or s/s of infection  2. Accelerated HTN -still uncontrolled, add clonidine, continue hydralazine, coreg -also on PO lasix 160mg  BID  3. AKI on CKD 4 -Dr.Gardner reviewed labs with Dr.DUnham and baseline in office around 3.2-3.6 -in EPIC, 2.7 in 3/16 -appears euvolemic -followed by Dr.Powel at [facility] -continue Po lasix, follow [facility]  4. DM -continue lantus, novolog with meals, SSI  5. CHronic back pain -h/o back surgery, gabapentin held due to #1  DVT proph: Hep SQ  Code Status: Full Code Family Communication: none at bedside, called and d/w wife (indicate person spoken with, relationship, and if by phone, the number) Disposition Plan: home in 1-2days   HPI/Subjective: Feels better, shakes improved  Objective: Filed Vitals:   11/23/14 0808  BP: 197/80  Pulse: 80  Temp: 98.5 F (36.9 C)  Resp: 18    Intake/Output Summary (Last 24 hours) at 11/23/14 1000 Last data filed at 11/23/14 0946  Gross per 24 hour  Intake    705 ml  Output    650 ml  Net     55 ml   Filed Weights   11/22/14 1654 11/22/14 2309  Weight: 99.791 kg (220 lb) 98.431 kg (217 lb)    Exam:   General: AAOx self and place, partly to time  Cardiovascular: S1S2/RRR  Respiratory: CTAB  Abdomen: soft, NT, BS present  Musculoskeletal: no edema c/c  Neuro: no asterixes or tremors   Data Reviewed: Basic Metabolic Panel:  Recent Labs Lab 11/22/14 1720 11/23/14 0550  NA 140 139  K 4.2 4.1  CL 106 107  CO2 22 23  GLUCOSE 184* 193*  BUN 68* 69*  CREATININE 3.63* 3.51*  CALCIUM 9.8 9.2  MG 2.5*  --    Liver Function Tests: No results for input(s): AST, ALT,  ALKPHOS, BILITOT, PROT, ALBUMIN in the last 168 hours. No results for input(s): LIPASE, AMYLASE in the last 168 hours. No results for input(s): AMMONIA in the last 168 hours. CBC:  Recent Labs Lab 11/22/14 1720 11/23/14 0550  WBC 6.3 6.1  HGB 13.6 12.4*  HCT 40.2 37.8*  MCV 82.9 84.9  PLT 264 242   Cardiac Enzymes:  Recent Labs Lab 11/22/14 1720 11/23/14 0550  CKTOTAL 947* 638*   BNP (last 3 results) No results for input(s): BNP in the last 8760 hours.  ProBNP (last 3 results) No results for input(s): PROBNP in the last 8760 hours.  CBG:  Recent Labs Lab 11/23/14 0738  GLUCAP 196*    No results found for this or any previous visit (from the past 240 hour(s)).   Studies: Dg Chest 2 View  11/22/2014   CLINICAL DATA:  Hypertension, chills x1 day.Pt is on HTN medications and has not missed any doses.Hx of HTN, DM, CKD, CVA, dementia.Ex-smoker.  EXAM: CHEST  2 VIEW  COMPARISON:  09/18/2014  FINDINGS: Cardiac silhouette is normal in size and configuration. No mediastinal or hilar masses or evidence of adenopathy.  Clear lungs.  No pleural effusion or pneumothorax.  Bony thorax is intact.  IMPRESSION: No active cardiopulmonary disease.   Electronically Signed   By: Amie Portland M.D.   On: 11/22/2014 18:59    Scheduled Meds: . allopurinol  150  mg Oral Daily  . amLODipine  10 mg Oral Daily  . aspirin EC  325 mg Oral Daily  . calcitRIOL  0.25 mcg Oral Daily  . carvedilol  25 mg Oral BID  . cloNIDine  0.2 mg Oral BID  . furosemide  160 mg Oral BID  . heparin  5,000 Units Subcutaneous 3 times per day  . hydrALAZINE  25 mg Oral TID  . insulin aspart  0-9 Units Subcutaneous TID WC  . insulin aspart  10 Units Subcutaneous TID WC  . insulin glargine  40 Units Subcutaneous QHS  . potassium chloride SA  20 mEq Oral BID   Continuous Infusions:  Antibiotics Given (last 72 hours)    None      Principal Problem:   Rigors Active Problems:   Diabetes mellitus with diabetic  nephropathy   Essential hypertension   CKD (chronic kidney disease) stage 4, GFR 15-29 ml/min    Time spent: 35 min    [facility]  [facility] Pager [phone]. If 7PM-7AM, please contact night-coverage at [email or web address], password [email or web address] 11/23/2014, 10:00 AM

## 2014-11-24 DIAGNOSIS — G253 Myoclonus: Secondary | ICD-10-CM | POA: Diagnosis not present

## 2014-11-24 DIAGNOSIS — R6889 Other general symptoms and signs: Secondary | ICD-10-CM | POA: Diagnosis not present

## 2014-11-24 DIAGNOSIS — E1121 Type 2 diabetes mellitus with diabetic nephropathy: Secondary | ICD-10-CM | POA: Diagnosis not present

## 2014-11-24 LAB — BASIC METABOLIC PANEL
ANION GAP: 9 (ref 5–15)
BUN: 73 mg/dL — ABNORMAL HIGH (ref 6–20)
CALCIUM: 9.1 mg/dL (ref 8.9–10.3)
CHLORIDE: 105 mmol/L (ref 101–111)
CO2: 26 mmol/L (ref 22–32)
CREATININE: 3.74 mg/dL — AB (ref 0.61–1.24)
GFR, EST AFRICAN AMERICAN: 16 mL/min — AB (ref >60)
GFR, EST NON AFRICAN AMERICAN: 14 mL/min — AB (ref >60)
Glucose, Bld: 188 mg/dL — ABNORMAL HIGH (ref 70–99)
Potassium: 4.3 mmol/L (ref 3.5–5.1)
Sodium: 140 mmol/L (ref 135–145)

## 2014-11-24 LAB — CBC
HEMATOCRIT: 35.1 % — AB (ref 39.0–52.0)
Hemoglobin: 11.4 g/dL — ABNORMAL LOW (ref 13.0–17.0)
MCH: 27.3 pg (ref 26.0–34.0)
MCHC: 32.5 g/dL (ref 30.0–36.0)
MCV: 84 fL (ref 78.0–100.0)
Platelets: 248 10*3/uL (ref 150–400)
RBC: 4.18 MIL/uL — ABNORMAL LOW (ref 4.22–5.81)
RDW: 15.4 % (ref 11.5–15.5)
WBC: 5.3 10*3/uL (ref 4.0–10.5)

## 2014-11-24 LAB — GLUCOSE, CAPILLARY: GLUCOSE-CAPILLARY: 184 mg/dL — AB (ref 70–99)

## 2014-11-24 MED ORDER — CLONIDINE HCL 0.2 MG PO TABS: 0.2000 mg | ORAL_TABLET | Freq: Two times a day (BID) | ORAL | Status: DC

## 2014-11-24 MED ORDER — FUROSEMIDE 80 MG PO TABS: 160.0000 mg | ORAL_TABLET | Freq: Two times a day (BID) | ORAL | Status: DC

## 2014-11-24 NOTE — Progress Notes (Signed)
Discharge instructions and medications discussed with patient.  All questions answered.  

## 2014-11-25 ENCOUNTER — Telehealth: Payer: Self-pay | Admitting: *Deleted

## 2014-11-25 LAB — GLUCOSE, CAPILLARY: GLUCOSE-CAPILLARY: 224 mg/dL — AB (ref 70–99)

## 2014-11-25 NOTE — Telephone Encounter (Signed)
[facility] - Client TELEPHONE ADVICE RECORD [facility] Patient Name: Casey SickleLOUIS Grimes Gender: Male DOB: 05/05/1929 Age: 81 Y 4 M 2 D Return Phone Number: [phone] (Primary) Address: [address] [place]Zip: [place] [address] Client [facility] - Client Client Site  [facility] Day Physician Genella MechKollar, Elizabeth Contact Type Call Call Type Triage / Clinical Caller Name Jacquline Herzig Relationship To Patient Spouse Appointment Disposition EMR Appointment Not Necessary Info pasted into Epic Yes Return Phone Number [phone] (Primary) Chief Complaint Muscle Jerks, Tics And Shudders Initial Comment Caller states that he is not feeling well and is trembling bp is 196/97 pulse 98 PreDisposition Call Doctor Nurse Assessment Nurse: Laural BenesJohnson, RN, Dondra SpryGail Date/Time (Eastern Time): 11/22/2014 3:55:01 PM Confirm and document reason for call. If symptomatic, describe symptoms. ---[name] states his back hurts 196/97 98 trembling is urinating ok and drinking plenty of fluids blood sugar is high at 215 Has the patient traveled out of the country within the last 30 days? ---No Does the patient require triage? ---Yes Related visit to physician within the last 2 weeks? ---No Does the PT have any chronic conditions? (i.e. diabetes, asthma, etc.) ---Yes List chronic conditions. ---Diabetes HTN Kidney failure Guidelines Guideline Title Affirmed Question Affirmed Notes Nurse Date/Time (Eastern Time) High Blood Pressure BP # 160/100 Liberty HandyJohnson, RN, Gail 11/22/2014 3:57:40 PM Disp. Time [name](Eastern Time) Disposition Final User 11/22/2014 4:02:20 PM Go to ED Now Yes Laural BenesJohnson, RN, Dondra SpryGail Disposition Overriden: See PCP When Office is Open (within 3 days) Override Reason: Patient's symptoms need a higher level of care Caller Understands: Yes Disagree/Comply: Comply Care Advice Given Per Guideline PLEASE NOTE: All timestamps contained  within this report are represented as [place] Standard Time. CONFIDENTIALTY NOTICE: This fax transmission is intended only for the addressee. It contains information that is legally privileged, confidential or otherwise protected from use or disclosure. If you are not the intended recipient, you are strictly prohibited from reviewing, disclosing, copying using or disseminating any of this information or taking any action in reliance on or regarding this information. If you have received this fax in error, please notify us immediately by telephone so that we can arrange for its return to us. Phone: [phone], Toll-Free: [phone], Fax: [phone] Page: 2 of 2 Call Id: [id number] Care Advice Given Per Guideline GO TO ED NOW: You need to be seen in the Emergency Department. Go to the ER at ___________ Hospital. Leave now. Drive carefully. * Another adult should drive. * Patient passes out, starts acting confused or becomes too weak to stand. * You become worse. CARE ADVICE given per High Blood Pressure (Adult) guideline. * If immediate transportation is not available via car or taxi, then the patient should be instructed to call [id number]. After Care Instructions Given Call Event Type User Date / Time Description [facility]

## 2014-11-26 ENCOUNTER — Telehealth: Payer: Self-pay | Admitting: *Deleted

## 2014-11-26 NOTE — Discharge Summary (Signed)
Physician Discharge Summary  Casey Grimes WUJ:[id number] DOB: 08/10/1928 DOA: 11/22/2014  PCP: Judie Bonus, MD  Admit date: 11/22/2014 Discharge date: 11/25/2014  Time spent: 45 minutes  Recommendations for Outpatient Follow-up:  1. Dr.Powell in 1-2weeks, needs FU Bmet then, Dr.DUnham to send message to Office  Discharge Diagnoses:  Principal Problem:   Myoclonus   Diabetes mellitus with diabetic nephropathy   Essential hypertension   CKD (chronic kidney disease) stage 4, GFR 15-29 ml/min   Chronic back pain   Discharge Condition:stable  Diet recommendation: Renal  Filed Weights   11/22/14 1654 11/22/14 2309 11/23/14 2037  Weight: 99.791 kg (220 lb) 98.431 kg (217 lb) 98.884 kg (218 lb)    History of present illness:  Chief Complaint: HTN, shakes HPI: Casey Grimes is a 81 y.o. male with h/o DM2, HTN, CKD stage 4, patient presentedto ED with onset of "not feeling well" and involuntary shakes x 1day. Patient stated he has been shaking all over, He took his BP at home and it was high(190s systolic) despite taking meds  Hospital Course:  1. Shakes/Myoclonus -due to gabapentin retention, was taking 300mg  TID with Advanced CKD4 -resolved, off gabapentin now; per wife was started on this for chronic back pain -no fevers or s/s of infection  2. Accelerated HTN -Added clonidine, continued hydralazine, coreg -also on PO lasix 160mg  BID -improved to 120s systolic at discharge  3. AKI vs CKD 4 -Myself and admitter Dr.Gardner reviewed labs with Dr.DUnham from Renal service and this patients baseline creatinine based on office labs at [facility] is around 3.2-3.6, although in EPIC, creatinine was 2.7 in 3/16 -appears euvolemic and no s/s of volume overload -followed by Dr.Powel at [facility], i discussed with Dr.DUnham at the day of discharge and she will send a message to the office on Monday to get him a quick FU with labs with Dr.Powell -continue Po lasix  4. DM -continue lantus,  novolog with meals, SSI  5. CHronic back pain -h/o back surgery, gabapentin held due to #1  Consultations:  D/w Renal Dr.Dunham  Discharge Exam: Filed Vitals:   11/24/14 1045  BP: 122/62  Pulse: 60  Temp:   Resp:     General: AAOx3 Cardiovascular: S1S2/RRR Respiratory: CTAB  Discharge Instructions   Discharge Instructions    Diet - low sodium heart healthy    Complete by:  As directed      Diet Carb Modified    Complete by:  As directed      Increase activity slowly    Complete by:  As directed           Discharge Medication List as of 11/24/2014 12:06 PM    START taking these medications   Details  cloNIDine (CATAPRES) 0.2 MG tablet Take 1 tablet (0.2 mg total) by mouth 2 (two) times daily., Starting 11/24/2014, Until Discontinued, Normal      CONTINUE these medications which have CHANGED   Details  furosemide (LASIX) 80 MG tablet Take 2 tablets (160 mg total) by mouth 2 (two) times daily., Starting 11/24/2014, Until Discontinued, No Print      CONTINUE these medications which have NOT CHANGED   Details  allopurinol (ZYLOPRIM) 300 MG tablet Take 150 mg by mouth daily.  , Until Discontinued, Historical Med    amLODipine (NORVASC) 10 MG tablet TAKE 1 TABLET BY MOUTH EVERY DAY, Normal    aspirin EC 325 MG tablet Take 325 mg by mouth daily., Until Discontinued, Historical Med  atorvastatin (LIPITOR) 10 MG tablet TAKE 1 TABLET (10 MG TOTAL) BY MOUTH DAILY., Normal    calcitRIOL (ROCALTROL) 0.25 MCG capsule Take 1 capsule (0.25 mcg total) by mouth daily., Starting 10/02/2014, Until Discontinued, Normal    carvedilol (COREG) 25 MG tablet TAKE 1 TABLET BY MOUTH TWICE A DAY, Normal    glucose blood test strip Use to check blood sugar 4 times daily. E11.21, Normal    HUMALOG 100 UNIT/ML injection Inject 10 Units into the skin 3 (three) times daily with meals. , Starting 08/15/2014, Until Discontinued, Historical Med    hydrALAZINE (APRESOLINE) 25 MG tablet Take 1  tablet (25 mg total) by mouth 3 (three) times daily., Starting 09/26/2014, Until Discontinued, Normal    insulin glargine (LANTUS) 100 UNIT/ML injection Inject 0.4 mLs (40 Units total) into the skin at bedtime., Starting 05/29/2014, Until Discontinued, Normal    Insulin Syringe-Needle U-100 (B-D INS SYRINGE 0.5CC/30GX1/2") 30G X 1/2" 0.5 ML MISC USE 3 PER DAY AS DIRECTED, Fax    KLOR-CON M20 20 MEQ tablet TAKE 1 TABLET (20 MEQ TOTAL) BY MOUTH 2 (TWO) TIMES DAILY., Normal      STOP taking these medications     gabapentin (NEURONTIN) 300 MG capsule        No Known Allergies Follow-up Information    Follow up with Judie Bonus, MD. Schedule an appointment as soon as possible for a visit in 1 week.   Specialty:  Internal Medicine   Why:  , needs lab[facility] at [facility]   Contact information:   [address] [place] [address] [phone]       Follow up with [facility] C, MD. Schedule an appointment as soon as possible for a visit in 2 weeks.   Specialty:  Nephrology   Contact information:   [address]                          [place] [address] [phone]        The results of significant diagnostics from this hospitalization (including imaging, microbiology, ancillary and laboratory) are listed below for reference.    Significant Diagnostic Studies: Dg Chest 2 View  11/22/2014   CLINICAL DATA:  Hypertension, chills x1 day.Pt is on HTN medications and has not missed any doses.Hx of HTN, DM, CKD, CVA, dementia.Ex-smoker.  EXAM: CHEST  2 VIEW  COMPARISON:  09/18/2014  FINDINGS: Cardiac silhouette is normal in size and configuration. No mediastinal or hilar masses or evidence of adenopathy.  Clear lungs.  No pleural effusion or pneumothorax.  Bony thorax is intact.  IMPRESSION: No active cardiopulmonary disease.   Electronically Signed   By: Amie Portland M.D.   On: 11/22/2014 18:59    Microbiology: Recent Results (from the past 240 hour(s))  Urine culture      Status: None   Collection Time: 11/22/14  6:10 PM  Result Value Ref Range Status   Specimen Description URINE, CLEAN CATCH  Final   Special Requests NONE  Final   Colony Count NO GROWTH Performed at [facility]   Final   Culture NO GROWTH Performed at [facility]   Final   Report Status 11/23/2014 FINAL  Final  Culture, blood (routine x 2)     Status: None (Preliminary result)   Collection Time: 11/22/14 10:26 PM  Result Value Ref Range Status   Specimen Description BLOOD LEFT HAND  Final   Special Requests BOTTLES DRAWN AEROBIC AND  ANAEROBIC 5CC  Final   Culture   Final           BLOOD CULTURE RECEIVED NO GROWTH TO DATE CULTURE WILL BE HELD FOR 5 DAYS BEFORE ISSUING A FINAL NEGATIVE REPORT Performed at [facility]    Report Status PENDING  Incomplete  Culture, blood (routine x 2)     Status: None (Preliminary result)   Collection Time: 11/22/14 10:28 PM  Result Value Ref Range Status   Specimen Description BLOOD RIGHT HAND  Final   Special Requests BOTTLES DRAWN AEROBIC AND ANAEROBIC 5CC  Final   Culture   Final           BLOOD CULTURE RECEIVED NO GROWTH TO DATE CULTURE WILL BE HELD FOR 5 DAYS BEFORE ISSUING A FINAL NEGATIVE REPORT Performed at [facility]    Report Status PENDING  Incomplete     Labs: Basic Metabolic Panel:  Recent Labs Lab 11/22/14 1720 11/23/14 0550 11/24/14 0350  NA 140 139 140  K 4.2 4.1 4.3  CL 106 107 105  CO2 22 23 26   GLUCOSE 184* 193* 188*  BUN 68* 69* 73*  CREATININE 3.63* 3.51* 3.74*  CALCIUM 9.8 9.2 9.1  MG 2.5*  --   --    Liver Function Tests: No results for input(s): AST, ALT, ALKPHOS, BILITOT, PROT, ALBUMIN in the last 168 hours. No results for input(s): LIPASE, AMYLASE in the last 168 hours. No results for input(s): AMMONIA in the last 168 hours. CBC:  Recent Labs Lab 11/22/14 1720 11/23/14 0550 11/24/14 0350  WBC 6.3 6.1 5.3  HGB 13.6 12.4* 11.4*  HCT 40.2 37.8* 35.1*  MCV 82.9  84.9 84.0  PLT 264 242 248   Cardiac Enzymes:  Recent Labs Lab 11/22/14 1720 11/23/14 0550  CKTOTAL 947* 638*   BNP: BNP (last 3 results) No results for input(s): BNP in the last 8760 hours.  ProBNP (last 3 results) No results for input(s): PROBNP in the last 8760 hours.  CBG:  Recent Labs Lab 11/23/14 0738 11/23/14 1108 11/23/14 1553 11/23/14 2035 11/24/14 0800  GLUCAP 196* 241* 219* 74 184*       Signed:  Tamaira Ciriello  [facility] 11/26/2014, 2:55 PM

## 2014-11-26 NOTE — Telephone Encounter (Signed)
Transition Care Management Follow-up Telephone Call D/C 11/23/14  How have you been since you were released from the hospital? Pt states he is feeling fine   Do you understand why you were in the hospital? YES   Do you understand the discharge instrcutions? YES  Items Reviewed:  Medications reviewed: YES  Allergies reviewed: YES  Dietary changes reviewed: YES  Referrals reviewed: NO referrals needed   Functional Questionnaire:   Activities of Daily Living (ADLs):   He states he are independent in the following: ambulation, bathing and hygiene, feeding, continence, grooming, toileting and dressing States he doesn't require assistance    Any transportation issues/concerns? NO   Any patient concerns? /NO   Confirmed importance and date/time of follow-up visits scheduled: YES, appt has been made 11/29/14 w/Dr. Dorise HissKollar   Confirmed with patient if condition begins to worsen call PCP or go to the ER.

## 2014-11-29 ENCOUNTER — Inpatient Hospital Stay: Payer: Medicare Other | Admitting: Internal Medicine

## 2014-11-29 LAB — CULTURE, BLOOD (ROUTINE X 2)
CULTURE: NO GROWTH
Culture: NO GROWTH

## 2014-12-02 ENCOUNTER — Inpatient Hospital Stay: Payer: Medicare Other | Admitting: Internal Medicine

## 2014-12-02 DIAGNOSIS — R4689 Other symptoms and signs involving appearance and behavior: Secondary | ICD-10-CM | POA: Insufficient documentation

## 2014-12-06 ENCOUNTER — Encounter: Payer: Self-pay | Admitting: Family

## 2014-12-06 ENCOUNTER — Other Ambulatory Visit (INDEPENDENT_AMBULATORY_CARE_PROVIDER_SITE_OTHER): Payer: Medicare Other

## 2014-12-06 ENCOUNTER — Ambulatory Visit (INDEPENDENT_AMBULATORY_CARE_PROVIDER_SITE_OTHER): Payer: Medicare Other | Admitting: Family

## 2014-12-06 VITALS — BP 160/62 | HR 60 | Temp 98.5°F | Resp 18 | Ht 71.0 in | Wt 224.0 lb

## 2014-12-06 DIAGNOSIS — G253 Myoclonus: Secondary | ICD-10-CM | POA: Diagnosis not present

## 2014-12-06 DIAGNOSIS — I10 Essential (primary) hypertension: Secondary | ICD-10-CM | POA: Diagnosis not present

## 2014-12-06 LAB — BASIC METABOLIC PANEL
BUN: 56 mg/dL — ABNORMAL HIGH (ref 6–23)
CHLORIDE: 105 meq/L (ref 96–112)
CO2: 22 meq/L (ref 19–32)
Calcium: 9.4 mg/dL (ref 8.4–10.5)
Creatinine, Ser: 3.06 mg/dL — ABNORMAL HIGH (ref 0.40–1.50)
GFR: 25.16 mL/min — ABNORMAL LOW (ref >60.00)
GLUCOSE: 124 mg/dL — AB (ref 70–99)
POTASSIUM: 4 meq/L (ref 3.5–5.1)
SODIUM: 138 meq/L (ref 135–145)

## 2014-12-06 MED ORDER — HYDRALAZINE HCL 50 MG PO TABS: 50.0000 mg | ORAL_TABLET | Freq: Three times a day (TID) | ORAL | Status: DC

## 2014-12-06 NOTE — Patient Instructions (Addendum)
Thank you for choosing [facility].  Summary/Instructions:  Your prescription(s) have been submitted to your pharmacy or been printed and provided for you. Please take as directed and contact our office if you believe you are having problem(s) with the medication(s) or have any questions.  If your symptoms worsen or fail to improve, please contact our office for further instruction, or in case of emergency go directly to the emergency room at the closest medical facility.   Please increase the hydralizine to 50 mg 3 times per day. For the time please take 2 - 25 mg tablets each time until completed. Then START with the NEW prescription of 50 mg tablets and return to 1 tablet 3 x per day.   Please continue to take your blood pressure at home.

## 2014-12-06 NOTE — Progress Notes (Signed)
Subjective:    Patient ID: Casey Grimes Branum, male    DOB: 10/26/1928, 81 y.o.   MRN: [id number]  Chief Complaint  Patient presents with  . Hospitalization Follow-up    was in the hospital for hypertension, still has been having high bp top number close to 200s in the mornings    HPI:  Casey Grimes Mchaney is a 81 y.o. male with a PMH of hypertension, stroke, diabetes, chronic kidney disease stage IV, hyperlipidemia, and noncompliant behavior who presents today for an office visit following hospitalization. His wife is present for today's visit and provides a reasonable amount of the history.   Recently admitted to the hospital following an emergency room visit for not feeling well and "shaking all over". He was admitted for rigors which were found to be related to Neurontin. Elevated creatinine kinase but not rhabdomyolysis and chronic kidney disease stage IV.  The shakes/myoclonus were resolved with gabapentin discontinuation. Clonidine was added to help control his blood pressure. And he was discharged and scheduled to follow up with nephrology. Hospitalist recommendation follow-up basic metabolic panel.. All hospital records reviewed in detail.  Indicates that he has been feeling better since being discharged from the hospital. Notes that he is for the most part back to baseline prior to hospitalization. Continues to experience the associated symptom of increased blood pressure. Other than the blood pressure he has no other complaints.   1) Hypertension - Notes increased systolic readings in around 200 and this is following voiding and resting for several minutes. Currently maintained on amlodipine, clonidine, furosemide, coreg and hydralazine. Takes his medications as prescribed with no adverse side effects.   BP Readings from Last 3 Encounters:  12/06/14 160/62  11/24/14 122/62  09/26/14 162/78    No Known Allergies   Current Outpatient Prescriptions on File Prior to Visit  Medication Sig  Dispense Refill  . allopurinol (ZYLOPRIM) 300 MG tablet Take 150 mg by mouth daily.      Marland Kitchen. amLODipine (NORVASC) 10 MG tablet TAKE 1 TABLET BY MOUTH EVERY DAY 90 tablet 3  . aspirin EC 325 MG tablet Take 325 mg by mouth daily.    Marland Kitchen. atorvastatin (LIPITOR) 10 MG tablet TAKE 1 TABLET (10 MG TOTAL) BY MOUTH DAILY. 90 tablet 3  . calcitRIOL (ROCALTROL) 0.25 MCG capsule Take 1 capsule (0.25 mcg total) by mouth daily. 90 capsule 3  . carvedilol (COREG) 25 MG tablet TAKE 1 TABLET BY MOUTH TWICE A DAY 60 tablet 0  . cloNIDine (CATAPRES) 0.2 MG tablet Take 1 tablet (0.2 mg total) by mouth 2 (two) times daily. 60 tablet 0  . furosemide (LASIX) 80 MG tablet Take 2 tablets (160 mg total) by mouth 2 (two) times daily.    Marland Kitchen. glucose blood test strip Use to check blood sugar 4 times daily. E11.21 100 each 12  . HUMALOG 100 UNIT/ML injection Inject 10 Units into the skin 3 (three) times daily with meals.   11  . insulin glargine (LANTUS) 100 UNIT/ML injection Inject 0.4 mLs (40 Units total) into the skin at bedtime. 10 mL 6  . Insulin Syringe-Needle U-100 (B-D INS SYRINGE 0.5CC/30GX1/2") 30G X 1/2" 0.5 ML MISC USE 3 PER DAY AS DIRECTED 270 each 3  . KLOR-CON M20 20 MEQ tablet TAKE 1 TABLET (20 MEQ TOTAL) BY MOUTH 2 (TWO) TIMES DAILY. 180 tablet 0   No current facility-administered medications on file prior to visit.    Past Medical History  Diagnosis Date  . Gout   .  Hypertension   . Impotence   . Former smoker     quit 1988  . Depression   . Hyperlipidemia   . Osteoarthritis   . Chronic kidney disease (CKD), stage IV (severe)     Hattie Perch/notes 07/23/2013  . Type II diabetes mellitus   . Diabetic peripheral neuropathy     Hattie Perch/notes 07/23/2013  . Respiratory failure with hypoxia 07/23/2013    Hattie Perch/notes 07/23/2013  . Orthopnoea   . CVA (cerebral vascular accident) 1980's    denies residual on 07/23/2013  . Chronic lower back pain   . Dementia      Review of Systems  Constitutional: Negative for fever and chills.    Respiratory: Negative for chest tightness and shortness of breath.   Cardiovascular: Negative for chest pain, palpitations and leg swelling.  Neurological: Negative for tremors and weakness.      Objective:    BP 160/62 mmHg  Pulse 60  Temp(Src) 98.5 F (36.9 C) (Oral)  Resp 18  Ht 5\' 11"  (1.803 m)  Wt 224 lb (101.606 kg)  BMI 31.26 kg/m2  SpO2 97% Nursing note and vital signs reviewed.  Physical Exam  Constitutional: He is oriented to person, place, and time. He appears well-developed and well-nourished. No distress.  Elderly gentleman seated in the chair with a walker in front of him; dressed appropriately; appears his stated age; his quiet and conserved but answers questions appropriately.   Cardiovascular: Normal rate, regular rhythm, normal heart sounds and intact distal pulses.   Pulmonary/Chest: Effort normal and breath sounds normal.  Neurological: He is alert and oriented to person, place, and time.  Skin: Skin is warm and dry.  Psychiatric: He has a normal mood and affect. His behavior is normal. Judgment and thought content normal.       Assessment & Plan:

## 2014-12-06 NOTE — Assessment & Plan Note (Addendum)
Continues to experience hypertensive readings about 140/90. Increase hydralazine to 50 mg. Continue current dosages of coreg, furosemide, amlodipine, and clonidine. Continue to take blood pressure at home. Follow up in 2 weeks to recheck blood pressure.

## 2014-12-06 NOTE — Progress Notes (Signed)
Pre visit review using our clinic review tool, if applicable. No additional management support is needed unless otherwise documented below in the visit note. 

## 2014-12-06 NOTE — Assessment & Plan Note (Signed)
Symptoms of myclonus resolved since leaving the hospital and discontinuation of gabapentin. Continue to take other medications as prescribed. Follow up if symptoms return.

## 2014-12-08 ENCOUNTER — Telehealth: Payer: Self-pay | Admitting: Family

## 2014-12-08 NOTE — Telephone Encounter (Signed)
Please inform patient that his blood work is similar to previous readings indicating stage IV chronic kidney disease. Otherwise, his electrolytes are normal and no further treatment is necessary at this time regarding his hospitalization. Please follow-up with Dr. Dorise HissKollar.

## 2014-12-09 NOTE — Telephone Encounter (Signed)
Pt aware of results 

## 2014-12-09 NOTE — Telephone Encounter (Signed)
Patient returned your call.

## 2014-12-09 NOTE — Telephone Encounter (Signed)
Left message to a family member for pt to call back.

## 2014-12-11 ENCOUNTER — Encounter: Payer: Self-pay | Admitting: Cardiology

## 2014-12-11 ENCOUNTER — Ambulatory Visit (INDEPENDENT_AMBULATORY_CARE_PROVIDER_SITE_OTHER): Payer: Medicare Other | Admitting: Cardiology

## 2014-12-11 VITALS — BP 152/80 | HR 66 | Ht 71.0 in | Wt 220.0 lb

## 2014-12-11 DIAGNOSIS — R0602 Shortness of breath: Secondary | ICD-10-CM | POA: Diagnosis not present

## 2014-12-11 DIAGNOSIS — N183 Chronic kidney disease, stage 3 unspecified: Secondary | ICD-10-CM

## 2014-12-11 DIAGNOSIS — I5032 Chronic diastolic (congestive) heart failure: Secondary | ICD-10-CM

## 2014-12-11 MED ORDER — HYDRALAZINE HCL 50 MG PO TABS: 50.0000 mg | ORAL_TABLET | Freq: Four times a day (QID) | ORAL | Status: DC
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
## 2014-12-11 NOTE — Patient Instructions (Signed)
Medication Instructions:  INCREASE HYDRALAZINE 50 MG TO FOUR TIMES A DAY  Labwork: NONE  Testing/Procedures: NONE  Follow-Up: Your physician wants you to follow-up in: 4 MONTH OV  You will receive a reminder letter in the mail two months in advance. If you don't receive a letter, please call our office to schedule the follow-up appointment.

## 2014-12-14 ENCOUNTER — Other Ambulatory Visit: Payer: Self-pay | Admitting: Internal Medicine

## 2014-12-18 ENCOUNTER — Other Ambulatory Visit: Payer: Self-pay | Admitting: Internal Medicine

## 2014-12-23 ENCOUNTER — Telehealth: Payer: Self-pay | Admitting: Internal Medicine

## 2014-12-23 ENCOUNTER — Other Ambulatory Visit: Payer: Self-pay | Admitting: Geriatric Medicine

## 2014-12-23 MED ORDER — CLONIDINE HCL 0.2 MG PO TABS: 0.2000 mg | ORAL_TABLET | Freq: Two times a day (BID) | ORAL | Status: DC

## 2014-12-23 NOTE — Telephone Encounter (Signed)
Is requesting refill on clonidine to be sent to [facility] on [address] .

## 2014-12-23 NOTE — Telephone Encounter (Signed)
Sent to pharmacy 

## 2014-12-27 ENCOUNTER — Encounter: Payer: Self-pay | Admitting: Internal Medicine

## 2014-12-27 ENCOUNTER — Ambulatory Visit (INDEPENDENT_AMBULATORY_CARE_PROVIDER_SITE_OTHER): Payer: Medicare Other | Admitting: Internal Medicine

## 2014-12-27 ENCOUNTER — Other Ambulatory Visit (INDEPENDENT_AMBULATORY_CARE_PROVIDER_SITE_OTHER): Payer: Medicare Other

## 2014-12-27 VITALS — BP 150/50 | HR 70 | Temp 98.4°F | Resp 14 | Wt 217.0 lb

## 2014-12-27 DIAGNOSIS — I10 Essential (primary) hypertension: Secondary | ICD-10-CM | POA: Diagnosis not present

## 2014-12-27 DIAGNOSIS — E1121 Type 2 diabetes mellitus with diabetic nephropathy: Secondary | ICD-10-CM | POA: Diagnosis not present

## 2014-12-27 DIAGNOSIS — N184 Chronic kidney disease, stage 4 (severe): Secondary | ICD-10-CM

## 2014-12-27 LAB — RENAL FUNCTION PANEL
ALBUMIN: 3.8 g/dL (ref 3.5–5.2)
BUN: 75 mg/dL — AB (ref 6–23)
CALCIUM: 9.4 mg/dL (ref 8.4–10.5)
CO2: 23 mEq/L (ref 19–32)
Chloride: 103 mEq/L (ref 96–112)
Creatinine, Ser: 3.17 mg/dL — ABNORMAL HIGH (ref 0.40–1.50)
GFR: 24.15 mL/min — ABNORMAL LOW (ref >60.00)
Glucose, Bld: 257 mg/dL — ABNORMAL HIGH (ref 70–99)
POTASSIUM: 4 meq/L (ref 3.5–5.1)
Phosphorus: 4.1 mg/dL (ref 2.3–4.6)
Sodium: 136 mEq/L (ref 135–145)

## 2014-12-27 NOTE — Assessment & Plan Note (Signed)
Now on hydralazine 50 mg QID, amlodipine 10 mg, coreg 25 mg BID, clonidine 0.2 BID, lasix 160 mg BID. Checking kidney function and electrolytes today.

## 2014-12-27 NOTE — Progress Notes (Signed)
   Subjective:    Patient ID: Casey Grimes, male    DOB: 1928-10-13, 81 y.o.   MRN: [id number]  HPI The patient is an 81 YO man who is coming in for check up of his blood pressure and diabetes. His wife provides most of the history and has brought a sugar log and BP log. Most morning sugars 150-100 and through the day increase to 200s. Denies hypoglycemia. Eats fairly stable meals and about equal. Still doing 40 units lantus and 10 units humalog with meals. Does not exercise much but is getting around the house fairly well. His blood pressure has been a little better since the increase in his medicine by the cardiologist. He is taking all his medicines. No headaches or chest pain. No nausea. No side effects from his medicines.   Review of Systems  Constitutional: Negative for fever, chills, activity change, appetite change and fatigue.  Eyes: Negative.   Respiratory: Negative for cough, chest tightness, shortness of breath and wheezing.   Cardiovascular: Negative for chest pain, palpitations and leg swelling.  Gastrointestinal: Negative for abdominal pain, diarrhea, constipation and abdominal distention.  Musculoskeletal: Positive for gait problem.       Good with walker  Skin: Negative for wound.  Neurological: Negative for dizziness, syncope, weakness, light-headedness and headaches.  Psychiatric/Behavioral: Negative.       Objective:   Physical Exam  Constitutional: He is oriented to person, place, and time. He appears well-developed and well-nourished.  Speech slow but content intact  HENT:  Head: Normocephalic and atraumatic.  Eyes: EOM are normal.  Neck: Normal range of motion. Neck supple. No JVD present.  Cardiovascular: Normal rate and regular rhythm.   No murmur heard. Pulmonary/Chest: Effort normal and breath sounds normal. No respiratory distress. He has no wheezes. He has no rales. He exhibits no tenderness.  Abdominal: Soft. Bowel sounds are normal. He exhibits no  distension. There is no tenderness. There is no rebound.  Musculoskeletal: Normal range of motion. He exhibits no edema.  Neurological: He is alert and oriented to person, place, and time.  Patient walked well with walker and slow with standing.   Skin: Skin is warm and dry.  Nursing note and vitals reviewed.  Filed Vitals:   12/27/14 1118  BP: 150/50  Pulse: 70  Temp: 98.4 F (36.9 C)  TempSrc: Oral  Resp: 14  Weight: 217 lb (98.431 kg)  SpO2: 93%      Assessment & Plan:

## 2014-12-27 NOTE — Progress Notes (Signed)
Pre visit review using our clinic review tool, if applicable. No additional management support is needed unless otherwise documented below in the visit note. 

## 2014-12-27 NOTE — Assessment & Plan Note (Signed)
Recheck renal functional panel today for stability of renal function now that BP is better controlled.

## 2014-12-27 NOTE — Assessment & Plan Note (Signed)
Will increase his humalog to 12 units before meals and leave lantus 40 units at night time. Most of meal sugars are low to high 200s but morning sugars okay. His meals are fairly stable. His wife does not feel like she needs more nutrition information. Talked to her about limiting carbs. No hypoglycemia. Due to his CKD HgA1c not an accurate measurement.

## 2014-12-27 NOTE — Patient Instructions (Addendum)
We are going to leave the lantus dose the same (40 units in the evening). We are going to increase your dose of humalog to 12 units before meals. This should help to keep his sugars from going up during the day. We may need to adjust it again but this should help some.   We are leaving the rest of the medicines the same as the blood pressure is doing better today.   Keep working on being active and moving around the house to increase your strength.   Come back in about 2 months to check on the health and blood pressure. If you have any problems or questions before then please call the office.   Fall Prevention and Home Safety Falls cause injuries and can affect all age groups. It is possible to use preventive measures to significantly decrease the likelihood of falls. There are many simple measures which can make your home safer and prevent falls. OUTDOORS  Repair cracks and edges of walkways and driveways.  Remove high doorway thresholds.  Trim shrubbery on the main path into your home.  Have good outside lighting.  Clear walkways of tools, rocks, debris, and clutter.  Check that handrails are not broken and are securely fastened. Both sides of steps should have handrails.  Have leaves, snow, and ice cleared regularly.  Use sand or salt on walkways during winter months.  In the garage, clean up grease or oil spills. BATHROOM  Install night lights.  Install grab bars by the toilet and in the tub and shower.  Use non-skid mats or decals in the tub or shower.  Place a plastic non-slip stool in the shower to sit on, if needed.  Keep floors dry and clean up all water on the floor immediately.  Remove soap buildup in the tub or shower on a regular basis.  Secure bath mats with non-slip, double-sided rug tape.  Remove throw rugs and tripping hazards from the floors. BEDROOMS  Install night lights.  Make sure a bedside light is easy to reach.  Do not use oversized  bedding.  Keep a telephone by your bedside.  Have a firm chair with side arms to use for getting dressed.  Remove throw rugs and tripping hazards from the floor. KITCHEN  Keep handles on pots and pans turned toward the center of the stove. Use back burners when possible.  Clean up spills quickly and allow time for drying.  Avoid walking on wet floors.  Avoid hot utensils and knives.  Position shelves so they are not too high or low.  Place commonly used objects within easy reach.  If necessary, use a sturdy step stool with a grab bar when reaching.  Keep electrical cables out of the way.  Do not use floor polish or wax that makes floors slippery. If you must use wax, use non-skid floor wax.  Remove throw rugs and tripping hazards from the floor. STAIRWAYS  Never leave objects on stairs.  Place handrails on both sides of stairways and use them. Fix any loose handrails. Make sure handrails on both sides of the stairways are as long as the stairs.  Check carpeting to make sure it is firmly attached along stairs. Make repairs to worn or loose carpet promptly.  Avoid placing throw rugs at the top or bottom of stairways, or properly secure the rug with carpet tape to prevent slippage. Get rid of throw rugs, if possible.  Have an electrician put in a light switch at the  top and bottom of the stairs. OTHER FALL PREVENTION TIPS  Wear low-heel or rubber-soled shoes that are supportive and fit well. Wear closed toe shoes.  When using a stepladder, make sure it is fully opened and both spreaders are firmly locked. Do not climb a closed stepladder.  Add color or contrast paint or tape to grab bars and handrails in your home. Place contrasting color strips on first and last steps.  Learn and use mobility aids as needed. Install an electrical emergency response system.  Turn on lights to avoid dark areas. Replace light bulbs that burn out immediately. Get light switches that  glow.  Arrange furniture to create clear pathways. Keep furniture in the same place.  Firmly attach carpet with non-skid or double-sided tape.  Eliminate uneven floor surfaces.  Select a carpet pattern that does not visually hide the edge of steps.  Be aware of all pets. OTHER HOME SAFETY TIPS  Set the water temperature for 120 F (48.8 C).  Keep emergency numbers on or near the telephone.  Keep smoke detectors on every level of the home and near sleeping areas. Document Released: 06/25/2002 Document Revised: 01/04/2012 Document Reviewed: 09/24/2011 [employer] Patient Information 2015 [place], [place]. This information is not intended to replace advice given to you by your health care provider. Make sure you discuss any questions you have with your health care provider.

## 2014-12-29 ENCOUNTER — Other Ambulatory Visit: Payer: Self-pay | Admitting: Family

## 2014-12-31 ENCOUNTER — Telehealth: Payer: Self-pay

## 2014-12-31 NOTE — Telephone Encounter (Signed)
Advised patient of dr kollars note 

## 2014-12-31 NOTE — Telephone Encounter (Signed)
-----   Message from Judie Bonus, MD sent at 12/30/2014  8:08 AM EDT ----- Please call and let him know that his kidney function is stable.

## 2015-01-24 ENCOUNTER — Other Ambulatory Visit: Payer: Self-pay | Admitting: Family

## 2015-01-24 ENCOUNTER — Other Ambulatory Visit: Payer: Self-pay | Admitting: Internal Medicine

## 2015-01-27 ENCOUNTER — Other Ambulatory Visit: Payer: Self-pay | Admitting: Internal Medicine

## 2015-01-27 MED ORDER — HYDRALAZINE HCL 50 MG PO TABS: 50.0000 mg | ORAL_TABLET | Freq: Four times a day (QID) | ORAL | Status: DC

## 2015-01-28 ENCOUNTER — Other Ambulatory Visit: Payer: Self-pay | Admitting: Family

## 2015-01-30 ENCOUNTER — Other Ambulatory Visit: Payer: Self-pay | Admitting: Family

## 2015-03-20 ENCOUNTER — Other Ambulatory Visit: Payer: Self-pay | Admitting: Internal Medicine

## 2015-04-07 ENCOUNTER — Telehealth: Payer: Self-pay
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
## 2015-04-17 ENCOUNTER — Ambulatory Visit (INDEPENDENT_AMBULATORY_CARE_PROVIDER_SITE_OTHER): Payer: Medicare Other | Admitting: Internal Medicine

## 2015-04-17 ENCOUNTER — Encounter: Payer: Self-pay | Admitting: Internal Medicine

## 2015-04-17 ENCOUNTER — Other Ambulatory Visit (INDEPENDENT_AMBULATORY_CARE_PROVIDER_SITE_OTHER): Payer: Medicare Other

## 2015-04-17 VITALS — BP 180/62 | HR 80 | Temp 98.4°F | Resp 18 | Ht 71.0 in | Wt 234.1 lb

## 2015-04-17 DIAGNOSIS — Z23 Encounter for immunization: Secondary | ICD-10-CM | POA: Diagnosis not present

## 2015-04-17 DIAGNOSIS — E785 Hyperlipidemia, unspecified: Secondary | ICD-10-CM

## 2015-04-17 DIAGNOSIS — I5032 Chronic diastolic (congestive) heart failure: Secondary | ICD-10-CM

## 2015-04-17 DIAGNOSIS — N184 Chronic kidney disease, stage 4 (severe): Secondary | ICD-10-CM | POA: Diagnosis not present

## 2015-04-17 DIAGNOSIS — E1121 Type 2 diabetes mellitus with diabetic nephropathy: Secondary | ICD-10-CM

## 2015-04-17 DIAGNOSIS — I10 Essential (primary) hypertension: Secondary | ICD-10-CM

## 2015-04-17 LAB — CBC
HCT: 32.9 % — ABNORMAL LOW (ref 39.0–52.0)
HEMOGLOBIN: 10.8 g/dL — AB (ref 13.0–17.0)
MCHC: 32.8 g/dL (ref 30.0–36.0)
MCV: 85.6 fl (ref 78.0–100.0)
PLATELETS: 317 10*3/uL (ref 150.0–400.0)
RBC: 3.84 Mil/uL — ABNORMAL LOW (ref 4.22–5.81)
RDW: 16.6 % — AB (ref 11.5–15.5)
WBC: 7 10*3/uL (ref 4.0–10.5)

## 2015-04-17 LAB — RENAL FUNCTION PANEL
ALBUMIN: 3.4 g/dL — AB (ref 3.5–5.2)
BUN: 83 mg/dL — ABNORMAL HIGH (ref 6–23)
CHLORIDE: 106 meq/L (ref 96–112)
CO2: 21 mEq/L (ref 19–32)
Calcium: 9.3 mg/dL (ref 8.4–10.5)
Creatinine, Ser: 4.21 mg/dL — ABNORMAL HIGH (ref 0.40–1.50)
GFR: 17.4 mL/min — AB (ref >60.00)
Glucose, Bld: 246 mg/dL — ABNORMAL HIGH (ref 70–99)
Phosphorus: 4.5 mg/dL (ref 2.3–4.6)
Potassium: 4.7 mEq/L (ref 3.5–5.1)
Sodium: 138 mEq/L (ref 135–145)

## 2015-04-17 LAB — LIPID PANEL
Cholesterol: 110 mg/dL (ref 0–200)
HDL: 34.1 mg/dL — ABNORMAL LOW (ref >39.00)
LDL CALC: 40 mg/dL (ref 0–99)
NonHDL: 75.63
Total CHOL/HDL Ratio: 3
Triglycerides: 177 mg/dL — ABNORMAL HIGH (ref 0.0–149.0)
VLDL: 35.4 mg/dL (ref 0.0–40.0)

## 2015-04-17 NOTE — Patient Instructions (Addendum)
We are going to decrease the night time lantus to 35 units. If you are still having low sugars in the morning call the office.   We are checking the labs and may increase his fluid pill to 4 pills in the morning and 2 pills in the afternoon for 3 days. We will call you to let you know if you need to make this change.   Health Maintenance A healthy lifestyle and preventative care can promote health and wellness.  Maintain regular health, dental, and eye exams.  Eat a healthy diet. Foods like vegetables, fruits, whole grains, low-fat dairy products, and lean protein foods contain the nutrients you need and are low in calories. Decrease your intake of foods high in solid fats, added sugars, and salt. Get information about a proper diet from your health care provider, if necessary.  Regular physical exercise is one of the most important things you can do for your health. Most adults should get at least 150 minutes of moderate-intensity exercise (any activity that increases your heart rate and causes you to sweat) each week. In addition, most adults need muscle-strengthening exercises on 2 or more days a week.   Maintain a healthy weight. The body mass index (BMI) is a screening tool to identify possible weight problems. It provides an estimate of body fat based on height and weight. Your health care provider can find your BMI and can help you achieve or maintain a healthy weight. For males 20 years and older:  A BMI below 18.5 is considered underweight.  A BMI of 18.5 to 24.9 is normal.  A BMI of 25 to 29.9 is considered overweight.  A BMI of 30 and above is considered obese.  Maintain normal blood lipids and cholesterol by exercising and minimizing your intake of saturated fat. Eat a balanced diet with plenty of fruits and vegetables. Blood tests for lipids and cholesterol should begin at age 44 and be repeated every 5 years. If your lipid or cholesterol levels are high, you are over age 76, or  you are at high risk for heart disease, you may need your cholesterol levels checked more frequently.Ongoing high lipid and cholesterol levels should be treated with medicines if diet and exercise are not working.  If you smoke, find out from your health care provider how to quit. If you do not use tobacco, do not start.  Lung cancer screening is recommended for adults aged 55-80 years who are at high risk for developing lung cancer because of a history of smoking. A yearly low-dose CT scan of the lungs is recommended for people who have at least a 30-pack-year history of smoking and are current smokers or have quit within the past 15 years. A pack year of smoking is smoking an average of 1 pack of cigarettes a day for 1 year (for example, a 30-pack-year history of smoking could mean smoking 1 pack a day for 30 years or 2 packs a day for 15 years). Yearly screening should continue until the smoker has stopped smoking for at least 15 years. Yearly screening should be stopped for people who develop a health problem that would prevent them from having lung cancer treatment.  If you choose to drink alcohol, do not have more than 2 drinks per day. One drink is considered to be 12 oz (360 mL) of beer, 5 oz (150 mL) of wine, or 1.5 oz (45 mL) of liquor.  Avoid the use of street drugs. Do not share needles  with anyone. Ask for help if you need support or instructions about stopping the use of drugs.  High blood pressure causes heart disease and increases the risk of stroke. Blood pressure should be checked at least every 1-2 years. Ongoing high blood pressure should be treated with medicines if weight loss and exercise are not effective.  If you ar43e 45-11 years old, ask your health care provider if you should take aspirin to prevent heart disease.  Diabetes screening involves taking a blood sample to check your fasting blood sugar level. This should be done once every 3 years after age 67 if you are at a  normal weight and without risk factors for diabetes. Testing should be considered at a younger age or be carried out more frequently if you are overweight and have at least 1 risk factor for diabetes.  Colorectal cancer can be detected and often prevented. Most routine colorectal cancer screening begins at the age of 17 and continues through age 60. However, your health care provider may recommend screening at an earlier age if you have risk factors for colon cancer. On a yearly basis, your health care provider may provide home test kits to check for hidden blood in the stool. A small camera at the end of a tube may be used to directly examine the colon (sigmoidoscopy or colonoscopy) to detect the earliest forms of colorectal cancer. Talk to your health care provider about this at age 27 when routine screening begins. A direct exam of the colon should be repeated every 5-10 years through age 43, unless early forms of precancerous polyps or small growths are found.  People who are at an increased risk for hepatitis B should be screened for this virus. You are considered at high risk for hepatitis B if:  You were born in a country where hepatitis B occurs often. Talk with your health care provider about which countries are considered high risk.  Your parents were born in a high-risk country and you have not received a shot to protect against hepatitis B (hepatitis B vaccine).  You have HIV or AIDS.  You use needles to inject street drugs.  You live with, or have sex with, someone who has hepatitis B.  You are a man who has sex with other men (MSM).  You get hemodialysis treatment.  You take certain medicines for conditions like cancer, organ transplantation, and autoimmune conditions.  Hepatitis C blood testing is recommended for all people born from [birth year] through 1963 and any individual with known risk factors for hepatitis C.  Healthy men should no longer receive prostate-specific antigen  (PSA) blood tests as part of routine cancer screening. Talk to your health care provider about prostate cancer screening.  Testicular cancer screening is not recommended for adolescents or adult males who have no symptoms. Screening includes self-exam, a health care provider exam, and other screening tests. Consult with your health care provider about any symptoms you have or any concerns you have about testicular cancer.  Practice safe sex. Use condoms and avoid high-risk sexual practices to reduce the spread of sexually transmitted infections (STIs).  You should be screened for STIs, including gonorrhea and chlamydia if:  You are sexually active and are younger than 24 years.  You are older than 24 years, and your health care provider tells you that you are at risk for this type of infection.  Your sexual activity has changed since you were last screened, and you are at an increased risk  for chlamydia or gonorrhea. Ask your health care provider if you are at risk.  If you are at risk of being infected with HIV, it is recommended that you take a prescription medicine daily to prevent HIV infection. This is called pre-exposure prophylaxis (PrEP). You are considered at risk if:  You are a man who has sex with other men (MSM).  You are a heterosexual man who is sexually active with multiple partners.  You take drugs by injection.  You are sexually active with a partner who has HIV.  Talk with your health care provider about whether you are at high risk of being infected with HIV. If you choose to begin PrEP, you should first be tested for HIV. You should then be tested every 3 months for as long as you are taking PrEP.  Use sunscreen. Apply sunscreen liberally and repeatedly throughout the day. You should seek shade when your shadow is shorter than you. Protect yourself by wearing long sleeves, pants, a wide-brimmed hat, and sunglasses year round whenever you are outdoors.  Tell your health  care provider of new moles or changes in moles, especially if there is a change in shape or color. Also, tell your health care provider if a mole is larger than the size of a pencil eraser.  A one-time screening for abdominal aortic aneurysm (AAA) and surgical repair of large AAAs by ultrasound is recommended for men aged 65-75 years who are current or former smokers.  Stay current with your vaccines (immunizations). Document Released: 01/01/2008 Document Revised: 07/10/2013 Document Reviewed: 11/30/2010 [facility] Patient Information 2015 [place], [place]. This information is not intended to replace advice given to you by your health care provider. Make sure you discuss any questions you have with your health care provider.

## 2015-04-17 NOTE — Progress Notes (Signed)
Pre visit review using our clinic review tool, if applicable. No additional management support is needed unless otherwise documented below in the visit note. 

## 2015-04-18 NOTE — Assessment & Plan Note (Signed)
Suspect worsening of his kidney failure versus exacerbation of his CHF causing increasing fluid (weight up 15 pounds since last visit). Checking renal function panel and if potassium okay will double diuretic for 3 days to help with fluid and forward labs to his nephrologist. They are likely overdue for visit and have asked them to call for appointment. His kidney function has been rapidly declining over the last 6 months and if this continues is a poor prognostic sign. Still does not wish for dialysis and not a good candidate.

## 2015-04-18 NOTE — Assessment & Plan Note (Signed)
Clinically pulmonary edema on exam. Will increase diuretic for 3 days to see if this helps with the SOB.

## 2015-04-18 NOTE — Assessment & Plan Note (Signed)
BP moderately elevated today likely due to fluid overload. Will continue his coreg, clonidine, hydralazine, amlodipine and double the lasix for 3 days to help with the fluid. Last BP was close to goal. Checking renal function panel today.

## 2015-04-18 NOTE — Progress Notes (Signed)
   Subjective:    Patient ID: Casey Grimes, male    DOB: 1929/04/03, 81 y.o.   MRN: [id number]  HPI The patient is an 81 YO man scheduled for wellness but who is coming in with acute SOB. He has been having more problems with his breathing over the last 2-3 weeks. He is gaining fluid in his legs some. Hard time laying down at night with his breathing, has been propping up with more pillows. No fevers or chills and denies cough. No stomach pains or diarrhea or constipation. Has been taking his medicines as ordered. He does have CKD stage 4.  Next new problem is his sugars. He is now having some low sugars in the morning. He is then having to eat a lot of sugary food to help bring up the sugars. They have not adjusted his night time insulin. Many high sugars during the day after eating food to help with the low sugars. No change to his dosing of insulin.  He does have impaired memory and his wife provides most of the history.   Review of Systems  Constitutional: Negative for fever, chills, activity change, appetite change and fatigue.  HENT: Negative.   Eyes: Negative.   Respiratory: Positive for shortness of breath. Negative for cough, chest tightness and wheezing.   Cardiovascular: Positive for leg swelling. Negative for chest pain and palpitations.  Gastrointestinal: Negative for abdominal pain, diarrhea, constipation and abdominal distention.  Musculoskeletal: Positive for gait problem.       Good with walker  Skin: Negative for wound.  Neurological: Negative for dizziness, syncope, weakness, light-headedness and headaches.  Psychiatric/Behavioral: Negative.       Objective:   Physical Exam  Constitutional: He appears well-developed and well-nourished.  Speech slow but content intact  HENT:  Head: Normocephalic and atraumatic.  Eyes: EOM are normal.  Neck: Normal range of motion. Neck supple. No JVD present.  Cardiovascular: Normal rate and regular rhythm.   No murmur  heard. Pulmonary/Chest: Effort normal. No respiratory distress. He has no wheezes. He has rales. He exhibits no tenderness.  Bilateral rales at the bases  Abdominal: Soft. Bowel sounds are normal. He exhibits no distension. There is no tenderness. There is no rebound.  Musculoskeletal: Normal range of motion. He exhibits no edema.  Neurological: He is alert.  Patient walked well with walker and slow with standing.   Skin: Skin is warm and dry.  Nursing note and vitals reviewed.  Filed Vitals:   04/17/15 0928 04/17/15 0959  BP: 210/74 180/62  Pulse: 80   Temp: 98.4 F (36.9 C)   TempSrc: Oral   Resp: 18   Height:  (1.803 m)   Weight: 234 lb 1.9 oz (106.196 kg)   SpO2: 97%       Assessment & Plan:  Flu shot given at visit.

## 2015-04-18 NOTE — Assessment & Plan Note (Signed)
Given the low sugars will decrease the lantus from 40 units to 35 units daily to help with the morning low sugars. This will decrease the eating to replace the sugar and hopefully decrease the high sugars during the day. Overall control is fair and acceptable given his other co-morbidities.

## 2015-04-20 ENCOUNTER — Other Ambulatory Visit: Payer: Self-pay | Admitting: Internal Medicine

## 2015-04-29 ENCOUNTER — Ambulatory Visit (INDEPENDENT_AMBULATORY_CARE_PROVIDER_SITE_OTHER): Payer: Medicare Other | Admitting: Internal Medicine

## 2015-04-29 ENCOUNTER — Inpatient Hospital Stay (HOSPITAL_COMMUNITY)
Admission: EM | Admit: 2015-04-29 | Discharge: 2015-05-05 | DRG: 291 | Disposition: A | Discharge status: Home Health | Payer: Medicare Other | Attending: Family Medicine | Admitting: Family Medicine

## 2015-04-29 ENCOUNTER — Emergency Department (HOSPITAL_COMMUNITY): Payer: Medicare Other

## 2015-04-29 ENCOUNTER — Encounter: Payer: Self-pay | Admitting: Internal Medicine

## 2015-04-29 ENCOUNTER — Encounter (HOSPITAL_COMMUNITY): Payer: Self-pay | Admitting: Emergency Medicine

## 2015-04-29 VITALS — BP 170/68 | HR 68 | Temp 98.3°F | Resp 20 | Wt 240.0 lb

## 2015-04-29 DIAGNOSIS — N185 Chronic kidney disease, stage 5: Secondary | ICD-10-CM | POA: Diagnosis present

## 2015-04-29 DIAGNOSIS — N184 Chronic kidney disease, stage 4 (severe): Secondary | ICD-10-CM

## 2015-04-29 DIAGNOSIS — I5033 Acute on chronic diastolic (congestive) heart failure: Secondary | ICD-10-CM

## 2015-04-29 DIAGNOSIS — Z79899 Other long term (current) drug therapy: Secondary | ICD-10-CM

## 2015-04-29 DIAGNOSIS — E1142 Type 2 diabetes mellitus with diabetic polyneuropathy: Secondary | ICD-10-CM | POA: Diagnosis present

## 2015-04-29 DIAGNOSIS — N289 Disorder of kidney and ureter, unspecified: Secondary | ICD-10-CM | POA: Diagnosis not present

## 2015-04-29 DIAGNOSIS — Z794 Long term (current) use of insulin: Secondary | ICD-10-CM

## 2015-04-29 DIAGNOSIS — E0821 Diabetes mellitus due to underlying condition with diabetic nephropathy: Secondary | ICD-10-CM

## 2015-04-29 DIAGNOSIS — E1121 Type 2 diabetes mellitus with diabetic nephropathy: Secondary | ICD-10-CM | POA: Diagnosis present

## 2015-04-29 DIAGNOSIS — Z8673 Personal history of transient ischemic attack (TIA), and cerebral infarction without residual deficits: Secondary | ICD-10-CM

## 2015-04-29 DIAGNOSIS — N179 Acute kidney failure, unspecified: Secondary | ICD-10-CM | POA: Diagnosis present

## 2015-04-29 DIAGNOSIS — Z87891 Personal history of nicotine dependence: Secondary | ICD-10-CM | POA: Diagnosis not present

## 2015-04-29 DIAGNOSIS — I5043 Acute on chronic combined systolic (congestive) and diastolic (congestive) heart failure: Secondary | ICD-10-CM | POA: Diagnosis present

## 2015-04-29 DIAGNOSIS — F0390 Unspecified dementia without behavioral disturbance: Secondary | ICD-10-CM | POA: Diagnosis present

## 2015-04-29 DIAGNOSIS — F329 Major depressive disorder, single episode, unspecified: Secondary | ICD-10-CM | POA: Diagnosis present

## 2015-04-29 DIAGNOSIS — I132 Hypertensive heart and chronic kidney disease with heart failure and with stage 5 chronic kidney disease, or end stage renal disease: Principal | ICD-10-CM | POA: Diagnosis present

## 2015-04-29 DIAGNOSIS — I509 Heart failure, unspecified: Secondary | ICD-10-CM | POA: Insufficient documentation

## 2015-04-29 DIAGNOSIS — Z7982 Long term (current) use of aspirin: Secondary | ICD-10-CM | POA: Diagnosis not present

## 2015-04-29 DIAGNOSIS — E785 Hyperlipidemia, unspecified: Secondary | ICD-10-CM | POA: Diagnosis present

## 2015-04-29 DIAGNOSIS — E1122 Type 2 diabetes mellitus with diabetic chronic kidney disease: Secondary | ICD-10-CM | POA: Diagnosis present

## 2015-04-29 DIAGNOSIS — D631 Anemia in chronic kidney disease: Secondary | ICD-10-CM | POA: Diagnosis present

## 2015-04-29 DIAGNOSIS — I5032 Chronic diastolic (congestive) heart failure: Secondary | ICD-10-CM | POA: Diagnosis not present

## 2015-04-29 DIAGNOSIS — I10 Essential (primary) hypertension: Secondary | ICD-10-CM | POA: Diagnosis not present

## 2015-04-29 DIAGNOSIS — M109 Gout, unspecified: Secondary | ICD-10-CM | POA: Diagnosis present

## 2015-04-29 DIAGNOSIS — N189 Chronic kidney disease, unspecified: Secondary | ICD-10-CM

## 2015-04-29 HISTORY — DX: Orthopnea: R06.01

## 2015-04-29 HISTORY — DX: Chronic kidney disease, unspecified: N17.9

## 2015-04-29 HISTORY — DX: Dyspnea, unspecified: R06.00

## 2015-04-29 HISTORY — DX: Chronic diastolic (congestive) heart failure: I50.32

## 2015-04-29 HISTORY — DX: Chronic kidney disease, unspecified: N18.9

## 2015-04-29 LAB — BASIC METABOLIC PANEL
ANION GAP: 9 (ref 5–15)
BUN: 74 mg/dL — ABNORMAL HIGH (ref 6–20)
CHLORIDE: 108 mmol/L (ref 101–111)
CO2: 22 mmol/L (ref 22–32)
Calcium: 9.2 mg/dL (ref 8.9–10.3)
Creatinine, Ser: 4.23 mg/dL — ABNORMAL HIGH (ref 0.61–1.24)
GFR calc non Af Amer: 12 mL/min — ABNORMAL LOW (ref >60)
GFR, EST AFRICAN AMERICAN: 14 mL/min — AB (ref >60)
Glucose, Bld: 76 mg/dL (ref 65–99)
POTASSIUM: 4.5 mmol/L (ref 3.5–5.1)
SODIUM: 139 mmol/L (ref 135–145)

## 2015-04-29 LAB — I-STAT CHEM 8, ED
BUN: 68 mg/dL — AB (ref 6–20)
CALCIUM ION: 1.14 mmol/L (ref 1.13–1.30)
CHLORIDE: 113 mmol/L — AB (ref 101–111)
Creatinine, Ser: 4.3 mg/dL — ABNORMAL HIGH (ref 0.61–1.24)
GLUCOSE: 74 mg/dL (ref 65–99)
HEMATOCRIT: 33 % — AB (ref 39.0–52.0)
Hemoglobin: 11.2 g/dL — ABNORMAL LOW (ref 13.0–17.0)
POTASSIUM: 4.5 mmol/L (ref 3.5–5.1)
SODIUM: 143 mmol/L (ref 135–145)
TCO2: 20 mmol/L (ref 0–100)

## 2015-04-29 LAB — CBC WITH DIFFERENTIAL/PLATELET
BASOS PCT: 0 %
Basophils Absolute: 0 10*3/uL (ref 0.0–0.1)
EOS ABS: 0.4 10*3/uL (ref 0.0–0.7)
Eosinophils Relative: 5 %
HEMATOCRIT: 30.6 % — AB (ref 39.0–52.0)
HEMOGLOBIN: 9.9 g/dL — AB (ref 13.0–17.0)
Lymphocytes Relative: 23 %
Lymphs Abs: 1.6 10*3/uL (ref 0.7–4.0)
MCH: 27.6 pg (ref 26.0–34.0)
MCHC: 32.4 g/dL (ref 30.0–36.0)
MCV: 85.2 fL (ref 78.0–100.0)
Monocytes Absolute: 0.6 10*3/uL (ref 0.1–1.0)
Monocytes Relative: 8 %
NEUTROS ABS: 4.3 10*3/uL (ref 1.7–7.7)
NEUTROS PCT: 64 %
Platelets: 275 10*3/uL (ref 150–400)
RBC: 3.59 MIL/uL — AB (ref 4.22–5.81)
RDW: 16.3 % — ABNORMAL HIGH (ref 11.5–15.5)
WBC: 6.8 10*3/uL (ref 4.0–10.5)

## 2015-04-29 LAB — CBC
HEMATOCRIT: 32.5 % — AB (ref 39.0–52.0)
HEMOGLOBIN: 10.4 g/dL — AB (ref 13.0–17.0)
MCH: 27.1 pg (ref 26.0–34.0)
MCHC: 32 g/dL (ref 30.0–36.0)
MCV: 84.6 fL (ref 78.0–100.0)
Platelets: 303 10*3/uL (ref 150–400)
RBC: 3.84 MIL/uL — ABNORMAL LOW (ref 4.22–5.81)
RDW: 16.1 % — AB (ref 11.5–15.5)
WBC: 7.5 10*3/uL (ref 4.0–10.5)

## 2015-04-29 LAB — TSH: TSH: 3.037 u[IU]/mL (ref 0.350–4.500)

## 2015-04-29 LAB — CREATININE, SERUM
Creatinine, Ser: 4.1 mg/dL — ABNORMAL HIGH (ref 0.61–1.24)
GFR calc Af Amer: 14 mL/min — ABNORMAL LOW (ref >60)
GFR, EST NON AFRICAN AMERICAN: 12 mL/min — AB (ref >60)

## 2015-04-29 LAB — I-STAT TROPONIN, ED: TROPONIN I, POC: 0.07 ng/mL (ref 0.00–0.08)

## 2015-04-29 LAB — GLUCOSE, CAPILLARY
Glucose-Capillary: 85 mg/dL (ref 65–99)
Glucose-Capillary: 101 mg/dL — ABNORMAL HIGH (ref 65–99)

## 2015-04-29 LAB — BRAIN NATRIURETIC PEPTIDE: B NATRIURETIC PEPTIDE 5: 311.2 pg/mL — AB (ref 0.0–100.0)

## 2015-04-29 MED ORDER — SODIUM CHLORIDE 0.9 % IJ SOLN
3.0000 mL | Freq: Two times a day (BID) | INTRAMUSCULAR | Status: DC
  Administered 2015-05-01 – 2015-05-05 (×7): 3 mL via INTRAVENOUS

## 2015-04-29 MED ORDER — HEPARIN SODIUM (PORCINE) 5000 UNIT/ML IJ SOLN
5000.0000 [IU] | Freq: Three times a day (TID) | INTRAMUSCULAR | Status: DC
  Administered 2015-04-29 – 2015-05-05 (×16): 5000 [IU] via SUBCUTANEOUS
  Filled 2015-04-29 (×16): qty 1

## 2015-04-29 MED ORDER — INSULIN ASPART 100 UNIT/ML ~~LOC~~ SOLN
0.0000 [IU] | Freq: Three times a day (TID) | SUBCUTANEOUS | Status: DC
Start: 2015-04-30 — End: 2015-05-05
  Administered 2015-04-30 – 2015-05-01 (×4): 4 [IU] via SUBCUTANEOUS
  Administered 2015-05-02: 2 [IU] via SUBCUTANEOUS
  Administered 2015-05-02 (×2): 4 [IU] via SUBCUTANEOUS
  Administered 2015-05-03: 3 [IU] via SUBCUTANEOUS
  Administered 2015-05-03: 7 [IU] via SUBCUTANEOUS
  Administered 2015-05-03: 3 [IU] via SUBCUTANEOUS
  Administered 2015-05-04: 7 [IU] via SUBCUTANEOUS
  Administered 2015-05-04 (×2): 4 [IU] via SUBCUTANEOUS
  Administered 2015-05-05: 3 [IU] via SUBCUTANEOUS

## 2015-04-29 MED ORDER — CLONIDINE HCL 0.2 MG PO TABS
0.2000 mg | ORAL_TABLET | Freq: Two times a day (BID) | ORAL | Status: DC
  Administered 2015-04-29 – 2015-05-05 (×12): 0.2 mg via ORAL
  Filled 2015-04-29 (×12): qty 1

## 2015-04-29 MED ORDER — CALCITRIOL 0.25 MCG PO CAPS
0.2500 ug | ORAL_CAPSULE | Freq: Every day | ORAL | Status: DC
  Administered 2015-04-29 – 2015-05-05 (×7): 0.25 ug via ORAL
  Filled 2015-04-29 (×8): qty 1

## 2015-04-29 MED ORDER — ACETAMINOPHEN 325 MG PO TABS: 650.0000 mg | ORAL_TABLET | Freq: Four times a day (QID) | ORAL | Status: DC | PRN

## 2015-04-29 MED ORDER — ACETAMINOPHEN 650 MG RE SUPP: 650.0000 mg | Freq: Four times a day (QID) | RECTAL | Status: DC | PRN

## 2015-04-29 MED ORDER — FUROSEMIDE 10 MG/ML IJ SOLN
120.0000 mg | Freq: Two times a day (BID) | INTRAVENOUS | Status: DC
  Administered 2015-04-29 – 2015-04-30 (×2): 120 mg via INTRAVENOUS
  Filled 2015-04-29 (×4): qty 12

## 2015-04-29 MED ORDER — INSULIN GLARGINE 100 UNIT/ML ~~LOC~~ SOLN
20.0000 [IU] | Freq: Every day | SUBCUTANEOUS | Status: DC
  Administered 2015-04-29 – 2015-05-04 (×6): 20 [IU] via SUBCUTANEOUS
  Filled 2015-04-29 (×7): qty 0.2

## 2015-04-29 MED ORDER — SODIUM CHLORIDE 0.9 % IV SOLN: 250.0000 mL | INTRAVENOUS | Status: DC | PRN

## 2015-04-29 MED ORDER — SODIUM CHLORIDE 0.9 % IJ SOLN
3.0000 mL | INTRAMUSCULAR | Status: DC | PRN
  Administered 2015-05-04: 3 mL via INTRAVENOUS
  Filled 2015-04-29: qty 3

## 2015-04-29 MED ORDER — OXYCODONE HCL 5 MG PO TABS
5.0000 mg | ORAL_TABLET | ORAL | Status: DC | PRN
  Administered 2015-04-29: 5 mg via ORAL
  Filled 2015-04-29: qty 1

## 2015-04-29 MED ORDER — ONDANSETRON HCL 4 MG PO TABS: 4.0000 mg | ORAL_TABLET | Freq: Four times a day (QID) | ORAL | Status: DC | PRN

## 2015-04-29 MED ORDER — POTASSIUM CHLORIDE CRYS ER 20 MEQ PO TBCR
20.0000 meq | EXTENDED_RELEASE_TABLET | Freq: Every day | ORAL | Status: DC
  Administered 2015-04-29 – 2015-05-05 (×7): 20 meq via ORAL
  Filled 2015-04-29 (×7): qty 1

## 2015-04-29 MED ORDER — ONDANSETRON HCL 4 MG/2ML IJ SOLN: 4.0000 mg | Freq: Four times a day (QID) | INTRAMUSCULAR | Status: DC | PRN

## 2015-04-29 MED ORDER — HYDRALAZINE HCL 50 MG PO TABS
50.0000 mg | ORAL_TABLET | Freq: Four times a day (QID) | ORAL | Status: DC
  Administered 2015-04-29 – 2015-05-05 (×24): 50 mg via ORAL
  Filled 2015-04-29 (×28): qty 1

## 2015-04-29 MED ORDER — ALLOPURINOL 300 MG PO TABS
150.0000 mg | ORAL_TABLET | Freq: Every day | ORAL | Status: DC
  Administered 2015-04-30 – 2015-05-05 (×6): 150 mg via ORAL
  Filled 2015-04-29 (×6): qty 1

## 2015-04-29 MED ORDER — FUROSEMIDE 10 MG/ML IJ SOLN
20.0000 mg | Freq: Once | INTRAMUSCULAR | Status: AC
  Administered 2015-04-29: 20 mg via INTRAVENOUS
  Filled 2015-04-29: qty 2

## 2015-04-29 MED ORDER — AMLODIPINE BESYLATE 10 MG PO TABS
10.0000 mg | ORAL_TABLET | Freq: Every day | ORAL | Status: DC
  Administered 2015-04-30 – 2015-05-05 (×6): 10 mg via ORAL
  Filled 2015-04-29 (×6): qty 1

## 2015-04-29 MED ORDER — SODIUM CHLORIDE 0.9 % IJ SOLN
3.0000 mL | Freq: Two times a day (BID) | INTRAMUSCULAR | Status: DC
  Administered 2015-04-29 – 2015-05-05 (×12): 3 mL via INTRAVENOUS

## 2015-04-29 MED ORDER — CARVEDILOL 25 MG PO TABS
25.0000 mg | ORAL_TABLET | Freq: Two times a day (BID) | ORAL | Status: DC
  Administered 2015-04-29 – 2015-05-05 (×13): 25 mg via ORAL
  Filled 2015-04-29 (×13): qty 1
  Filled 2015-04-29: qty 2

## 2015-04-29 MED ORDER — ATORVASTATIN CALCIUM 10 MG PO TABS
10.0000 mg | ORAL_TABLET | Freq: Every day | ORAL | Status: DC
  Administered 2015-04-29 – 2015-05-04 (×6): 10 mg via ORAL
  Filled 2015-04-29 (×7): qty 1

## 2015-04-29 MED ORDER — INSULIN ASPART 100 UNIT/ML ~~LOC~~ SOLN: 0.0000 [IU] | Freq: Three times a day (TID) | SUBCUTANEOUS | Status: DC

## 2015-04-29 MED ORDER — ASPIRIN EC 325 MG PO TBEC
325.0000 mg | DELAYED_RELEASE_TABLET | Freq: Every day | ORAL | Status: DC
  Administered 2015-04-29 – 2015-05-05 (×7): 325 mg via ORAL
  Filled 2015-04-29 (×7): qty 1

## 2015-04-29 NOTE — ED Notes (Signed)
Pt arrives with family members from home with SOB for the last 4 weeks. Seen by PMD approx 2 weeks ago and they increased lasix. Pt with generalized edema, 4+ in lower extremities. PT with labored breathing, has accessory muscle use.

## 2015-04-29 NOTE — Progress Notes (Signed)
Orthopedic Tech Progress Note Patient Details:  Casey Grimes 04/11/1929 [id number]  Ortho Devices Type of Ortho Device: Postop shoe/boot Ortho Device/Splint Location: (B) LE prafo boots Ortho Device/Splint Interventions: Ordered, Application   Jennye Moccasin 04/29/2015, 4:14 PM
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
## 2015-04-29 NOTE — Progress Notes (Signed)
Pre visit review using our clinic review tool, if applicable. No additional management support is needed unless otherwise documented below in the visit note. 

## 2015-04-29 NOTE — Patient Instructions (Signed)
  We have reviewed your prior records including labs and tests today.  Your shortness of breath and fluid retention are getting worse and as we discussed you need to go to the hospital for further treatment.  We will call them and let them know you are coming.

## 2015-04-29 NOTE — ED Notes (Signed)
Attempted report 
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
## 2015-04-30 ENCOUNTER — Telehealth (HOSPITAL_COMMUNITY): Payer: Self-pay | Admitting: Surgery

## 2015-04-30 ENCOUNTER — Inpatient Hospital Stay (HOSPITAL_COMMUNITY): Payer: Medicare Other

## 2015-04-30 DIAGNOSIS — I5043 Acute on chronic combined systolic (congestive) and diastolic (congestive) heart failure: Secondary | ICD-10-CM

## 2015-04-30 DIAGNOSIS — I509 Heart failure, unspecified: Secondary | ICD-10-CM

## 2015-04-30 LAB — BASIC METABOLIC PANEL
ANION GAP: 14 (ref 5–15)
BUN: 75 mg/dL — ABNORMAL HIGH (ref 6–20)
CALCIUM: 9.1 mg/dL (ref 8.9–10.3)
CO2: 21 mmol/L — ABNORMAL LOW (ref 22–32)
CREATININE: 4.09 mg/dL — AB (ref 0.61–1.24)
Chloride: 107 mmol/L (ref 101–111)
GFR calc Af Amer: 14 mL/min — ABNORMAL LOW (ref >60)
GFR calc non Af Amer: 12 mL/min — ABNORMAL LOW (ref >60)
Glucose, Bld: 111 mg/dL — ABNORMAL HIGH (ref 65–99)
POTASSIUM: 4.6 mmol/L (ref 3.5–5.1)
SODIUM: 142 mmol/L (ref 135–145)

## 2015-04-30 LAB — CBC
HCT: 28.5 % — ABNORMAL LOW (ref 39.0–52.0)
Hemoglobin: 9.2 g/dL — ABNORMAL LOW (ref 13.0–17.0)
MCH: 27.6 pg (ref 26.0–34.0)
MCHC: 32.3 g/dL (ref 30.0–36.0)
MCV: 85.6 fL (ref 78.0–100.0)
PLATELETS: 260 10*3/uL (ref 150–400)
RBC: 3.33 MIL/uL — AB (ref 4.22–5.81)
RDW: 16.4 % — ABNORMAL HIGH (ref 11.5–15.5)
WBC: 5.7 10*3/uL (ref 4.0–10.5)

## 2015-04-30 LAB — GLUCOSE, CAPILLARY
GLUCOSE-CAPILLARY: 102 mg/dL — AB (ref 65–99)
GLUCOSE-CAPILLARY: 181 mg/dL — AB (ref 65–99)
Glucose-Capillary: 187 mg/dL — ABNORMAL HIGH (ref 65–99)
Glucose-Capillary: 131 mg/dL — ABNORMAL HIGH (ref 65–99)

## 2015-04-30 MED ORDER — FUROSEMIDE 10 MG/ML IJ SOLN
120.0000 mg | Freq: Three times a day (TID) | INTRAVENOUS | Status: DC
  Administered 2015-04-30 – 2015-05-04 (×12): 120 mg via INTRAVENOUS
  Filled 2015-04-30 (×14): qty 12

## 2015-04-30 MED ORDER — METOLAZONE 2.5 MG PO TABS
2.5000 mg | ORAL_TABLET | Freq: Every day | ORAL | Status: DC
Start: 2015-04-30 — End: 2015-05-05
  Administered 2015-04-30 – 2015-05-05 (×6): 2.5 mg via ORAL
  Filled 2015-04-30 (×6): qty 1

## 2015-04-30 NOTE — Progress Notes (Signed)
Heart Failure Navigator Consult Note  Presentation: Casey Grimes presented with is a 81 y.o. male with 4 weeks of progressive shortness of breath. Constant. Getting worse. Patient describes worsening orthopnea over this same period of time. Patient with a known history of heart failure and was seeing his primary care physician to try manage his increasing edema and shortness of breath which was felt to be due to worsening heart failure. Patient's home Lasix of 160 mg by mouth was doubled to try and improve diuresis but there is no change in urine output. Since patient's appointment on 04/17/2015 he is added an additional 4 pounds of weight despite the increased diuretic attempts. Additionally patient's renal function has been noted to have worsened and has gone from 3.17 to 4.3.  Past Medical History  Diagnosis Date  . Gout   . Hypertension   . Impotence   . Former smoker     quit 1988  . Depression   . Hyperlipidemia   . Osteoarthritis   . Type II diabetes mellitus (HCC)   . Diabetic peripheral neuropathy (HCC)   . Respiratory failure with hypoxia (HCC) 07/23/2013  . CVA (cerebral vascular accident) (HCC) 1980's  . Chronic lower back pain   . Dementia   . CHF (congestive heart failure) (HCC)     Hattie Perch 04/29/2015  . PND (paroxysmal nocturnal dyspnea)     Hattie Perch 04/29/2015  . [name]     Hattie Perch 04/29/2015  . Chronic kidney disease (CKD), stage IV (severe) (HCC)        with near end-stage kidney disease/notes 04/29/2015  . Acute on chronic renal failure (HCC)     Hattie Perch 04/29/2015    Social History   Social History  . Marital Status: Married    Spouse Name: Adela Lank  . Number of Children: 1  . Years of Education: 6   Occupational History  . retired    Social History Main Topics  . Smoking status: Former Smoker -- 1.00 packs/day for 40 years    Types: Cigarettes    Quit date: 07/19/1986  . Smokeless tobacco: Never Used  . Alcohol Use: Yes     Comment: 07/23/2012  "stopped drinking ~ 30 yr ago; sometimes drank too much"  . Drug Use: No  . Sexual Activity: No   Other Topics Concern  . None   Social History Narrative   6th grade, married - >50 years, 1 son, others (?). Lives with his wife. Work - retired and disabled.   Patient lives at [facility].   Caffeine Use: none    ECHO:Study Conclusions--07/17/14  - Left ventricle: The cavity size was normal. Wall thickness was increased in a pattern of mild LVH. Systolic function was normal. The estimated ejection fraction was in the range of 50% to 55%. Wall motion was normal; there were no regional wall motion abnormalities. Doppler parameters are consistent with abnormal left ventricular relaxation (grade 1 diastolic dysfunction). - Mitral valve: Calcified annulus. - Left atrium: The atrium was mildly dilated.  Transthoracic echocardiography. M-mode, complete 2D, spectral Doppler, and color Doppler. Birthdate: Patient birthdate: [birth year]. Age: Patient is 81 yr old. Sex: Gender: male. BMI: 28.5 kg/m^2. Blood pressure:   170/69 Patient status: Inpatient. Study date: Study date: 07/17/2014. Study time: 10:35 AM. Location: Bedside.  BNP    Component Value Date/Time   BNP 311.2* 04/29/2015 1100    ProBNP    Component Value Date/Time   PROBNP 1745.0* 07/24/2013 0705     Education Assessment  and Provision:  Detailed education and instructions provided on heart failure disease management including the following:  Signs and symptoms of Heart Failure When to call the physician Importance of daily weights Low sodium diet Fluid restriction Medication management Anticipated future follow-up appointments  Patient education given on each of the above topics.  Patient acknowledges understanding and acceptance of all instructions.  I spoke brielfy with Mr. Clelia SchaumannMcGirt regarding his HF.  He tells me that he "doesn't know much" regarding his health or HF.   He does say that he weighs daily--yet couldn't say when he would be prompted to contact a physician related to his weights at home.  He says that his wife helps him at home.  I will come back by in hopes to catch his wife and speak to her regarding his HF.    Education Materials:  "Living Better With Heart Failure" Booklet, Daily Weight Tracker Tool   High Risk Criteria for Readmission and/or Poor Patient Outcomes:   EF <30%- 50-55% with grade 1 dias dys  2 or more admissions in 6 months- 2/6  Difficult social situation- no-lives with wife  Demonstrates medication noncompliance- no denies   Barriers of Care:  Knowledge and compliance--? Knowledge of wife  Discharge Planning:   Plans at this time to return home with wife.  ? Ability to provide self- care.

## 2015-04-30 NOTE — Progress Notes (Signed)
  Echocardiogram 2D Echocardiogram has been performed.  Leta JunglingCooper, Batsheva Stevick M 04/30/2015, 4:09 PM

## 2015-04-30 NOTE — Progress Notes (Signed)
[facility] PROGRESS NOTE  Casey Grimes VFI:[id number] DOB: 08-24-1928 DOA: 04/29/2015 PCP: Myrlene Broker, MD  Assessment/Plan: Principal Problem:   Acute on chronic combined systolic (congestive) and diastolic (congestive) heart failure (HCC) - pt on carvedilol and lasix, metolazone - monitor intake and output. Net output > 1242 on last check  Active Problems:   Diabetes mellitus with diabetic nephropathy (HCC) - diabetic diet - Continue    Essential hypertension - Pt currently on amlodipine, carvedilol, hydralazine    CKD (chronic kidney disease) stage 4, GFR 15-29 ml/min (HCC) - Serum creatinine trending down on last check    Diabetic peripheral neuropathy (HCC) - stable     Hyperlipidemia - stable on statin, no chest pain reported    CVA (cerebral vascular accident) (HCC) - continue statin and blood pressure medications - stable no new focal neurological deficits reported.    Acute renal failure superimposed on stage 4 chronic kidney disease (HCC) - S creatinine improving. Pt does not want any HD in the future as such will no consult Nephrology  Code Status: full Family Communication: discussed with patient directly Disposition Plan: Pending improvement in condition.   Consultants:  None  Procedures:  none  Antibiotics:  none   HPI/Subjective: Pt has no new complaints. No acute issues reported overnight  Objective: Filed Vitals:   04/30/15 1153  BP: 165/54  Pulse: 60  Temp: 97.9 F (36.6 C)  Resp: 18    Intake/Output Summary (Last 24 hours) at 04/30/15 1358 Last data filed at 04/30/15 1055  Gross per 24 hour  Intake   1084 ml  Output   2026 ml  Net   -942 ml   Filed Weights   04/29/15 1556 04/30/15 0455  Weight: 106.641 kg (235 lb 1.6 oz) 106.629 kg (235 lb 1.2 oz)    Exam:   General:  Pt in nad, alert and awake  Cardiovascular: rrr, no rubs  Respiratory: no increased wob, no wheezes  Abdomen: soft, ND,  NT  Musculoskeletal: no cyanosis or clubbing   Data Reviewed: Basic Metabolic Panel:  Recent Labs Lab 04/29/15 1110 04/29/15 1119 04/29/15 1620 04/30/15 0403  NA 139 143  --  142  K 4.5 4.5  --  4.6  CL 108 113*  --  107  CO2 22  --   --  21*  GLUCOSE 76 74  --  111*  BUN 74* 68*  --  75*  CREATININE 4.23* 4.30* 4.10* 4.09*  CALCIUM 9.2  --   --  9.1   Liver Function Tests: No results for input(s): AST, ALT, ALKPHOS, BILITOT, PROT, ALBUMIN in the last 168 hours. No results for input(s): LIPASE, AMYLASE in the last 168 hours. No results for input(s): AMMONIA in the last 168 hours. CBC:  Recent Labs Lab 04/29/15 1110 04/29/15 1119 04/29/15 1620 04/30/15 0403  WBC 6.8  --  7.5 5.7  NEUTROABS 4.3  --   --   --   HGB 9.9* 11.2* 10.4* 9.2*  HCT 30.6* 33.0* 32.5* 28.5*  MCV 85.2  --  84.6 85.6  PLT 275  --  303 260   Cardiac Enzymes: No results for input(s): CKTOTAL, CKMB, CKMBINDEX, TROPONINI in the last 168 hours. BNP (last 3 results)  Recent Labs  04/29/15 1100  BNP 311.2*    ProBNP (last 3 results) No results for input(s): PROBNP in the last 8760 hours.  CBG:  Recent Labs Lab 04/29/15 1708 04/29/15 2045 04/30/15 0555 04/30/15 1112  GLUCAP 85  101* 102* 181*    No results found for this or any previous visit (from the past 240 hour(s)).   Studies: Dg Chest Port 1 View  04/29/2015  CLINICAL DATA:  Shortness of breath for 1 month EXAM: PORTABLE CHEST 1 VIEW COMPARISON:  Nov 22, 2014 FINDINGS: There is bilateral lower lobe interstitial edema. There is no airspace consolidation. Heart is upper normal in size with pulmonary vascularity within normal limits. No adenopathy. No bone lesions. IMPRESSION: Bilateral lower lobe interstitial edema. Question a degree of congestive heart failure. No airspace consolidation. Heart upper normal in size. Electronically Signed   By: Bretta Bang III M.D.   On: 04/29/2015 12:49    Scheduled Meds: . allopurinol  150  mg Oral Daily  . amLODipine  10 mg Oral Daily  . aspirin EC  325 mg Oral Daily  . atorvastatin  10 mg Oral q1800  . calcitRIOL  0.25 mcg Oral Daily  . carvedilol  25 mg Oral BID  . cloNIDine  0.2 mg Oral BID  . furosemide  120 mg Intravenous 3 times per day  . heparin  5,000 Units Subcutaneous 3 times per day  . hydrALAZINE  50 mg Oral QID  . insulin aspart  0-20 Units Subcutaneous TID WC  . insulin glargine  20 Units Subcutaneous QHS  . metolazone  2.5 mg Oral Daily  . potassium chloride SA  20 mEq Oral Daily  . sodium chloride  3 mL Intravenous Q12H  . sodium chloride  3 mL Intravenous Q12H   Continuous Infusions:   Time spent: > 35 minutes  Penny Pia  [facility] Pager [phone]  If 7PM-7AM, please contact night-coverage at [email or web address], password [facility] 04/30/2015, 1:58 PM  LOS: 1 day

## 2015-04-30 NOTE — Progress Notes (Signed)
Utilization review completed. Chriss Mannan, RN, BSN. 

## 2015-04-30 NOTE — Progress Notes (Signed)
       Patient Name: Casey Grimes Date of Encounter: 04/30/2015    SUBJECTIVE: He feels there has been no significant change over night.  TELEMETRY:   Sinus bradycardia Filed Vitals:   04/30/15 0000 04/30/15 0455 04/30/15 0834 04/30/15 1153  BP: 168/76 178/68 163/59 165/54  Pulse: 74 58 63 60  Temp: 98.6 F (37 C) 98.2 F (36.8 C) 97.9 F (36.6 C) 97.9 F (36.6 C)  TempSrc: Oral Oral Oral Oral  Resp: 18 17 18 18   Height:      Weight:  235 lb 1.2 oz (106.629 kg)    SpO2: 100% 100% 100% 97%    Intake/Output Summary (Last 24 hours) at 04/30/15 1154 Last data filed at 04/30/15 1055  Gross per 24 hour  Intake   1084 ml  Output   2326 ml  Net  -1242 ml   LABS: Basic Metabolic Panel:  Recent Labs  40/98/1109/06/03 1110 04/29/15 1119 04/29/15 1620 04/30/15 0403  NA 139 143  --  142  K 4.5 4.5  --  4.6  CL 108 113*  --  107  CO2 22  --   --  21*  GLUCOSE 76 74  --  111*  BUN 74* 68*  --  75*  CREATININE 4.23* 4.30* 4.10* 4.09*  CALCIUM 9.2  --   --  9.1   CBC:  Recent Labs  04/29/15 1110  04/29/15 1620 04/30/15 0403  WBC 6.8  --  7.5 5.7  NEUTROABS 4.3  --   --   --   HGB 9.9*  < > 10.4* 9.2*  HCT 30.6*  < > 32.5* 28.5*  MCV 85.2  --  84.6 85.6  PLT 275  --  303 260  < > = values in this interval not displayed.   Radiology/Studies:  CXR: IMPRESSION: Bilateral lower lobe interstitial edema. Question a degree of congestive heart failure. No airspace consolidation. Heart upper normal in size.   Physical Exam: Blood pressure 165/54, pulse 60, temperature 97.9 F (36.6 C), temperature source Oral, resp. rate 18, height 5\' 11"  (1.803 m), weight 235 lb 1.2 oz (106.629 kg), SpO2 97 %. Weight change:   Wt Readings from Last 3 Encounters:  04/30/15 235 lb 1.2 oz (106.629 kg)  04/29/15 240 lb (108.863 kg)  04/17/15 234 lb 1.9 oz (106.196 kg)    Elevated neck veins. Decreased basal breath sounds 2-3+ edema  ASSESSMENT:  1. Biventricular CHF due to acute  on chronic diastolic heart failure. 2. Marked elevation in systemic pressure. 3. Anemia developing despite diuresis  Plan:  1. More aggressive diuresis  Signed, Lesleigh NoeSMITH III,HENRY W 04/30/2015, 11:54 AM

## 2015-04-30 NOTE — Telephone Encounter (Signed)
error 

## 2015-04-30 NOTE — Evaluation (Signed)
Physical Therapy Evaluation Patient Details Name: Casey Grimes MRN: [id number] DOB: June 20, 1929 Today's Date: 04/30/2015   History of Present Illness  Casey Grimes is a 81 y.o. male with 4 weeks of progressive shortness of breath. Constant. Getting worse. Patient describes worsening orthopnea over this same period of time. PMH: CVA, dementia, depression, HTN  Clinical Impression  Patient demonstrates deficits in functional mobility as indicated below. Will need continued skilled PT to address deficits and maximize function. Will see as indicated and progress as tolerated. May consider HHPT if patient deconditions during hospital stay.    Follow Up Recommendations Supervision/Assistance - 24 hour;Supervision for mobility/OOB    Equipment Recommendations  None recommended by PT    Recommendations for Other Services       Precautions / Restrictions Precautions Precautions: Fall Restrictions Weight Bearing Restrictions: No      Mobility  Bed Mobility Overal bed mobility: Needs Assistance Bed Mobility: Supine to Sit     Supine to sit: Supervision     General bed mobility comments: HOB slightly elevated and use of bed rails to assist with sitting EOB  Transfers Overall transfer level: Needs assistance Equipment used: Rolling walker (2 wheeled) Transfers: Sit to/from Stand Sit to Stand: Min assist;+2 safety/equipment         General transfer comment: Cues for hand placement and technique.   Ambulation/Gait Ambulation/Gait assistance: Min guard Ambulation Distance (Feet): 140 Feet Assistive device: Rolling walker (2 wheeled) Gait Pattern/deviations: Step-through pattern;Decreased stride length;Trunk flexed Gait velocity: decreased Gait velocity interpretation: Below normal speed for age/gender General Gait Details: no opvert LOB noted mobilizing well on room air with saturations stable, no noted SOB  Stairs            Wheelchair Mobility    Modified  Rankin (Stroke Patients Only)       Balance Overall balance assessment: Needs assistance Sitting-balance support: No upper extremity supported;Feet supported Sitting balance-Leahy Scale: Good     Standing balance support: Bilateral upper extremity supported;During functional activity Standing balance-Leahy Scale: Fair                               Pertinent Vitals/Pain Pain Assessment: No/denies pain    Home Living Family/patient expects to be discharged to:: Private residence Living Arrangements: Spouse/significant other;Children Available Help at Discharge: Family;Available 24 hours/day Type of Home: House Home Access: Stairs to enter Entrance Stairs-Rails:  ("yes") Secretary/administrator of Steps: ~6 Home Layout: One level Home Equipment: Environmental consultant - 2 wheels      Prior Function Level of Independence: Needs assistance   Gait / Transfers Assistance Needed: used RW  ADL's / Homemaking Assistance Needed: "if needed" but can do it by himself  Comments: "Help me with anything I ask them to do"     Hand Dominance   Dominant Hand: Right    Extremity/Trunk Assessment   Upper Extremity Assessment: RUE deficits/detail;Generalized weakness RUE Deficits / Details: Decreased coordination noted during functional tasks, however MMT within functional/normal limits          Lower Extremity Assessment: Defer to PT evaluation      Cervical / Trunk Assessment: Normal  Communication   Communication: Expressive difficulties (h/o CVA)  Cognition Arousal/Alertness: Awake/alert Behavior During Therapy: WFL for tasks assessed/performed Overall Cognitive Status: History of cognitive impairments - at baseline (h/o dementia and CVA; pt oriented )  General Comments      Exercises        Assessment/Plan    PT Assessment Patient needs continued PT services  PT Diagnosis Difficulty walking;Generalized weakness   PT Problem List  Decreased strength;Decreased activity tolerance;Decreased balance;Decreased mobility;Decreased cognition;Decreased safety awareness;Cardiopulmonary status limiting activity  PT Treatment Interventions DME instruction;Gait training;Stair training;Functional mobility training;Therapeutic activities;Therapeutic exercise;Balance training;Patient/family education   PT Goals (Current goals can be found in the Care Plan section) Acute Rehab PT Goals Patient Stated Goal: none stated PT Goal Formulation: With patient Time For Goal Achievement: 05/14/15 Potential to Achieve Goals: Good    Frequency Min 3X/week   Barriers to discharge        Co-evaluation PT/OT/SLP Co-Evaluation/Treatment: Yes Reason for Co-Treatment: For patient/therapist safety PT goals addressed during session: Mobility/safety with mobility OT goals addressed during session: ADL's and self-care       End of Session Equipment Utilized During Treatment: Gait belt Activity Tolerance: Patient tolerated treatment well Patient left: in chair;with call bell/phone within reach;with chair alarm set Nurse Communication: Mobility status         Time: 1610-9604 PT Time Calculation (min) (ACUTE ONLY): 21 min   Charges:   PT Evaluation $Initial PT Evaluation Tier I: 1 Procedure     PT G CodesFabio Grimes 2015/05/15, 3:26 PM Casey Grimes, PT DPT  [phone]

## 2015-04-30 NOTE — Evaluation (Signed)
Occupational Therapy Evaluation Patient Details Name: Casey Grimes MRN: [id number] DOB: 1929/04/05 Today's Date: 04/30/2015    History of Present Illness Casey Grimes is a 81 y.o. male with 4 weeks of progressive shortness of breath. Constant. Getting worse. Patient describes worsening orthopnea over this same period of time. PMH: CVA, dementia, depression, HTN   Clinical Impression   Patient presenting with deconditioning secondary to above. Patient overall mod I PTA. Patient currently functioning at a min to mod assist level. Patient will benefit from acute OT to increase overall independence in the areas of ADLs, functional mobility, and overall safety in order to safely discharge home with family assisting.     Follow Up Recommendations  No OT follow up;Supervision/Assistance - 24 hour    Equipment Recommendations  None recommended by OT    Recommendations for Other Services  None at this time  Precautions / Restrictions Precautions Precautions: Fall Restrictions Weight Bearing Restrictions: No    Mobility Bed Mobility Overal bed mobility: Needs Assistance Bed Mobility: Supine to Sit     Supine to sit: Supervision     General bed mobility comments: HOB slightly elevated and use of bed rails to assist with sitting EOB  Transfers Overall transfer level: Needs assistance Equipment used: Rolling walker (2 wheeled) Transfers: Sit to/from Stand Sit to Stand: Min assist;+2 safety/equipment General transfer comment: Cues for hand placement and technique.     Balance Overall balance assessment: Needs assistance Sitting-balance support: No upper extremity supported;Feet supported Sitting balance-Leahy Scale: Good     Standing balance support: Bilateral upper extremity supported;During functional activity Standing balance-Leahy Scale: Fair    ADL Overall ADL's : Needs assistance/impaired Eating/Feeding: Set up;Sitting   Grooming: Set up;Sitting   Upper Body  Bathing: Set up;Sitting   Lower Body Bathing: Moderate assistance;Sit to/from stand   Upper Body Dressing : Set up;Sitting   Lower Body Dressing: Moderate assistance;Sit to/from stand   Toilet Transfer: Minimal assistance;RW;BSC   Toileting- Architect and Hygiene: Sit to/from stand;Min guard         General ADL Comments: Pt unable to reach BLEs for LB ADLs, pt reports wife assists with this at home; unsure of reliability of this. Pt stood from EOB and ambulated to [facility]. Pt stood from [facility] for peri cleaning. Pt then ambulated down hallway with physical therapy. Left pt seated in recliner.     Pertinent Vitals/Pain Pain Assessment: No/denies pain     Hand Dominance Right   Extremity/Trunk Assessment Upper Extremity Assessment Upper Extremity Assessment: RUE deficits/detail;Generalized weakness RUE Deficits / Details: Decreased coordination noted during functional tasks, however MMT within functional/normal limits    Lower Extremity Assessment Lower Extremity Assessment: Defer to PT evaluation   Cervical / Trunk Assessment Cervical / Trunk Assessment: Normal   Communication Communication Communication: Expressive difficulties (h/o CVA)   Cognition Arousal/Alertness: Awake/alert Behavior During Therapy: WFL for tasks assessed/performed Overall Cognitive Status: History of cognitive impairments - at baseline (h/o dementia and CVA; pt oriented )              Home Living Family/patient expects to be discharged to:: Private residence Living Arrangements: Spouse/significant other;Children Available Help at Discharge: Family;Available 24 hours/day Type of Home: House Home Access: Stairs to enter [employer] of Steps: ~6 Entrance Stairs-Rails:  ("yes") Home Layout: One level     Bathroom Shower/Tub: Tub/shower unit;Door   Bathroom Toilet: Standard     Home Equipment: Walker - 2 wheels   Prior Functioning/Environment Level of Independence: Needs  assistance  Gait / Transfers Assistance Needed: used RW ADL's / Homemaking Assistance Needed: "if needed" but can do it by himself   Comments: "Help me with anything I ask them to do"    OT Diagnosis: Generalized weakness   OT Problem List: Decreased strength;Decreased activity tolerance;Impaired balance (sitting and/or standing);Decreased safety awareness;Decreased knowledge of use of DME or AE   OT Treatment/Interventions: Self-care/ADL training;Therapeutic exercise;Energy conservation;DME and/or AE instruction;Therapeutic activities;Patient/family education;Balance training    OT Goals(Current goals can be found in the care plan section) Acute Rehab OT Goals Patient Stated Goal: none stated OT Goal Formulation: With patient Time For Goal Achievement: 05/14/15 Potential to Achieve Goals: Good ADL Goals Pt Will Perform Grooming: with modified independence;standing Pt Will Transfer to Toilet: with modified independence;ambulating;bedside commode Pt Will Perform Tub/Shower Transfer: Tub transfer;ambulating;rolling walker Additional ADL Goal #1: Pt will be mod I using RW for functional mobility  Additional ADL Goal #2: Pt will identify at least 3 energy conservation techniques to increase overall independence and safety   OT Frequency: Min 2X/week   Barriers to D/C: None known at this time       Co-evaluation PT/OT/SLP Co-Evaluation/Treatment: Yes Reason for Co-Treatment: For patient/therapist safety   OT goals addressed during session: ADL's and self-care      End of Session Equipment Utilized During Treatment: Rolling walker  Activity Tolerance: Patient tolerated treatment well Patient left: in chair;with call bell/phone within reach;with chair alarm set   Time: 4034-7425 OT Time Calculation (min): 23 min Charges:  OT General Charges $OT Visit: 1 Procedure OT Evaluation $Initial OT Evaluation Tier I: 1 Procedure  Beatrix Breece , MS, OTR/L, CLT Pager: [phone]   04/30/2015, 3:17 PM

## 2015-05-01 LAB — BASIC METABOLIC PANEL
ANION GAP: 9 (ref 5–15)
BUN: 69 mg/dL — ABNORMAL HIGH (ref 6–20)
CHLORIDE: 103 mmol/L (ref 101–111)
CO2: 25 mmol/L (ref 22–32)
Calcium: 8.9 mg/dL (ref 8.9–10.3)
Creatinine, Ser: 4.2 mg/dL — ABNORMAL HIGH (ref 0.61–1.24)
GFR calc non Af Amer: 12 mL/min — ABNORMAL LOW (ref >60)
GFR, EST AFRICAN AMERICAN: 14 mL/min — AB (ref >60)
Glucose, Bld: 64 mg/dL — ABNORMAL LOW (ref 65–99)
POTASSIUM: 3.7 mmol/L (ref 3.5–5.1)
Sodium: 137 mmol/L (ref 135–145)

## 2015-05-01 LAB — GLUCOSE, CAPILLARY
GLUCOSE-CAPILLARY: 117 mg/dL — AB (ref 65–99)
GLUCOSE-CAPILLARY: 182 mg/dL — AB (ref 65–99)
GLUCOSE-CAPILLARY: 164 mg/dL — AB (ref 65–99)
GLUCOSE-CAPILLARY: 121 mg/dL — AB (ref 65–99)
Glucose-Capillary: 62 mg/dL — ABNORMAL LOW (ref 65–99)

## 2015-05-01 NOTE — Care Management Note (Signed)
Case Management Note  Patient Details  Name: Casey Grimes MRN: [id number] Date of Birth: 05/13/1929  Subjective/Objective:       Admitted with CHF             Action/Plan: Patient lives at home with spouse, she does all of the cooking - heart healthy diet. Rolling walker at home. Has private insurance with [facility] with prescription drug coverage and does not have any problem getting his medication. [facility] choice [facility]. He has scales at home and stated that he weighs himself daily. Patient could benefit from a Disease Management Program for CHF. [facility] choices offered, [facility]. Attending MD please enter the face to face [facility] at discharge.  Expected Discharge Date:     Possibly 05/03/2015             Expected Discharge Plan:  Home w Home Health Services  Discharge planning Services  CM Consult Choice offered to:  Patient  HH Arranged:  RN, Disease Management, PT HH Agency:  [facility]  Status of Service:  In process, will continue to follow   Reola MosherChandler, Cyndia Degraff L, RN,MHA,BSN [phone] 05/01/2015, 2:53 PM

## 2015-05-01 NOTE — Progress Notes (Signed)
Hypoglycemic Event  CBG: 62  Treatment: orange juice  Symptoms: none Follow-up CBG: Time: CBG Result:  Possible Reasons for Event: diet/meds   Comments/MD notified:    Troy SineWalker, Coley Kulikowski M

## 2015-05-01 NOTE — Progress Notes (Signed)
       Patient Name: Casey Grimes Date of Encounter: 05/01/2015    SUBJECTIVE: Sitting at the sink in his room sitting on potty. Feels somewhat better. Says swelling is somewhat improved.  TELEMETRY:  Sinus rhythm at 60. Filed Vitals:   04/30/15 2141 05/01/15 0550 05/01/15 0900 05/01/15 0920  BP: 176/61 182/61 169/56   Pulse: 60 62 65 65  Temp: 99 F (37.2 C) 98.6 F (37 C) 98.6 F (37 C)   TempSrc: Oral Oral Oral   Resp: 18 18    Height:      Weight:  226 lb 0.6 oz (102.531 kg)    SpO2: 100% 98% 100% 98%    Intake/Output Summary (Last 24 hours) at 05/01/15 0954 Last data filed at 05/01/15 0816  Gross per 24 hour  Intake   1064 ml  Output   3750 ml  Net  -2686 ml   LABS: Basic Metabolic Panel:  Recent Labs  16/04/9609/12/16 0403 05/01/15 0500  NA 142 137  K 4.6 3.7  CL 107 103  CO2 21* 25  GLUCOSE 111* 64*  BUN 75* 69*  CREATININE 4.09* 4.20*  CALCIUM 9.1 8.9   CBC:  Recent Labs  04/29/15 1110  04/29/15 1620 04/30/15 0403  WBC 6.8  --  7.5 5.7  NEUTROABS 4.3  --   --   --   HGB 9.9*  < > 10.4* 9.2*  HCT 30.6*  < > 32.5* 28.5*  MCV 85.2  --  84.6 85.6  PLT 275  --  303 260  < > = values in this interval not displayed.  Radiology/Studies:  No new data  Physical Exam: Blood pressure 169/56, pulse 65, temperature 98.6 F (37 C), temperature source Oral, resp. rate 18, height 5\' 11"  (1.803 m), weight 226 lb 0.6 oz (102.531 kg), SpO2 98 %. Weight change: -9 lb 1 oz (-4.11 kg)  Wt Readings from Last 3 Encounters:  05/01/15 226 lb 0.6 oz (102.531 kg)  04/29/15 240 lb (108.863 kg)  04/17/15 234 lb 1.9 oz (106.196 kg)   2+ bilateral ankle to knee edema Decreased breath sounds at both bases. Elevated neck veins.  ASSESSMENT:  1. Acute on chronic diastolic heart failure with volume overload, right heart greater than left heart. Elevated BNP on admission 2. Chronic kidney disease, stage V. Function is stable despite the diuresis. 3. Anemia, likely  chronic kidney disease related.  Plan:  Still volume overloaded. Continue high-dose IV Lasix and metolazone at least one more day. Consider Lasix drip. Monitor kidney function.  Selinda EonSigned, SMITH III,HENRY W 05/01/2015, 9:54 AM

## 2015-05-01 NOTE — Progress Notes (Signed)
[facility] PROGRESS NOTE  Joen LauraLouis J Kassa ZOX:[id number] DOB: 03/11/1929 DOA: 04/29/2015 PCP: Myrlene BrokerElizabeth A Crawford, MD  Assessment/Plan: Principal Problem:   Acute on chronic combined systolic (congestive) and diastolic (congestive) heart failure (HCC) - pt on carvedilol and lasix, metolazone - monitor intake and output. Net output > 3L on last check  Active Problems:   Diabetes mellitus with diabetic nephropathy (HCC) - diabetic diet - Continue    Essential hypertension - Pt currently on amlodipine, carvedilol, hydralazine    CKD (chronic kidney disease) stage 4, GFR 15-29 ml/min (HCC) - Serum creatinine trending down on last check    Diabetic peripheral neuropathy (HCC) - stable     Hyperlipidemia - stable on statin, no chest pain reported    CVA (cerebral vascular accident) (HCC) - continue statin and blood pressure medications - stable no new focal neurological deficits reported.    Acute renal failure superimposed on stage 4 chronic kidney disease (HCC) - S creatinine stable. Pt does not want any HD in the future as such will not consult Nephrology  Code Status: full Family Communication: discussed with patient directly Disposition Plan: Pending improvement in condition.   Consultants:  None  Procedures:  none  Antibiotics:  none   HPI/Subjective: Pt has no new complaints. No acute issues reported overnight  Objective: Filed Vitals:   05/01/15 1154  BP: 154/57  Pulse: 60  Temp: 97.9 F (36.6 C)  Resp:     Intake/Output Summary (Last 24 hours) at 05/01/15 1245 Last data filed at 05/01/15 1031  Gross per 24 hour  Intake   1180 ml  Output   3450 ml  Net  -2270 ml   Filed Weights   04/29/15 1556 04/30/15 0455 05/01/15 0550  Weight: 106.641 kg (235 lb 1.6 oz) 106.629 kg (235 lb 1.2 oz) 102.531 kg (226 lb 0.6 oz)    Exam:   General:  Pt in nad, alert and awake  Cardiovascular: rrr, no rubs  Respiratory: no increased wob, no  wheezes  Abdomen: soft, ND, NT  Musculoskeletal: no cyanosis or clubbing   Data Reviewed: Basic Metabolic Panel:  Recent Labs Lab 04/29/15 1110 04/29/15 1119 04/29/15 1620 04/30/15 0403 05/01/15 0500  NA 139 143  --  142 137  K 4.5 4.5  --  4.6 3.7  CL 108 113*  --  107 103  CO2 22  --   --  21* 25  GLUCOSE 76 74  --  111* 64*  BUN 74* 68*  --  75* 69*  CREATININE 4.23* 4.30* 4.10* 4.09* 4.20*  CALCIUM 9.2  --   --  9.1 8.9   Liver Function Tests: No results for input(s): AST, ALT, ALKPHOS, BILITOT, PROT, ALBUMIN in the last 168 hours. No results for input(s): LIPASE, AMYLASE in the last 168 hours. No results for input(s): AMMONIA in the last 168 hours. CBC:  Recent Labs Lab 04/29/15 1110 04/29/15 1119 04/29/15 1620 04/30/15 0403  WBC 6.8  --  7.5 5.7  NEUTROABS 4.3  --   --   --   HGB 9.9* 11.2* 10.4* 9.2*  HCT 30.6* 33.0* 32.5* 28.5*  MCV 85.2  --  84.6 85.6  PLT 275  --  303 260   Cardiac Enzymes: No results for input(s): CKTOTAL, CKMB, CKMBINDEX, TROPONINI in the last 168 hours. BNP (last 3 results)  Recent Labs  04/29/15 1100  BNP 311.2*    ProBNP (last 3 results) No results for input(s): PROBNP in the last 8760 hours.  CBG:  Recent Labs Lab 04/30/15 1617 04/30/15 2144 05/01/15 0546 05/01/15 0652 05/01/15 1153  GLUCAP 187* 131* 62* 117* 182*    No results found for this or any previous visit (from the past 240 hour(s)).   Studies: No results found.  Scheduled Meds: . allopurinol  150 mg Oral Daily  . amLODipine  10 mg Oral Daily  . aspirin EC  325 mg Oral Daily  . atorvastatin  10 mg Oral q1800  . calcitRIOL  0.25 mcg Oral Daily  . carvedilol  25 mg Oral BID  . cloNIDine  0.2 mg Oral BID  . furosemide  120 mg Intravenous 3 times per day  . heparin  5,000 Units Subcutaneous 3 times per day  . hydrALAZINE  50 mg Oral QID  . insulin aspart  0-20 Units Subcutaneous TID WC  . insulin glargine  20 Units Subcutaneous QHS  .  metolazone  2.5 mg Oral Daily  . potassium chloride SA  20 mEq Oral Daily  . sodium chloride  3 mL Intravenous Q12H  . sodium chloride  3 mL Intravenous Q12H   Continuous Infusions:   Time spent: > 35 minutes  Penny Pia  [facility] Pager [phone]  If 7PM-7AM, please contact night-coverage at [email or web address], password [facility] 05/01/2015, 12:45 PM  LOS: 2 days

## 2015-05-01 NOTE — Progress Notes (Signed)
Occupational Therapy Treatment Patient Details Name: Casey Grimes MRN: [id number] DOB: 01/05/1929 Today's Date: 05/01/2015    History of present illness Casey Grimes is a 81 y.o. male with 4 weeks of progressive shortness of breath. Constant. Getting worse. Patient describes worsening orthopnea over this same period of time. PMH: CVA, dementia, depression, HTN   OT comments  Pt currently supervision level for mobility with use of the RW and for simulated toilet transfers.  Doing well do not anticipate any post acute OT needs after discharge.    Follow Up Recommendations  Supervision/Assistance - 24 hour;No OT follow up    Equipment Recommendations  None recommended by OT    Recommendations for Other Services      Precautions / Restrictions Precautions Precautions: Fall Restrictions Weight Bearing Restrictions: No       Mobility Bed Mobility                  Transfers Overall transfer level: Needs assistance Equipment used: Rolling walker (2 wheeled) Transfers: Stand Pivot Transfers Sit to Stand: Supervision Stand pivot transfers: Supervision            Balance     Sitting balance-Leahy Scale: Good       Standing balance-Leahy Scale: Fair                     ADL                               Toileting- ArchitectClothing Manipulation and Hygiene: Supervision/safety;Sit to/from stand       Functional mobility during ADLs: Supervision/safety;Rolling walker General ADL Comments: Pt supervision for mobility using the RW. Demonstrates trunk and cervical flexion while ambulating.  Pt declined performing toileting or grooming tasks.  Discussed need for shower seat of pt chooses to use the shower, however he reports just sponge bathing at home.  Ambulated approxiamtely 100 ft with RW and supervision.       Vision                     Perception     Praxis      Cognition   Behavior During Therapy: WFL for tasks  assessed/performed Overall Cognitive Status: History of cognitive impairments - at baseline                                    Pertinent Vitals/ Pain       Pain Assessment: No/denies pain         Frequency Min 2X/week     Progress Toward Goals  OT Goals(current goals can now be found in the care plan section)  Progress towards OT goals: Progressing toward goals     Plan Discharge plan remains appropriate       End of Session Equipment Utilized During Treatment: Rolling walker   Activity Tolerance Patient tolerated treatment well   Patient Left in chair;with call bell/phone within reach;with chair alarm set   Nurse Communication Other (comment) (Need for re-application for condom catheter )        Time: 4034-74250901-0918 OT Time Calculation (min): 17 min  Charges: OT General Charges $OT Visit: 1 Procedure OT Treatments $Self Care/Home Management : 8-22 mins  Kourtnie Sachs OTR/L 05/01/2015, 9:25 AM

## 2015-05-02 DIAGNOSIS — N179 Acute kidney failure, unspecified: Secondary | ICD-10-CM

## 2015-05-02 DIAGNOSIS — I5032 Chronic diastolic (congestive) heart failure: Secondary | ICD-10-CM

## 2015-05-02 LAB — BASIC METABOLIC PANEL
ANION GAP: 14 (ref 5–15)
BUN: 76 mg/dL — ABNORMAL HIGH (ref 6–20)
CO2: 25 mmol/L (ref 22–32)
Calcium: 9 mg/dL (ref 8.9–10.3)
Chloride: 98 mmol/L — ABNORMAL LOW (ref 101–111)
Creatinine, Ser: 4.35 mg/dL — ABNORMAL HIGH (ref 0.61–1.24)
GFR, EST AFRICAN AMERICAN: 13 mL/min — AB (ref >60)
GFR, EST NON AFRICAN AMERICAN: 11 mL/min — AB (ref >60)
GLUCOSE: 130 mg/dL — AB (ref 65–99)
POTASSIUM: 3.6 mmol/L (ref 3.5–5.1)
Sodium: 137 mmol/L (ref 135–145)

## 2015-05-02 LAB — GLUCOSE, CAPILLARY
GLUCOSE-CAPILLARY: 187 mg/dL — AB (ref 65–99)
Glucose-Capillary: 131 mg/dL — ABNORMAL HIGH (ref 65–99)
Glucose-Capillary: 177 mg/dL — ABNORMAL HIGH (ref 65–99)
Glucose-Capillary: 236 mg/dL — ABNORMAL HIGH (ref 65–99)

## 2015-05-02 NOTE — Care Management Important Message (Signed)
Important Message  Patient Details  Name: Casey Grimes J Every MRN: [id number] Date of Birth: 03/01/1929   Medicare Important Message Given:  Yes-second notification given    Orson AloeMegan P Rolin Schult 05/02/2015, 11:02 AM

## 2015-05-02 NOTE — Progress Notes (Signed)
[facility] PROGRESS NOTE  Casey Grimes ZOX:[id number] DOB: 10/29/1928 DOA: 04/29/2015 PCP: Myrlene BrokerElizabeth A Crawford, MD  Assessment/Plan: Principal Problem:   Acute on chronic combined systolic (congestive) and diastolic (congestive) heart failure (HCC) - pt on carvedilol and lasix, metolazone - monitor intake and output. Net output > 5L on last check - cardiology assisting.  Active Problems:   Diabetes mellitus with diabetic nephropathy (HCC) - diabetic diet - Continue    Essential hypertension - Pt currently on amlodipine, carvedilol, hydralazine    CKD (chronic kidney disease) stage 4, GFR 15-29 ml/min (HCC) - Serum creatinine trending down on last check    Diabetic peripheral neuropathy (HCC) - stable     Hyperlipidemia - stable on statin, no chest pain reported    CVA (cerebral vascular accident) (HCC) - continue statin and blood pressure medications - stable no new focal neurological deficits reported.    Acute renal failure superimposed on stage 4 chronic kidney disease (HCC) - S creatinine stable. Pt does not want any HD in the future as such will not consult Nephrology  Code Status: full Family Communication: discussed with patient directly Disposition Plan: Pending improvement in condition.   Consultants:  None  Procedures:  none  Antibiotics:  none   HPI/Subjective: Pt has no new complaints. No acute issues reported overnight  Objective: Filed Vitals:   05/02/15 0635  BP: 185/62  Pulse: 69  Temp: 98.5 F (36.9 C)  Resp:     Intake/Output Summary (Last 24 hours) at 05/02/15 1146 Last data filed at 05/02/15 0845  Gross per 24 hour  Intake    890 ml  Output   2175 ml  Net  -1285 ml   Filed Weights   04/30/15 0455 05/01/15 0550 05/02/15 0635  Weight: 106.629 kg (235 lb 1.2 oz) 102.531 kg (226 lb 0.6 oz) 100.172 kg (220 lb 13.4 oz)    Exam:   General:  Pt in nad, alert and awake  Cardiovascular: rrr, no rubs  Respiratory:  no increased wob, no wheezes  Abdomen: soft, ND, NT  Musculoskeletal: no cyanosis or clubbing   Data Reviewed: Basic Metabolic Panel:  Recent Labs Lab 04/29/15 1110 04/29/15 1119 04/29/15 1620 04/30/15 0403 05/01/15 0500 05/02/15 0458  NA 139 143  --  142 137 137  K 4.5 4.5  --  4.6 3.7 3.6  CL 108 113*  --  107 103 98*  CO2 22  --   --  21* 25 25  GLUCOSE 76 74  --  111* 64* 130*  BUN 74* 68*  --  75* 69* 76*  CREATININE 4.23* 4.30* 4.10* 4.09* 4.20* 4.35*  CALCIUM 9.2  --   --  9.1 8.9 9.0   Liver Function Tests: No results for input(s): AST, ALT, ALKPHOS, BILITOT, PROT, ALBUMIN in the last 168 hours. No results for input(s): LIPASE, AMYLASE in the last 168 hours. No results for input(s): AMMONIA in the last 168 hours. CBC:  Recent Labs Lab 04/29/15 1110 04/29/15 1119 04/29/15 1620 04/30/15 0403  WBC 6.8  --  7.5 5.7  NEUTROABS 4.3  --   --   --   HGB 9.9* 11.2* 10.4* 9.2*  HCT 30.6* 33.0* 32.5* 28.5*  MCV 85.2  --  84.6 85.6  PLT 275  --  303 260   Cardiac Enzymes: No results for input(s): CKTOTAL, CKMB, CKMBINDEX, TROPONINI in the last 168 hours. BNP (last 3 results)  Recent Labs  04/29/15 1100  BNP 311.2*  ProBNP (last 3 results) No results for input(s): PROBNP in the last 8760 hours.  CBG:  Recent Labs Lab 05/01/15 0652 05/01/15 1153 05/01/15 1658 05/01/15 2102 05/02/15 0622  GLUCAP 117* 182* 164* 121* 131*    No results found for this or any previous visit (from the past 240 hour(s)).   Studies: No results found.  Scheduled Meds: . allopurinol  150 mg Oral Daily  . amLODipine  10 mg Oral Daily  . aspirin EC  325 mg Oral Daily  . atorvastatin  10 mg Oral q1800  . calcitRIOL  0.25 mcg Oral Daily  . carvedilol  25 mg Oral BID  . cloNIDine  0.2 mg Oral BID  . furosemide  120 mg Intravenous 3 times per day  . heparin  5,000 Units Subcutaneous 3 times per day  . hydrALAZINE  50 mg Oral QID  . insulin aspart  0-20 Units  Subcutaneous TID WC  . insulin glargine  20 Units Subcutaneous QHS  . metolazone  2.5 mg Oral Daily  . potassium chloride SA  20 mEq Oral Daily  . sodium chloride  3 mL Intravenous Q12H  . sodium chloride  3 mL Intravenous Q12H   Continuous Infusions:   Time spent: > 35 minutes  Penny Pia  [facility] Pager [phone]  If 7PM-7AM, please contact night-coverage at [email or web address], password [facility] 05/02/2015, 11:46 AM  LOS: 3 days

## 2015-05-02 NOTE — Progress Notes (Signed)
Physical Therapy Treatment Patient Details Name: Casey Grimes MRN: [id number] DOB: January 03, 1929 Today's Date: 05/02/2015    History of Present Illness Casey Grimes is a 81 y.o. male with 4 weeks of progressive shortness of breath. Constant. Getting worse. Patient describes worsening orthopnea over this same period of time. PMH: CVA, dementia, depression, HTN    PT Comments    Pt notes bilateral lower extremity pain/stiffness that he states is limiting his ability to move as well as he did yesterday.  Pt responded well to treatment and had no noted dyspnea with ambulation.  VSS throughout session on room air.  Pt requires further acute PT services to maximize safety and independence with functional mobility and to increase activity tolerance.  PT recommending HHPT and 24 hour supervision following discharge.  Follow Up Recommendations   Home health PT;Supervision/Assistance - 24 hour;Supervision for mobility/OOB     Equipment Recommendations  None recommended by PT    Recommendations for Other Services       Precautions / Restrictions Precautions Precautions: Fall Restrictions Weight Bearing Restrictions: No    Mobility  Bed Mobility               General bed mobility comments: pt received in chair  Transfers Overall transfer level: Needs assistance Equipment used: Rolling walker (2 wheeled) Transfers: Sit to/from Stand Sit to Stand: Min assist         General transfer comment: cues to scoot to edge of surface, for foot placement, and to push hips forawrd to come to stand; Min assist for initiation of standing  Ambulation/Gait Ambulation/Gait assistance: Min guard Ambulation Distance (Feet): 120 Feet Assistive device: Rolling walker (2 wheeled) Gait Pattern/deviations: Step-through pattern;Decreased stride length;Trunk flexed Gait velocity: decreased Gait velocity interpretation: Below normal speed for age/gender General Gait Details: no overt LOB noted  mobilizing well on room air with saturations and HR stable, no noted SOB; VCs for upright posture   Stairs            Wheelchair Mobility    Modified Rankin (Stroke Patients Only)       Balance Overall balance assessment: Needs assistance   Sitting balance-Leahy Scale: Good       Standing balance-Leahy Scale: Fair                      Cognition Arousal/Alertness: Awake/alert Behavior During Therapy: WFL for tasks assessed/performed Overall Cognitive Status: History of cognitive impairments - at baseline                      Exercises General Exercises - Lower Extremity Ankle Circles/Pumps: AROM;Both;10 reps;Seated Long Arc Quad: AROM;Both;10 reps;Seated Hip Flexion/Marching: AROM;Both;10 reps;Seated    General Comments General comments (skin integrity, edema, etc.): Pt notes no SOB and VSS throughout session, mostly limited by bilateral LE pain      Pertinent Vitals/Pain Pain Assessment: Faces Faces Pain Scale: Hurts little more Pain Location: bilateral legs Pain Descriptors / Indicators: Grimacing;Aching (rubbing both knees) Pain Intervention(s): Monitored during session    Home Living                      Prior Function            PT Goals (current goals can now be found in the care plan section) Acute Rehab PT Goals Patient Stated Goal: to go home PT Goal Formulation: With patient Time For Goal Achievement: 05/14/15 Potential to Achieve Goals: Good  Frequency  Min 3X/week    PT Plan Current plan remains appropriate    Co-evaluation             End of Session Equipment Utilized During Treatment: Gait belt Activity Tolerance: Patient tolerated treatment well Patient left: in chair;with call bell/phone within reach;with chair alarm set     Time: [phone] PT Time Calculation (min) (ACUTE ONLY): 22 min  Charges:  $Gait Training: 8-22 mins                    G Codes:      Yacoub Diltz 05/23/2015, 9:27  AM Arnoldo Morale, SPT

## 2015-05-02 NOTE — Progress Notes (Signed)
Subjective: Breathing better  Objective: Vital signs in last 24 hours: Temp:  [97.9 F (36.6 C)-98.6 F (37 C)] 98.5 F (36.9 C) (10/14 0635) Pulse Rate:  [60-69] 69 (10/14 0635) BP: (154-185)/(53-62) 185/62 mmHg (10/14 0635) SpO2:  [98 %-100 %] 100 % (10/14 0635) Weight:  [220 lb 13.4 oz (100.172 kg)] 220 lb 13.4 oz (100.172 kg) (10/14 16100635) Last BM Date: 05/01/15  Intake/Output from previous day: 10/13 0701 - 10/14 0700 In: 1010 [P.O.:700; IV Piggyback:310] Out: 3525 [Urine:3525] Intake/Output this shift: Total I/O In: 240 [P.O.:240] Out: -   Medications Scheduled Meds: . allopurinol  150 mg Oral Daily  . amLODipine  10 mg Oral Daily  . aspirin EC  325 mg Oral Daily  . atorvastatin  10 mg Oral q1800  . calcitRIOL  0.25 mcg Oral Daily  . carvedilol  25 mg Oral BID  . cloNIDine  0.2 mg Oral BID  . furosemide  120 mg Intravenous 3 times per day  . heparin  5,000 Units Subcutaneous 3 times per day  . hydrALAZINE  50 mg Oral QID  . insulin aspart  0-20 Units Subcutaneous TID WC  . insulin glargine  20 Units Subcutaneous QHS  . metolazone  2.5 mg Oral Daily  . potassium chloride SA  20 mEq Oral Daily  . sodium chloride  3 mL Intravenous Q12H  . sodium chloride  3 mL Intravenous Q12H   Continuous Infusions:  PRN Meds:.sodium chloride, acetaminophen **OR** acetaminophen, ondansetron **OR** ondansetron (ZOFRAN) IV, oxyCODONE, sodium chloride  PE: General appearance: alert, cooperative, no distress and Sitting up in chair. Neck: no JVD Lungs: clear to auscultation bilaterally Heart: regular rate and rhythm, S1, S2 normal, no murmur, click, rub or gallop Abdomen: +BS, nontender Extremities: trace to 1+ LEE Pulses: 2+ and symmetric Skin: Warm and dry Neurologic: Grossly normal  Lab Results:   Recent Labs  04/29/15 1110 04/29/15 1119 04/29/15 1620 04/30/15 0403  WBC 6.8  --  7.5 5.7  HGB 9.9* 11.2* 10.4* 9.2*  HCT 30.6* 33.0* 32.5* 28.5*  PLT 275  --   303 260   BMET  Recent Labs  04/30/15 0403 05/01/15 0500 05/02/15 0458  NA 142 137 137  K 4.6 3.7 3.6  CL 107 103 98*  CO2 21* 25 25  GLUCOSE 111* 64* 130*  BUN 75* 69* 76*  CREATININE 4.09* 4.20* 4.35*  CALCIUM 9.1 8.9 9.0     Assessment/Plan  1. Acute on chronic diastolic heart failure with volume overload, right heart greater than left heart. Elevated BNP on admission 2. Chronic kidney disease, stage V. Function is stable despite the diuresis. 3. Anemia, likely chronic kidney disease related.  Net fluids: -2.5L/-5.9L.  SCr trending up a little the last few days but overall stable.  Close to euvolemic.  May have a little more fluid to go.  Minimal LEE.  IV Lasix 120mg  Q8.  Metolazone 2.5.  He was not on metolazone at home but will likely need it at least PRN.  I would try torsemide at DC.  BP poorly controlled on amlodipine 10, coreg 25 bid, hydralazine 50 QID-- increase to 75, clonidine 0.2 bid.   LOS: 3 days    HAGER, BRYAN PA-C 05/02/2015 8:51 AM   Agree with note written by Jones SkeneBryan Hagar PAC  I/I neg 5.6 Liters on high dose Furosamide and zaroxolyn. Edema improving. Diastolic HF. CKD stage 4 with Scr in 4 range (Dr Ivar BuryPowel Nephrologist, not a HD candidate). Cont current Rx.  Still has a way to go. 2+ pitting edema.  Nanetta Batty 05/02/2015 11:31 AM

## 2015-05-03 LAB — BASIC METABOLIC PANEL
ANION GAP: 13 (ref 5–15)
BUN: 80 mg/dL — AB (ref 6–20)
CALCIUM: 8.6 mg/dL — AB (ref 8.9–10.3)
CO2: 25 mmol/L (ref 22–32)
Chloride: 97 mmol/L — ABNORMAL LOW (ref 101–111)
Creatinine, Ser: 4.57 mg/dL — ABNORMAL HIGH (ref 0.61–1.24)
GFR calc Af Amer: 12 mL/min — ABNORMAL LOW (ref >60)
GFR, EST NON AFRICAN AMERICAN: 11 mL/min — AB (ref >60)
GLUCOSE: 136 mg/dL — AB (ref 65–99)
Potassium: 3.5 mmol/L (ref 3.5–5.1)
Sodium: 135 mmol/L (ref 135–145)

## 2015-05-03 LAB — GLUCOSE, CAPILLARY
GLUCOSE-CAPILLARY: 212 mg/dL — AB (ref 65–99)
GLUCOSE-CAPILLARY: 234 mg/dL — AB (ref 65–99)
Glucose-Capillary: 139 mg/dL — ABNORMAL HIGH (ref 65–99)
Glucose-Capillary: 150 mg/dL — ABNORMAL HIGH (ref 65–99)

## 2015-05-03 NOTE — Progress Notes (Signed)
Patient Name: Casey Grimes Date of Encounter: 05/03/2015     Principal Problem:   Acute on chronic combined systolic (congestive) and diastolic (congestive) heart failure (HCC) Active Problems:   Diabetes mellitus with diabetic nephropathy (HCC)   Essential hypertension   CKD (chronic kidney disease) stage 4, GFR 15-29 ml/min (HCC)   Diabetic peripheral neuropathy (HCC)   Chronic diastolic heart failure (HCC)   Hyperlipidemia   CVA (cerebral vascular accident) (HCC)   Acute renal failure superimposed on stage 4 chronic kidney disease (HCC)    SUBJECTIVE  Patient denies any increased dyspnea.  No chest pain.  I and O- 2000 cc overnight.  Weight unchanged at 220. Telemetry shows normal sinus rhythm.  Blood pressure continues to run high  CURRENT MEDS . allopurinol  150 mg Oral Daily  . amLODipine  10 mg Oral Daily  . aspirin EC  325 mg Oral Daily  . atorvastatin  10 mg Oral q1800  . calcitRIOL  0.25 mcg Oral Daily  . carvedilol  25 mg Oral BID  . cloNIDine  0.2 mg Oral BID  . furosemide  120 mg Intravenous 3 times per day  . heparin  5,000 Units Subcutaneous 3 times per day  . hydrALAZINE  50 mg Oral QID  . insulin aspart  0-20 Units Subcutaneous TID WC  . insulin glargine  20 Units Subcutaneous QHS  . metolazone  2.5 mg Oral Daily  . potassium chloride SA  20 mEq Oral Daily  . sodium chloride  3 mL Intravenous Q12H  . sodium chloride  3 mL Intravenous Q12H    OBJECTIVE  Filed Vitals:   05/02/15 0635 05/02/15 1220 05/02/15 1956 05/03/15 0403  BP: 185/62 152/52 165/61 178/58  Pulse: 69 70 68 64  Temp: 98.5 F (36.9 C) 98.5 F (36.9 C) 99 F (37.2 C) 99 F (37.2 C)  TempSrc: Oral Oral Oral Oral  Resp:  18 18 18   Height:      Weight: 220 lb 13.4 oz (100.172 kg)   220 lb 0.3 oz (99.8 kg)  SpO2: 100% 97% 96% 98%    Intake/Output Summary (Last 24 hours) at 05/03/15 0954 Last data filed at 05/03/15 0923  Gross per 24 hour  Intake    786 ml  Output   2850  ml  Net  -2064 ml   Filed Weights   05/01/15 0550 05/02/15 0635 05/03/15 0403  Weight: 226 lb 0.6 oz (102.531 kg) 220 lb 13.4 oz (100.172 kg) 220 lb 0.3 oz (99.8 kg)    PHYSICAL EXAM  General: Pleasant, NAD. Neuro: Alert and oriented X 3. Moves all extremities spontaneously. Psych: Normal affect. HEENT:  Normal  Neck: Supple without bruits or JVD. Lungs:  Resp regular and unlabored, CTA. Heart: RRR no s3, s4, or murmurs. Abdomen: Soft, non-tender, non-distended, BS + x 4.  Extremities: Moderate pretibial edema bilaterally  Accessory Clinical Findings  CBC No results for input(s): WBC, NEUTROABS, HGB, HCT, MCV, PLT in the last 72 hours. Basic Metabolic Panel  Recent Labs  05/02/15 0458 05/03/15 0359  NA 137 135  K 3.6 3.5  CL 98* 97*  CO2 25 25  GLUCOSE 130* 136*  BUN 76* 80*  CREATININE 4.35* 4.57*  CALCIUM 9.0 8.6*   Liver Function Tests No results for input(s): AST, ALT, ALKPHOS, BILITOT, PROT, ALBUMIN in the last 72 hours. No results for input(s): LIPASE, AMYLASE in the last 72 hours. Cardiac Enzymes No results for input(s): CKTOTAL, CKMB, CKMBINDEX, TROPONINI in  the last 72 hours. BNP Invalid input(s): POCBNP D-Dimer No results for input(s): DDIMER in the last 72 hours. Hemoglobin A1C No results for input(s): HGBA1C in the last 72 hours. Fasting Lipid Panel No results for input(s): CHOL, HDL, LDLCALC, TRIG, CHOLHDL, LDLDIRECT in the last 72 hours. Thyroid Function Tests No results for input(s): TSH, T4TOTAL, T3FREE, THYROIDAB in the last 72 hours.  Invalid input(s): FREET3  TELE  Normal sinus rhythm  ECG    Radiology/Studies  Dg Chest Port 1 View  04/29/2015  CLINICAL DATA:  Shortness of breath for 1 month EXAM: PORTABLE CHEST 1 VIEW COMPARISON:  Nov 22, 2014 FINDINGS: There is bilateral lower lobe interstitial edema. There is no airspace consolidation. Heart is upper normal in size with pulmonary vascularity within normal limits. No adenopathy.  No bone lesions. IMPRESSION: Bilateral lower lobe interstitial edema. Question a degree of congestive heart failure. No airspace consolidation. Heart upper normal in size. Electronically Signed   By: Bretta Bang III M.D.   On: 04/29/2015 12:49    ASSESSMENT AND PLAN  1. Acute on chronic diastolic heart failure with volume overload, right heart greater than left heart. Elevated BNP on admission 2. Chronic kidney disease, stage V. Function is stable despite the diuresis.  Creatinine up slightly overnight. Not a candidate for hemodialysis. 3. Anemia, likely chronic kidney disease related.  Plan: Continue IV Lasix.  Continue Zaroxolyn.  Signed, Cassell Clement MD

## 2015-05-03 NOTE — Progress Notes (Signed)
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 - monitor intake and output. Net output > 7L on last check - cardiology assisting.  Active Problems:   Diabetes mellitus with diabetic nephropathy (HCC) - diabetic diet - Continue    Essential hypertension - Pt currently on amlodipine, carvedilol, hydralazine    CKD (chronic kidney disease) stage 4, GFR 15-29 ml/min (HCC) - Serum creatinine trending down on last check    Diabetic peripheral neuropathy (HCC) - stable    Hyperlipidemia - stable on statin, no chest pain reported    CVA (cerebral vascular accident) (HCC) - continue statin and blood pressure medications - stable no new focal neurological deficits reported.    Acute renal failure superimposed on stage 4 chronic kidney disease (HCC) - S creatinine stable. Pt does not want any HD in the future as such will not consult Nephrology  Code Status: full Family Communication: discussed with patient directly Disposition Plan: Pending improvement in condition.   Consultants:  None  Procedures:  none  Antibiotics:  none   HPI/Subjective: Pt has no new complaints. No acute issues reported overnight  Objective: Filed Vitals:   05/03/15 0403  BP: 178/58  Pulse: 64  Temp: 99 F (37.2 C)  Resp: 18    Intake/Output Summary (Last 24 hours) at 05/03/15 1419 Last data filed at 05/03/15 1400  Gross per 24 hour  Intake    906 ml  Output   2250 ml  Net  -1344 ml   Filed Weights   05/01/15 0550 05/02/15 0635 05/03/15 0403  Weight: 102.531 kg (226 lb 0.6 oz) 100.172 kg (220 lb 13.4 oz) 99.8 kg (220 lb 0.3 oz)    Exam:   General:  Pt in nad, alert and  awake  Cardiovascular: rrr, no rubs  Respiratory: no increased wob, no wheezes  Abdomen: soft, ND, NT  Musculoskeletal: no cyanosis or clubbing   Data Reviewed: Basic Metabolic Panel:  Recent Labs Lab 04/29/15 1110 04/29/15 1119 04/29/15 1620 04/30/15 0403 05/01/15 0500 05/02/15 0458 05/03/15 0359  NA 139 143  --  142 137 137 135  K 4.5 4.5  --  4.6 3.7 3.6 3.5  CL 108 113*  --  107 103 98* 97*  CO2 22  --   --  21* 25 25 25   GLUCOSE 76 74  --  111* 64* 130* 136*  BUN 74* 68*  --  75* 69* 76* 80*  CREATININE 4.23* 4.30* 4.10* 4.09* 4.20* 4.35* 4.57*  CALCIUM 9.2  --   --  9.1 8.9 9.0 8.6*   Liver Function Tests: No results for input(s): AST, ALT, ALKPHOS, BILITOT, PROT, ALBUMIN in the last 168 hours. No results for input(s): LIPASE, AMYLASE in the last 168 hours. No results for input(s): AMMONIA in the last 168 hours. CBC:  Recent Labs Lab 04/29/15 1110 04/29/15 1119 04/29/15 1620 04/30/15 0403  WBC 6.8  --  7.5 5.7  NEUTROABS 4.3  --   --   --   HGB 9.9* 11.2* 10.4* 9.2*  HCT 30.6* 33.0* 32.5* 28.5*  MCV 85.2  --  84.6 85.6  PLT 275  --  303 260   Cardiac Enzymes: No results for input(s): CKTOTAL, CKMB, CKMBINDEX, TROPONINI in the last 168 hours. BNP (  last 3 results)  Recent Labs  04/29/15 1100  BNP 311.2*    ProBNP (last 3 results) No results for input(s): PROBNP in the last 8760 hours.  CBG:  Recent Labs Lab 05/02/15 1143 05/02/15 1643 05/02/15 2047 05/03/15 0616 05/03/15 1149  GLUCAP 187* 177* 236* 139* 150*    No results found for this or any previous visit (from the past 240 hour(s)).   Studies: No results found.  Scheduled Meds: . allopurinol  150 mg Oral Daily  . amLODipine  10 mg Oral Daily  . aspirin EC  325 mg Oral Daily  . atorvastatin  10 mg Oral q1800  . calcitRIOL  0.25 mcg Oral Daily  . carvedilol  25 mg Oral BID  . cloNIDine  0.2 mg Oral BID  . furosemide  120 mg Intravenous 3 times per day  . heparin  5,000  Units Subcutaneous 3 times per day  . hydrALAZINE  50 mg Oral QID  . insulin aspart  0-20 Units Subcutaneous TID WC  . insulin glargine  20 Units Subcutaneous QHS  . metolazone  2.5 mg Oral Daily  . potassium chloride SA  20 mEq Oral Daily  . sodium chloride  3 mL Intravenous Q12H  . sodium chloride  3 mL Intravenous Q12H   Continuous Infusions:   Time spent: > 35 minutes  Penny Pia  [facility] Pager [phone]  If 7PM-7AM, please contact night-coverage at [email or web address], password [facility] 05/03/2015, 2:19 PM  LOS: 4 days

## 2015-05-04 LAB — BASIC METABOLIC PANEL
ANION GAP: 14 (ref 5–15)
BUN: 88 mg/dL — AB (ref 6–20)
CHLORIDE: 91 mmol/L — AB (ref 101–111)
CO2: 27 mmol/L (ref 22–32)
Calcium: 8.5 mg/dL — ABNORMAL LOW (ref 8.9–10.3)
Creatinine, Ser: 4.96 mg/dL — ABNORMAL HIGH (ref 0.61–1.24)
GFR, EST AFRICAN AMERICAN: 11 mL/min — AB (ref >60)
GFR, EST NON AFRICAN AMERICAN: 10 mL/min — AB (ref >60)
Glucose, Bld: 164 mg/dL — ABNORMAL HIGH (ref 65–99)
POTASSIUM: 3.5 mmol/L (ref 3.5–5.1)
SODIUM: 132 mmol/L — AB (ref 135–145)

## 2015-05-04 LAB — GLUCOSE, CAPILLARY
GLUCOSE-CAPILLARY: 166 mg/dL — AB (ref 65–99)
GLUCOSE-CAPILLARY: 230 mg/dL — AB (ref 65–99)
GLUCOSE-CAPILLARY: 152 mg/dL — AB (ref 65–99)
Glucose-Capillary: 158 mg/dL — ABNORMAL HIGH (ref 65–99)
Glucose-Capillary: 185 mg/dL — ABNORMAL HIGH (ref 65–99)

## 2015-05-04 MED ORDER — TORSEMIDE 20 MG PO TABS
40.0000 mg | ORAL_TABLET | Freq: Two times a day (BID) | ORAL | Status: DC
  Administered 2015-05-04 – 2015-05-05 (×3): 40 mg via ORAL
  Filled 2015-05-04 (×3): qty 2

## 2015-05-04 NOTE — Progress Notes (Signed)
[facility] PROGRESS NOTE  Dow Reibel Holohan [id number]:[id number] DOB: 10/01/1928 DOA: 04/29/2015 PCP: Myrlene Broker, MD  Assessment/Plan: Principal Problem:   Acute on chronic combined systolic (congestive) and diastolic (congestive) heart failure (HCC) - pt on carvedilol and lasix, metolazone, undergoing diuresis which cardiologist is managing. Will be switched to oral regimen if doing well plan is for d/c next am.  Active Problems:   Diabetes mellitus with diabetic nephropathy (HCC) - diabetic diet - Continue    Essential hypertension - Pt currently on amlodipine, carvedilol, hydralazine    CKD (chronic kidney disease) stage 4, GFR 15-29 ml/min (HCC) - Serum creatinine trending down on last check    Diabetic peripheral neuropathy (HCC) - stable    Hyperlipidemia - stable on statin, no chest pain reported    CVA (cerebral vascular accident) (HCC) - continue statin and blood pressure medications - stable no new focal neurological deficits reported.    Acute renal failure superimposed on stage 4 chronic kidney disease (HCC) - S creatinine stable. Pt does not want any HD in the future as such will not consult Nephrology  Code Status: full Family Communication: discussed with patient directly Disposition Plan: Pending improvement in condition.   Consultants:  None  Procedures:  none  Antibiotics:  none   HPI/Subjective: Pt has no new complaints. No acute issues reported overnight  Objective: Filed Vitals:   05/04/15 1141  BP: 122/50  Pulse: 58  Temp: 98.2 F (36.8 C)  Resp: 16    Intake/Output Summary (Last 24 hours) at 05/04/15 1402 Last data filed at 05/04/15 1315  Gross per 24 hour  Intake   1188 ml  Output   1850 ml  Net   -662 ml   Filed Weights   05/02/15 0635 05/03/15 0403 05/04/15 0425  Weight: 100.172 kg (220 lb 13.4 oz) 99.8 kg (220 lb 0.3 oz) 98.975 kg (218 lb 3.2 oz)    Exam:   General:  Pt in nad, alert and  awake  Cardiovascular: rrr, no rubs  Respiratory: no increased wob, no wheezes  Abdomen: soft, ND, NT  Musculoskeletal: no cyanosis or clubbing   Data Reviewed: Basic Metabolic Panel:  Recent Labs Lab 04/30/15 0403 05/01/15 0500 05/02/15 0458 05/03/15 0359 05/04/15 0339  NA 142 137 137 135 132*  K 4.6 3.7 3.6 3.5 3.5  CL 107 103 98* 97* 91*  CO2 21* 25 25 25 27   GLUCOSE 111* 64* 130* 136* 164*  BUN 75* 69* 76* 80* 88*  CREATININE 4.09* 4.20* 4.35* 4.57* 4.96*  CALCIUM 9.1 8.9 9.0 8.6* 8.5*   Liver Function Tests: No results for input(s): AST, ALT, ALKPHOS, BILITOT, PROT, ALBUMIN in the last 168 hours. No results for input(s): LIPASE, AMYLASE in the last 168 hours. No results for input(s): AMMONIA in the last 168 hours. CBC:  Recent Labs Lab 04/29/15 1110 04/29/15 1119 04/29/15 1620 04/30/15 0403  WBC 6.8  --  7.5 5.7  NEUTROABS 4.3  --   --   --   HGB 9.9* 11.2* 10.4* 9.2*  HCT 30.6* 33.0* 32.5* 28.5*  MCV 85.2  --  84.6 85.6  PLT 275  --  303 260   Cardiac Enzymes: No results for input(s): CKTOTAL, CKMB, CKMBINDEX, TROPONINI in the last 168 hours. BNP (last 3 results)  Recent Labs  04/29/15 1100  BNP 311.2*    ProBNP (last 3 results) No results for input(s): PROBNP in the last 8760 hours.  CBG:  Recent Labs Lab 05/03/15 1618 05/03/15  2102 05/04/15 0548 05/04/15 0603 05/04/15 1143  GLUCAP 212* 234* 158* 166* 185*    No results found for this or any previous visit (from the past 240 hour(s)).   Studies: No results found.  Scheduled Meds: . allopurinol  150 mg Oral Daily  . amLODipine  10 mg Oral Daily  . aspirin EC  325 mg Oral Daily  . atorvastatin  10 mg Oral q1800  . calcitRIOL  0.25 mcg Oral Daily  . carvedilol  25 mg Oral BID  . cloNIDine  0.2 mg Oral BID  . heparin  5,000 Units Subcutaneous 3 times per day  . hydrALAZINE  50 mg Oral QID  . insulin aspart  0-20 Units Subcutaneous TID WC  . insulin glargine  20 Units  Subcutaneous QHS  . metolazone  2.5 mg Oral Daily  . potassium chloride SA  20 mEq Oral Daily  . sodium chloride  3 mL Intravenous Q12H  . sodium chloride  3 mL Intravenous Q12H  . torsemide  40 mg Oral BID   Continuous Infusions:   Time spent: > 35 minutes  Penny Pia  [facility] Pager [phone]  If 7PM-7AM, please contact night-coverage at [email or web address], password [facility] 05/04/2015, 2:02 PM  LOS: 5 days

## 2015-05-04 NOTE — Progress Notes (Signed)
Patient Name: Casey Grimes Date of Encounter: 05/04/2015     Principal Problem:   Acute on chronic combined systolic (congestive) and diastolic (congestive) heart failure (HCC) Active Problems:   Diabetes mellitus with diabetic nephropathy (HCC)   Essential hypertension   CKD (chronic kidney disease) stage 4, GFR 15-29 ml/min (HCC)   Diabetic peripheral neuropathy (HCC)   Chronic diastolic heart failure (HCC)   Hyperlipidemia   CVA (cerebral vascular accident) (HCC)   Acute renal failure superimposed on stage 4 chronic kidney disease (HCC)    SUBJECTIVE  The patient states that he feels well.  He is anxious to call home.  Denies any chest pain or shortness of breath.  Rhythm is normal sinus rhythm.  Currently on IV Lasix 120 every 8 hours.  CURRENT MEDS . allopurinol  150 mg Oral Daily  . amLODipine  10 mg Oral Daily  . aspirin EC  325 mg Oral Daily  . atorvastatin  10 mg Oral q1800  . calcitRIOL  0.25 mcg Oral Daily  . carvedilol  25 mg Oral BID  . cloNIDine  0.2 mg Oral BID  . heparin  5,000 Units Subcutaneous 3 times per day  . hydrALAZINE  50 mg Oral QID  . insulin aspart  0-20 Units Subcutaneous TID WC  . insulin glargine  20 Units Subcutaneous QHS  . metolazone  2.5 mg Oral Daily  . potassium chloride SA  20 mEq Oral Daily  . sodium chloride  3 mL Intravenous Q12H  . sodium chloride  3 mL Intravenous Q12H  . torsemide  40 mg Oral BID    OBJECTIVE  Filed Vitals:   05/03/15 0403 05/03/15 1742 05/03/15 2039 05/04/15 0425  BP: 178/58 151/62 150/55 153/50  Pulse: 64  68 60  Temp: 99 F (37.2 C)  98 F (36.7 C) 98 F (36.7 C)  TempSrc: Oral  Oral Oral  Resp: 18  18 18   Height:      Weight: 220 lb 0.3 oz (99.8 kg)   218 lb 3.2 oz (98.975 kg)  SpO2: 98%  98% 98%    Intake/Output Summary (Last 24 hours) at 05/04/15 0948 Last data filed at 05/04/15 0859  Gross per 24 hour  Intake   1328 ml  Output   1850 ml  Net   -522 ml   Filed Weights   05/02/15  0635 05/03/15 0403 05/04/15 0425  Weight: 220 lb 13.4 oz (100.172 kg) 220 lb 0.3 oz (99.8 kg) 218 lb 3.2 oz (98.975 kg)    PHYSICAL EXAM  General: Pleasant, NAD. Neuro: Alert and oriented X 3. Moves all extremities spontaneously. Psych: Normal affect. HEENT:  Normal  Neck: Supple without bruits or JVD. Lungs:  Resp regular and unlabored, CTA. Heart: RRR no s3, s4, or murmurs. Abdomen: Soft, non-tender, non-distended, BS + x 4.  Extremities: Mild pretibial and pedal edema.  Accessory Clinical Findings  CBC No results for input(s): WBC, NEUTROABS, HGB, HCT, MCV, PLT in the last 72 hours. Basic Metabolic Panel  Recent Labs  05/03/15 0359 05/04/15 0339  NA 135 132*  K 3.5 3.5  CL 97* 91*  CO2 25 27  GLUCOSE 136* 164*  BUN 80* 88*  CREATININE 4.57* 4.96*  CALCIUM 8.6* 8.5*   Liver Function Tests No results for input(s): AST, ALT, ALKPHOS, BILITOT, PROT, ALBUMIN in the last 72 hours. No results for input(s): LIPASE, AMYLASE in the last 72 hours. Cardiac Enzymes No results for input(s): CKTOTAL, CKMB, CKMBINDEX, TROPONINI in  the last 72 hours. BNP Invalid input(s): POCBNP D-Dimer No results for input(s): DDIMER in the last 72 hours. Hemoglobin A1C No results for input(s): HGBA1C in the last 72 hours. Fasting Lipid Panel No results for input(s): CHOL, HDL, LDLCALC, TRIG, CHOLHDL, LDLDIRECT in the last 72 hours. Thyroid Function Tests No results for input(s): TSH, T4TOTAL, T3FREE, THYROIDAB in the last 72 hours.  Invalid input(s): FREET3  TELE  Normal sinus rhythm  ECG    Radiology/Studies  Dg Chest Port 1 View  04/29/2015  CLINICAL DATA:  Shortness of breath for 1 month EXAM: PORTABLE CHEST 1 VIEW COMPARISON:  Nov 22, 2014 FINDINGS: There is bilateral lower lobe interstitial edema. There is no airspace consolidation. Heart is upper normal in size with pulmonary vascularity within normal limits. No adenopathy. No bone lesions. IMPRESSION: Bilateral lower lobe  interstitial edema. Question a degree of congestive heart failure. No airspace consolidation. Heart upper normal in size. Electronically Signed   By: Bretta Bang III M.D.   On: 04/29/2015 12:49    ASSESSMENT AND PLAN 1. Acute on chronic diastolic heart failure with volume overload, right heart greater than left heart. Elevated BNP on admission 2. Chronic kidney disease, stage V. Function is stable despite the diuresis. Creatinine up slightly overnight. Not a candidate for hemodialysis. 3. Anemia, likely chronic kidney disease related.  Plan: Today we will stop his IV Lasix and switch him to torsemide 40 mg twice a day by mouth. Continue Zaroxolyn.  Possible discharge Monday if stable.  Recheck BMET Monday to check on potassium.   Signed, Cassell Clement MD

## 2015-05-05 ENCOUNTER — Encounter (HOSPITAL_COMMUNITY): Payer: Self-pay | Admitting: Student

## 2015-05-05 LAB — GLUCOSE, CAPILLARY
Glucose-Capillary: 128 mg/dL — ABNORMAL HIGH (ref 65–99)
Glucose-Capillary: 116 mg/dL — ABNORMAL HIGH (ref 65–99)

## 2015-05-05 LAB — BASIC METABOLIC PANEL
Anion gap: 15 (ref 5–15)
BUN: 95 mg/dL — AB (ref 6–20)
CALCIUM: 8.9 mg/dL (ref 8.9–10.3)
CO2: 28 mmol/L (ref 22–32)
CREATININE: 5.19 mg/dL — AB (ref 0.61–1.24)
Chloride: 90 mmol/L — ABNORMAL LOW (ref 101–111)
GFR calc Af Amer: 11 mL/min — ABNORMAL LOW (ref >60)
GFR, EST NON AFRICAN AMERICAN: 9 mL/min — AB (ref >60)
Glucose, Bld: 119 mg/dL — ABNORMAL HIGH (ref 65–99)
POTASSIUM: 3.6 mmol/L (ref 3.5–5.1)
SODIUM: 133 mmol/L — AB (ref 135–145)

## 2015-05-05 MED ORDER — POTASSIUM CHLORIDE CRYS ER 20 MEQ PO TBCR: 20.0000 meq | EXTENDED_RELEASE_TABLET | Freq: Every day | ORAL | Status: DC

## 2015-05-05 MED ORDER — INSULIN GLARGINE 100 UNIT/ML ~~LOC~~ SOLN: 20.0000 [IU] | Freq: Every day | SUBCUTANEOUS | Status: DC

## 2015-05-05 MED ORDER — METOLAZONE 2.5 MG PO TABS: 2.5000 mg | ORAL_TABLET | Freq: Every day | ORAL | Status: DC

## 2015-05-05 MED ORDER — TORSEMIDE 20 MG PO TABS: 40.0000 mg | ORAL_TABLET | Freq: Two times a day (BID) | ORAL | Status: DC

## 2015-05-05 MED ORDER — HUMALOG 100 UNIT/ML ~~LOC~~ SOLN: 5.0000 [IU] | Freq: Three times a day (TID) | SUBCUTANEOUS | Status: DC

## 2015-05-05 NOTE — Progress Notes (Signed)
PT Cancellation Note  Patient Details Name: Casey Grimes MRN: [id number] DOB: 06/19/1929   Cancelled Treatment:    Reason Eval/Treat Not Completed: Other (comment) (Pt states he is going home.  Has needed equipment at home)   Berline LopesWhite, Larena Ohnemus F 05/05/2015, 10:56 AM  Eber Jonesawn Bence Trapp,PT Acute Rehabilitation [phone] (pager)

## 2015-05-05 NOTE — Progress Notes (Signed)
[facility]; Alexis GoodellB Melton Walls RN,MHA,BSN [phone]

## 2015-05-05 NOTE — Discharge Summary (Signed)
Physician Discharge Summary  Casey Grimes ZOX:[id number] DOB: April 10, 1929 DOA: 04/29/2015  PCP: Myrlene Broker, MD  Admit date: 04/29/2015 Discharge date: 05/05/2015  Time spent: > 35 minutes  Recommendations for Outpatient Follow-up:  1. Monitor Serum creatinine  Discharge Diagnoses:  Principal Problem:   Acute on chronic combined systolic (congestive) and diastolic (congestive) heart failure (HCC) Active Problems:   Diabetes mellitus with diabetic nephropathy (HCC)   Essential hypertension   CKD (chronic kidney disease) stage 4, GFR 15-29 ml/min (HCC)   Diabetic peripheral neuropathy (HCC)   Chronic diastolic heart failure (HCC)   Hyperlipidemia   CVA (cerebral vascular accident) (HCC)   Acute renal failure superimposed on stage 4 chronic kidney disease (HCC)   Discharge Condition: stable  Diet recommendation: carb modified diet.  Filed Weights   05/03/15 0403 05/04/15 0425 05/05/15 0516  Weight: 99.8 kg (220 lb 0.3 oz) 98.975 kg (218 lb 3.2 oz) 96.707 kg (213 lb 3.2 oz)    History of present illness:  81 year old with history of acute on chronic combined systolic and diastolic congestive heart failure who presented with complaints of orthopnea, DOE, and progressive shortness of breath.  Hospital Course:  Acute on chronic combined systolic (congestive) and diastolic (congestive) heart failure [facility]) - Cardiology on board and assisted with management and recommended the following:  1. Acute on chronic diastolic CHF - he failed outpatient diuresis with doses of lasix increased from  BID to to  (4 tablets) in the AM and  ( two tablets) in the PM.  - Echo on 04/30/2015 showed EF of 60-65% and Grade 2 Diastolic Dysfunction. - Weight on admission was 235lbs. Now 213 lbs on 05/05/2015. Net output of -9.7L since admission. - IV Lasix stopped on 05/04/2015 and started on Torsemide  BID. Potassium stable at 3.6. - Continue Metolazone 2.5mg   daily.  Placed home health PT orders  Active Problems:  Diabetes mellitus with diabetic nephropathy (HCC) - diabetic diet - Continue   Essential hypertension - Pt currently on amlodipine, carvedilol, hydralazine   CKD (chronic kidney disease) stage 4, GFR 15-29 ml/min (HCC) - Serum creatinine to continue to be monitored by pcp   Diabetic peripheral neuropathy (HCC) - stable   Hyperlipidemia - stable on statin, no chest pain reported   CVA (cerebral vascular accident) (HCC) - continue statin and blood pressure medications - stable no new focal neurological deficits reported.   Acute renal failure superimposed on stage 4 chronic kidney disease (HCC) - S creatinine stable. Pt does not want any HD in the future as such Nephrology was not consulted   Procedures:  None  Consultations:  Cardiology  Discharge Exam: Filed Vitals:   05/05/15 0947  BP: 152/55  Pulse:   Temp: 98.4 F (36.9 C)  Resp:     General: Pt in nad, alert and awake Cardiovascular: s1 and s2 present, no rubs Respiratory: no increased wob, no wheezes  Discharge Instructions   Discharge Instructions    Call MD for:  difficulty breathing, headache or visual disturbances    Complete by:  As directed      Call MD for:  temperature >100.4    Complete by:  As directed      Diet - low sodium heart healthy    Complete by:  As directed      Discharge instructions    Complete by:  As directed   Please be sure to follow up with your primary care physician for further monitoring of your Serum creatinine.  Also follow up with cardiologist     Increase activity slowly    Complete by:  As directed           Current Discharge Medication List    START taking these medications   Details  metolazone (ZAROXOLYN) 2.5 MG tablet Take 1 tablet (2.5 mg total) by mouth daily. Qty: 30 tablet, Refills: 0    torsemide (DEMADEX) 20 MG tablet Take 2 tablets (40 mg total) by mouth 2 (two) times  daily. Qty: 60 tablet, Refills: 0      CONTINUE these medications which have CHANGED   Details  HUMALOG 100 UNIT/ML injection Inject 0.05 mLs (5 Units total) into the skin 3 (three) times daily with meals. Qty: 10 mL, Refills: 11    insulin glargine (LANTUS) 100 UNIT/ML injection Inject 0.2 mLs (20 Units total) into the skin at bedtime. Qty: 10 mL, Refills: 6    potassium chloride SA (KLOR-CON M20) 20 MEQ tablet Take 1 tablet (20 mEq total) by mouth daily. Qty: 20 tablet, Refills: 0      CONTINUE these medications which have NOT CHANGED   Details  allopurinol (ZYLOPRIM) 300 MG tablet Take 150 mg by mouth daily.      amLODipine (NORVASC) 10 MG tablet TAKE 1 TABLET BY MOUTH EVERY DAY Qty: 90 tablet, Refills: 3    aspirin EC 325 MG tablet Take 325 mg by mouth daily.    atorvastatin (LIPITOR) 10 MG tablet TAKE 1 TABLET (10 MG TOTAL) BY MOUTH DAILY. Qty: 90 tablet, Refills: 3    calcitRIOL (ROCALTROL) 0.25 MCG capsule Take 1 capsule (0.25 mcg total) by mouth daily. Qty: 90 capsule, Refills: 3    carvedilol (COREG) 25 MG tablet TAKE 1 TABLET BY MOUTH TWICE A DAY Qty: 60 tablet, Refills: 0    cloNIDine (CATAPRES) 0.2 MG tablet TAKE 1 TABLET (0.2 MG TOTAL) BY MOUTH 2 (TWO) TIMES DAILY. Qty: 60 tablet, Refills: 3    glucose blood test strip Use to check blood sugar 4 times daily. E11.21 Qty: 100 each, Refills: 12    hydrALAZINE (APRESOLINE) 50 MG tablet Take 1 tablet (50 mg total) by mouth 4 (four) times daily. Qty: 120 tablet, Refills: 5    Insulin Syringe-Needle U-100 (B-D INS SYRINGE 0.5CC/30GX1/2") 30G X 1/2" 0.5 ML MISC USE 3 PER DAY AS DIRECTED Qty: 270 each, Refills: 3      STOP taking these medications     furosemide (LASIX) 80 MG tablet        No Known Allergies Follow-up Information    Follow up with [facility].   Specialty:  Home Health Services   Why:  They will do your home health care at your home   Contact information:   [address] [place] [address] [phone]       Follow up with Tereso NewcomerScott Weaver, PA-C On 05/12/2015.   Specialties:  Physician Assistant, Radiology, Interventional Cardiology   Why:  Cardiology Hospital Follow-up on 05/12/2015 at 2:40PM   Contact information:   [address] [place] [address] [phone]        The results of significant diagnostics from this hospitalization (including imaging, microbiology, ancillary and laboratory) are listed below for reference.    Significant Diagnostic Studies: Dg Chest Port 1 View  04/29/2015  CLINICAL DATA:  Shortness of breath for 1 month EXAM: PORTABLE CHEST 1 VIEW COMPARISON:  Nov 22, 2014 FINDINGS: There is bilateral lower lobe  interstitial edema. There is no airspace consolidation. Heart is upper normal in size with pulmonary vascularity within normal limits. No adenopathy. No bone lesions. IMPRESSION: Bilateral lower lobe interstitial edema. Question a degree of congestive heart failure. No airspace consolidation. Heart upper normal in size. Electronically Signed   By: Bretta Bang III M.D.   On: 04/29/2015 12:49    Microbiology: No results found for this or any previous visit (from the past 240 hour(s)).   Labs: Basic Metabolic Panel:  Recent Labs Lab 05/01/15 0500 05/02/15 0458 05/03/15 0359 05/04/15 0339 05/05/15 0354  NA 137 137 135 132* 133*  K 3.7 3.6 3.5 3.5 3.6  CL 103 98* 97* 91* 90*  CO2 GLUCOSE 64* 130* 136* 164* 119*  BUN 69* 76* 80* 88* 95*  CREATININE 4.20* 4.35* 4.57* 4.96* 5.19*  CALCIUM 8.9 9.0 8.6* 8.5* 8.9   Liver Function Tests: No results for input(s): AST, ALT, ALKPHOS, BILITOT, PROT, ALBUMIN in the last 168 hours. No results for input(s): LIPASE, AMYLASE in the last 168 hours. No results for input(s): AMMONIA in the last 168 hours. CBC:  Recent Labs Lab 04/29/15 1110 04/29/15 1119 04/29/15 1620 04/30/15 0403  WBC 6.8  --  7.5 5.7  NEUTROABS  4.3  --   --   --   HGB 9.9* 11.2* 10.4* 9.2*  HCT 30.6* 33.0* 32.5* 28.5*  MCV 85.2  --  84.6 85.6  PLT 275  --  303 260   Cardiac Enzymes: No results for input(s): CKTOTAL, CKMB, CKMBINDEX, TROPONINI in the last 168 hours. BNP: BNP (last 3 results)  Recent Labs  04/29/15 1100  BNP 311.2*    ProBNP (last 3 results) No results for input(s): PROBNP in the last 8760 hours.  CBG:  Recent Labs Lab 05/04/15 0603 05/04/15 1143 05/04/15 1640 05/04/15 2117 05/05/15 0624  GLUCAP 166* 185* 230* 152* 128*       Signed:  Penny Pia  [facility] 05/05/2015, 11:09 AM

## 2015-05-05 NOTE — Care Management Important Message (Signed)
Important Message  Patient Details  Name: Casey Grimes MRN: [id number] Date of Birth: 03/19/1929   Medicare Important Message Given:  Yes-third notification given    Orson AloeMegan P Greyden Besecker 05/05/2015, 1:22 PM

## 2015-05-05 NOTE — Progress Notes (Signed)
Patient Name: Casey Grimes Date of Encounter: 05/05/2015  Principal Problem:   Acute on chronic combined systolic (congestive) and diastolic (congestive) heart failure (HCC) Active Problems:   Diabetes mellitus with diabetic nephropathy (HCC)   Essential hypertension   CKD (chronic kidney disease) stage 4, GFR 15-29 ml/min (HCC)   Diabetic peripheral neuropathy (HCC)   Chronic diastolic heart failure (HCC)   Hyperlipidemia   CVA (cerebral vascular accident) (HCC)   Acute renal failure superimposed on stage 4 chronic kidney disease [facility](HCC)    Primary Cardiologist: Dr. Patty SermonsBrackbill Patient Profile: [age] y.o. male w/ PMH of HTN, HLD, CKD stage IV-V, CVA, DM w/ neuropathy, and chronic diastolic CHF admitted on 04/29/2015 for  acute on chronic diastolic CHF w/ failed outpatient diuresis.  SUBJECTIVE: Denies any chest pain, palpitations, or shortness of breath. Repeatedly stating he wants to go home today.  OBJECTIVE Filed Vitals:   05/04/15 1019 05/04/15 1141 05/04/15 2037 05/05/15 0516  BP: 157/53 122/50 151/57 149/53  Pulse: 61 58 59 59  Temp: 98 F (36.7 C) 98.2 F (36.8 C) 98.1 F (36.7 C) 98.3 F (36.8 C)  TempSrc: Oral Oral Oral Oral  Resp: 16 16 17 18   Height:      Weight:    213 lb 3.2 oz (96.707 kg)  SpO2: 96% 96% 100% 99%    Intake/Output Summary (Last 24 hours) at 05/05/15 0825 Last data filed at 05/05/15 0600  Gross per 24 hour  Intake   1160 ml  Output   2750 ml  Net  -1590 ml   Filed Weights   05/03/15 0403 05/04/15 0425 05/05/15 0516  Weight: 220 lb 0.3 oz (99.8 kg) 218 lb 3.2 oz (98.975 kg) 213 lb 3.2 oz (96.707 kg)    PHYSICAL EXAM General: Well developed, well nourished, male in no acute distress. Head: Normocephalic, atraumatic.  Neck: Supple without bruits, JVD not elevated. Lungs:  Resp regular and unlabored, CTA without wheezing or rales Heart: RRR, S1, S2, no S3, S4, or murmur; no rub. Abdomen: Soft, non-tender, non-distended with  normoactive bowel sounds. No hepatomegaly. No rebound/guarding. No obvious abdominal masses. Extremities: No clubbing, cyanosis, or edema. Distal pedal pulses are 2+ bilaterally. Neuro: Alert and oriented X 3. Moves all extremities spontaneously. Psych: Normal affect.   LABS: Basic Metabolic Panel: Recent Labs  05/04/15 0339 05/05/15 0354  NA 132* 133*  K 3.5 3.6  CL 91* 90*  CO2 27 28  GLUCOSE 164* 119*  BUN 88* 95*  CREATININE 4.96* 5.19*  CALCIUM 8.5* 8.9   BNP:  B NATRIURETIC PEPTIDE  Date/Time Value Ref Range Status  04/29/2015 11:00 AM 311.2* 0.0 - 100.0 pg/mL Final   PRO B NATRIURETIC PEPTIDE (BNP)  Date/Time Value Ref Range Status  07/24/2013 07:05 AM 1745.0* 0 - 450 pg/mL Final  07/23/2013 02:55 AM 914.1* 0 - 450 pg/mL Final    TELE:   Sinus bradycardia with rate in mid-50's. No atopic events.     ECHO: 04/30/2015 Study Conclusions - Left ventricle: The cavity size was normal. There was moderate concentric hypertrophy. Systolic function was normal. The estimated ejection fraction was in the range of 60% to 65%. Wall motion was normal; there were no regional wall motion abnormalities. Features are consistent with a pseudonormal left ventricular filling pattern, with concomitant abnormal relaxation and increased filling pressure (grade 2 diastolic dysfunction). Doppler parameters are consistent with high ventricular filling pressure. - Mitral valve: Calcified annulus. There was mild regurgitation.  Current Medications:  .  allopurinol  150 mg Oral Daily  . amLODipine  10 mg Oral Daily  . aspirin EC  325 mg Oral Daily  . atorvastatin  10 mg Oral q1800  . calcitRIOL  0.25 mcg Oral Daily  . carvedilol  25 mg Oral BID  . cloNIDine  0.2 mg Oral BID  . heparin  5,000 Units Subcutaneous 3 times per day  . hydrALAZINE  50 mg Oral QID  . insulin aspart  0-20 Units Subcutaneous TID WC  . insulin glargine  20 Units Subcutaneous QHS  . metolazone   2.5 mg Oral Daily  . potassium chloride SA  20 mEq Oral Daily  . sodium chloride  3 mL Intravenous Q12H  . sodium chloride  3 mL Intravenous Q12H  . torsemide  40 mg Oral BID      ASSESSMENT AND PLAN:  1. Acute on chronic diastolic CHF - he failed outpatient diuresis with doses of lasix increased from  BID to to  (4 tablets) in the AM and  ( two tablets) in the PM.  - Echo on 04/30/2015 showed EF of 60-65% and Grade 2 Diastolic Dysfunction. - Weight on admission was 235lbs. Now 213 lbs on 05/05/2015. Net output of -9.7L since admission. - IV Lasix stopped on 05/04/2015 and started on Torsemide  BID. Potassium stable at 3.6. - Continue Metolazone 2.5mg  daily.  2. Acute on chronic kidney disease - now stage V. Creatinine 4.23 on admission. Elevated to 5.19 on 05/05/2015. - Per PCP notes, he is not a good HD candidate and does not want to go this route.   3. HTN - Has been 122/50 - 157/57 in the past 24 hours. - Continue amlodipine  daily, coreg  BID, clonidine 0.2mg  BID, hydralazine  QID.  4. Previous CVA - continue ASA  5. HLD - continue statin therapy - otherwise, per admitting team  Has previously scheduled office visit with Dr. Patty Sermons on 05/22/2015. Will schedule closer follow-up if needed as determined by MD.  Signed, Ellsworth Lennox , PA-C 8:25 AM 05/05/2015 Pager: [phone] Patient has remained stable overnight. Tolerating torsemide. Okay for discharge today from cardiac standpoint. He will need a transition of care followup appointment in our office with me or PA/NP within a week.  Check BMET then.

## 2015-05-07 ENCOUNTER — Telehealth: Payer: Self-pay | Admitting: *Deleted

## 2015-05-07 ENCOUNTER — Telehealth: Payer: Self-pay | Admitting: Internal Medicine

## 2015-05-07 NOTE — Telephone Encounter (Signed)
Pt was on TCM list he has made hosp f/u with Cardiologist Dr. Alben SpittleWeaver on 05/12/15. He did not make with PCP, also [facility] is [facility] services with pt...Casey Grimes/lmb

## 2015-05-07 NOTE — Telephone Encounter (Signed)
Fleet ContrasRachel called from [facility] request order for skill nursing for Mr. Casey Grimes. Pt was in the hospital. Please give them call back

## 2015-05-07 NOTE — Telephone Encounter (Signed)
Called and spoke with Fleet Contrasachel and gave verbal authorization for home health.

## 2015-05-09 ENCOUNTER — Telehealth: Payer: Self-pay | Admitting: Internal Medicine

## 2015-05-09 ENCOUNTER — Telehealth (HOSPITAL_COMMUNITY): Payer: Self-pay | Admitting: Surgery

## 2015-05-09 NOTE — Telephone Encounter (Signed)
Heart Failure Nurse Navigator Post Discharge Telephone Call  I called to check on Mr. Clelia SchaumannMcGirt after his recent hospitalization.  I spoke with his wife and she says that he has been doing "well" since discharge.  His weight today was 207 lbs versus 213.2 lbs on 10/17 discharge date.  She says that she has been helping him with watching his salt and sodium.  I reviewed briefly with her a low sodium diet and high sodium foods to avoid.  I encouraged her to read labels and keep sodium intake to under 2000 mg per day if possible.  She says that he has not had any problems getting or taking prescribed medications.  She did have questions regarding his insulin--as the dosage was changed after discharge from hospital and his sugar has been running in the 200's.  I have referred her to call PCP regarding his insulin.  She tells me that he has follow-up scheduled with [facility] on 10/24.  I encouraged her to call me with any concerns or questions related to his HF.
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
## 2015-05-12 ENCOUNTER — Encounter: Payer: Self-pay | Admitting: Physician Assistant

## 2015-05-12 ENCOUNTER — Ambulatory Visit (INDEPENDENT_AMBULATORY_CARE_PROVIDER_SITE_OTHER): Payer: Medicare Other | Admitting: Physician Assistant

## 2015-05-12 VITALS — BP 140/50 | HR 62 | Ht 71.0 in | Wt 207.8 lb

## 2015-05-12 DIAGNOSIS — I5032 Chronic diastolic (congestive) heart failure: Secondary | ICD-10-CM | POA: Diagnosis not present

## 2015-05-12 DIAGNOSIS — E785 Hyperlipidemia, unspecified: Secondary | ICD-10-CM

## 2015-05-12 DIAGNOSIS — I10 Essential (primary) hypertension: Secondary | ICD-10-CM

## 2015-05-12 DIAGNOSIS — N185 Chronic kidney disease, stage 5: Secondary | ICD-10-CM | POA: Diagnosis not present

## 2015-05-12 DIAGNOSIS — E1142 Type 2 diabetes mellitus with diabetic polyneuropathy: Secondary | ICD-10-CM

## 2015-05-12 LAB — BASIC METABOLIC PANEL
BUN: 133 mg/dL — AB (ref 7–25)
CALCIUM: 10.4 mg/dL — AB (ref 8.6–10.3)
CHLORIDE: 92 mmol/L — AB (ref 98–110)
CO2: 22 mmol/L (ref 20–31)
CREATININE: 5.83 mg/dL — AB (ref 0.70–1.11)
Glucose, Bld: 261 mg/dL — ABNORMAL HIGH (ref 65–99)
Potassium: 3.6 mmol/L (ref 3.5–5.3)
Sodium: 134 mmol/L — ABNORMAL LOW (ref 135–146)

## 2015-05-12 NOTE — Patient Instructions (Signed)
Medication Instructions:  No changes today.  Labwork: BMET today  Testing/Procedures: None   Follow-Up: Dr. Cassell Clementhomas Brackbill or Tereso NewcomerScott Terryann Verbeek, PA-C in 1 month.   Any Other Special Instructions Will Be Listed Below (If Applicable).

## 2015-05-16 ENCOUNTER — Ambulatory Visit (INDEPENDENT_AMBULATORY_CARE_PROVIDER_SITE_OTHER): Payer: Medicare Other | Admitting: Internal Medicine

## 2015-05-16 ENCOUNTER — Encounter: Payer: Self-pay | Admitting: Internal Medicine

## 2015-05-16 VITALS — BP 162/68 | HR 67 | Temp 98.3°F | Resp 12 | Ht 71.0 in | Wt 207.0 lb

## 2015-05-16 DIAGNOSIS — I5032 Chronic diastolic (congestive) heart failure: Secondary | ICD-10-CM

## 2015-05-16 DIAGNOSIS — E0821 Diabetes mellitus due to underlying condition with diabetic nephropathy: Secondary | ICD-10-CM

## 2015-05-16 DIAGNOSIS — N185 Chronic kidney disease, stage 5: Secondary | ICD-10-CM | POA: Diagnosis not present

## 2015-05-16 DIAGNOSIS — Z23 Encounter for immunization: Secondary | ICD-10-CM | POA: Diagnosis not present

## 2015-05-16 DIAGNOSIS — Z794 Long term (current) use of insulin: Secondary | ICD-10-CM

## 2015-05-16 MED ORDER — TORSEMIDE 20 MG PO TABS: 40.0000 mg | ORAL_TABLET | Freq: Two times a day (BID) | ORAL | Status: DC

## 2015-05-16 NOTE — Assessment & Plan Note (Signed)
Appears to have worsened during recent hospital stay and may be progression rather than acute worsening. Getting labs in 2 weeks with nephrology Dr Lowell GuitarPowell. Does not wish to pursue dialysis and talked to them about hospice as an option if his kidney function is persistently worsened.

## 2015-05-16 NOTE — Progress Notes (Signed)
   Subjective:    Patient ID: Casey Grimes, male    DOB: 08/31/1928, 81 y.o.   MRN: [id number]  HPI The patient is an 81 YO man coming in for hospital follow up. He was in the hospital for volume overload with worsening renal failure. They did diureses off 20 pounds of fluid. His weights have been stable since leaving home. He followed up with the cardiologist several days ago and the kidney function was slightly worse. They had some changes to his insulin in the hospital and he is now doing 20 units of lantus and 5 units of mealtime. Overall his sugars are high and about 200-250 most of the day. No low sugars. Appetite is okay but not great. Energy is very poor. No falls and still using walker for ambulation. His wife is with him and gives most of the history.   Review of Systems  Unable to perform ROS: Dementia  Constitutional: Negative for fever, chills, activity change, appetite change and fatigue.  Respiratory: Negative for cough, chest tightness, shortness of breath and wheezing.   Cardiovascular: Positive for leg swelling. Negative for chest pain and palpitations.       Less than usual  Gastrointestinal: Negative for abdominal pain, diarrhea, constipation and abdominal distention.  Musculoskeletal: Positive for gait problem.       Slow with walker  Skin: Negative for wound.  Neurological: Negative for dizziness, syncope, weakness, light-headedness and headaches.  Psychiatric/Behavioral: Negative.       Objective:   Physical Exam  Constitutional: He appears well-developed and well-nourished.  Speech slow but content intact  HENT:  Head: Normocephalic and atraumatic.  Eyes: EOM are normal.  Neck: Normal range of motion. Neck supple. No JVD present.  Cardiovascular: Normal rate and regular rhythm.   No murmur heard. Pulmonary/Chest: Effort normal. No respiratory distress. He has no wheezes. He has no rales. He exhibits no tenderness.  Abdominal: Soft. Bowel sounds are normal. He  exhibits no distension. There is no tenderness. There is no rebound.  Musculoskeletal: Normal range of motion. He exhibits no edema.  Neurological: He is alert.  Patient walked slow with walker and slow with standing.   Skin: Skin is warm and dry.  Nursing note and vitals reviewed.  Filed Vitals:   05/16/15 1558  BP: 162/68  Pulse: 67  Temp: 98.3 F (36.8 C)  TempSrc: Oral  Resp: 12  Height: 5\' 11"  (1.803 m)  Weight: 207 lb (93.895 kg)  SpO2: 99%      Assessment & Plan:  Prevnar 13 given at visit.

## 2015-05-16 NOTE — Assessment & Plan Note (Signed)
Blood sugars overall too high but mealtimes appear consistent and mealtime coverage with humalog appears appropriate. Will increase the lantus to 22 units at night time and can adjust as needed. Would rather avoid hypoglycemia than over treat at this time.

## 2015-05-16 NOTE — Assessment & Plan Note (Signed)
At this time stable, no edema or rales on exam. Taking metolazone and torsemide for diuresis and making urine still.

## 2015-05-16 NOTE — Patient Instructions (Signed)
We will have you increase the lantus to 22 units at night time. Keep the mealtime the same at 5 units before meals.

## 2015-05-16 NOTE — Progress Notes (Signed)
Pre visit review using our clinic review tool, if applicable. No additional management support is needed unless otherwise documented below in the visit note. 

## 2015-05-21 ENCOUNTER — Telehealth: Payer: Self-pay | Admitting: Internal Medicine

## 2015-05-21 NOTE — Telephone Encounter (Signed)
Pt's wife called and he has been throwing up and their aid had told him he probably caught a stomach bug. She is requesting something to be called in for nausea. Also, He hasn't had a bowel movement in a few days and is also requesting something be sent in for constipation.   Pharmacy is [facility] on [address]

## 2015-05-22 ENCOUNTER — Ambulatory Visit: Payer: Medicare Other | Admitting: Cardiology

## 2015-05-22 MED ORDER — ONDANSETRON HCL 4 MG PO TABS: 4.0000 mg | ORAL_TABLET | Freq: Three times a day (TID) | ORAL | Status: DC | PRN

## 2015-05-22 NOTE — Telephone Encounter (Signed)
Spoke with patient's wife and she will go pick up medication from the pharmacy.

## 2015-05-22 NOTE — Telephone Encounter (Signed)
The nausea could be coming from his kidney problems. Will call in zofran for the nausea. He can take miralax for the constipation. If he continues to vomit and cannot hold down fluids recommend visit.

## 2015-05-22 NOTE — Telephone Encounter (Signed)
Does he need an office visit?  

## 2015-05-22 NOTE — Telephone Encounter (Signed)
Patient called back regarding the note below. Please call patient

## 2015-05-30 ENCOUNTER — Other Ambulatory Visit: Payer: Self-pay

## 2015-05-30 ENCOUNTER — Other Ambulatory Visit: Payer: Self-pay | Admitting: Internal Medicine

## 2015-05-30 MED ORDER — ONDANSETRON HCL 4 MG PO TABS: 4.0000 mg | ORAL_TABLET | Freq: Three times a day (TID) | ORAL | Status: DC | PRN

## 2015-05-31 ENCOUNTER — Other Ambulatory Visit: Payer: Self-pay | Admitting: Internal Medicine

## 2015-06-01 ENCOUNTER — Other Ambulatory Visit: Payer: Self-pay | Admitting: Internal Medicine

## 2015-06-17 ENCOUNTER — Other Ambulatory Visit: Payer: Self-pay | Admitting: Internal Medicine

## 2015-06-23 ENCOUNTER — Ambulatory Visit: Payer: Medicare Other | Admitting: Cardiology

## 2015-07-08 ENCOUNTER — Other Ambulatory Visit: Payer: Self-pay | Admitting: Internal Medicine

## 2015-07-17 ENCOUNTER — Ambulatory Visit: Payer: Medicare Other | Admitting: Internal Medicine

## 2015-07-25 ENCOUNTER — Other Ambulatory Visit: Payer: Self-pay | Admitting: Internal Medicine

## 2015-07-25 ENCOUNTER — Ambulatory Visit (INDEPENDENT_AMBULATORY_CARE_PROVIDER_SITE_OTHER): Payer: Medicare Other | Admitting: Internal Medicine

## 2015-07-25 ENCOUNTER — Encounter: Payer: Self-pay | Admitting: Internal Medicine

## 2015-07-25 VITALS — BP 136/82 | HR 65 | Temp 98.2°F | Resp 20 | Ht 71.0 in | Wt 221.5 lb

## 2015-07-25 DIAGNOSIS — E0821 Diabetes mellitus due to underlying condition with diabetic nephropathy: Secondary | ICD-10-CM | POA: Diagnosis not present

## 2015-07-25 DIAGNOSIS — I5032 Chronic diastolic (congestive) heart failure: Secondary | ICD-10-CM | POA: Diagnosis not present

## 2015-07-25 DIAGNOSIS — I10 Essential (primary) hypertension: Secondary | ICD-10-CM

## 2015-07-25 DIAGNOSIS — N185 Chronic kidney disease, stage 5: Secondary | ICD-10-CM | POA: Diagnosis not present

## 2015-07-25 DIAGNOSIS — Z794 Long term (current) use of insulin: Secondary | ICD-10-CM

## 2015-07-25 MED ORDER — ONETOUCH ULTRA SYSTEM W/DEVICE KIT: 1.0000 | PACK | Freq: Once | Status: DC

## 2015-07-25 NOTE — Assessment & Plan Note (Signed)
BP at goal on clonidine, amlodipine, hydralazine, torsemide. Continue and he will still follow with nephrology.

## 2015-07-25 NOTE — Progress Notes (Signed)
   Subjective:    Patient ID: Casey Grimes, male    DOB: 01/10/1929, 82 y.o.   MRN: [id number]  HPI The patient is an 82 YO man coming in for follow up of his chronic medical conditions including his diabetes (takes lantus and humalog, complicated by stage 5 CKD, some hypoglycemia to 61 lately, no symptoms), his blood pressure (BP stable on clonidine, amlodipine, hydralazine, coreg, torsemide, complicated by stage 5 CKD) and his diastolic heart failure (no recent hospitalizations, on torsemide, weight stable, diet is mediocre). No new concerns.   Review of Systems  Unable to perform ROS: Dementia  Constitutional: Negative for fever, chills, activity change, appetite change and fatigue.  Respiratory: Negative for cough, chest tightness, shortness of breath and wheezing.   Cardiovascular: Positive for leg swelling. Negative for chest pain and palpitations.       Less than usual  Gastrointestinal: Negative for abdominal pain, diarrhea, constipation and abdominal distention.  Musculoskeletal: Positive for gait problem.       Slow with walker  Skin: Negative for wound.  Neurological: Negative for dizziness, syncope, weakness, light-headedness and headaches.  Psychiatric/Behavioral: Negative.       Objective:   Physical Exam  Constitutional: He appears well-developed and well-nourished.  Speech slow but content intact  HENT:  Head: Normocephalic and atraumatic.  Eyes: EOM are normal.  Neck: Normal range of motion. Neck supple. No JVD present.  Cardiovascular: Normal rate and regular rhythm.   No murmur heard. Pulmonary/Chest: Effort normal. No respiratory distress. He has no wheezes. He has no rales. He exhibits no tenderness.  Abdominal: Soft. Bowel sounds are normal. He exhibits no distension. There is no tenderness. There is no rebound.  Musculoskeletal:  1+ pitting edema bilateral, consistent with prior  Neurological: He is alert.  Patient walked well with walker but slow with  standing.   Skin: Skin is warm and dry.  Nursing note and vitals reviewed.  Filed Vitals:   07/25/15 0949  BP: 136/82  Pulse: 65  Temp: 98.2 F (36.8 C)  TempSrc: Oral  Resp: 20  Height: 5\' 11"  (1.803 m)  Weight: 221 lb 8 oz (100.472 kg)  SpO2: 97%      Assessment & Plan:

## 2015-07-25 NOTE — Patient Instructions (Signed)
We do not need any blood work today and do not need any shots.   We will decrease the lantus to 21 units at night time.   Keep up the good work with your health.

## 2015-07-25 NOTE — Progress Notes (Signed)
Pre visit review using our clinic review tool, if applicable. No additional management support is needed unless otherwise documented below in the visit note. 

## 2015-07-25 NOTE — Assessment & Plan Note (Signed)
No flare today, uses torsemide for fluid. On ASA daily.

## 2015-07-25 NOTE — Assessment & Plan Note (Signed)
Not checking HgA1c due to poor kidney function making it not a good screening test. Sugars at goal on log today. Some low sugars of 61 in the morning since increase in lantus last visit. Will decrease lantus to 21 units at night and keep humalog 5 units with meals.

## 2015-07-25 NOTE — Assessment & Plan Note (Signed)
Last GFR 11 from nephrology. Does not want dialysis and stable kidney function. BP at goal and sugars doing well.

## 2015-07-27 ENCOUNTER — Other Ambulatory Visit: Payer: Self-pay | Admitting: Internal Medicine

## 2015-08-14 ENCOUNTER — Ambulatory Visit (INDEPENDENT_AMBULATORY_CARE_PROVIDER_SITE_OTHER): Payer: Medicare Other | Admitting: Cardiology

## 2015-08-14 ENCOUNTER — Encounter: Payer: Self-pay | Admitting: Cardiology

## 2015-08-14 VITALS — BP 170/62 | HR 60 | Ht 70.5 in | Wt 231.0 lb

## 2015-08-14 DIAGNOSIS — Z79899 Other long term (current) drug therapy: Secondary | ICD-10-CM | POA: Diagnosis not present

## 2015-08-14 DIAGNOSIS — R0602 Shortness of breath: Secondary | ICD-10-CM

## 2015-08-14 DIAGNOSIS — I5032 Chronic diastolic (congestive) heart failure: Secondary | ICD-10-CM

## 2015-08-14 DIAGNOSIS — N185 Chronic kidney disease, stage 5: Secondary | ICD-10-CM

## 2015-08-14 LAB — BASIC METABOLIC PANEL
BUN: 50 mg/dL — AB (ref 7–25)
CHLORIDE: 106 mmol/L (ref 98–110)
CO2: 20 mmol/L (ref 20–31)
CREATININE: 3.61 mg/dL — AB (ref 0.70–1.11)
Calcium: 9 mg/dL (ref 8.6–10.3)
GLUCOSE: 78 mg/dL (ref 65–99)
POTASSIUM: 4.1 mmol/L (ref 3.5–5.3)
Sodium: 138 mmol/L (ref 135–146)

## 2015-08-14 MED ORDER — METOLAZONE 2.5 MG PO TABS: ORAL_TABLET | ORAL | Status: DC

## 2015-08-14 NOTE — Patient Instructions (Addendum)
Medication Instructions:  Your physician has recommended you make the following change in your medication:  1) START Metolazone 2.5 mg on Monday, Wednesday, Friday & Saturday.  Labwork: Today: BMET  Your physician recommends that you return for lab work in: 2 months for BMET (when you come to see Dr Elease Hashimoto)  Testing/Procedures: None ordered  Follow-Up: You have been referred to establish cardiology care with Dr. Elease Hashimoto in 2 months.  If you need a refill on your cardiac medications before your next appointment, please call your pharmacy.  Thank you for choosing [facility]!!       Metolazone tablets What is this medicine? METOLAZONE (me TOLE a zone) is a diuretic. It increases the amount of urine passed, which causes the body to lose salt and water. This medicine is used to treat high blood pressure. It is also reduces the swelling and water retention caused by heart or kidney disease. This medicine may be used for other purposes; ask your health care provider or pharmacist if you have questions. What should I tell my health care provider before I take this medicine? They need to know if you have any of these conditions: -diabetes -gout -immune system problems, like lupus -kidney disease -liver disease -pancreatitis -small amount of urine or difficulty passing urine -an unusual or allergic reaction to metolazone, sulfa drugs, other medicines, foods, dyes, or preservatives -pregnant or trying to get pregnant -breast-feeding How should I use this medicine? Take this medicine by mouth with a glass of water. Follow the directions on the prescription label. Remember that you will need to pass urine frequently after taking this medicine. Do not take your doses at a time of day that will cause you problems. Do not take at bedtime. Take your medicine at regular intervals. Do not take your medicine more often than directed. Do not stop taking except on your doctor's advice. Talk to  your pediatrician regarding the use of this medicine in children. Special care may be needed. Overdosage: If you think you have taken too much of this medicine contact a poison control center or emergency room at once. NOTE: This medicine is only for you. Do not share this medicine with others. What if I miss a dose? If you miss a dose, take it as soon as you can. If it is almost time for your next dose, take only that dose. Do not take double or extra doses. What may interact with this medicine? -alcohol -antiinflammatory drugs for pain or swelling -barbiturates for sleep or seizure control -digoxin -dofetilide -lithium -medicines for blood sugar -medicines for high blood pressure -medicines that relax muscles for surgery -methenamine -other diuretics -some medicines for pain -steroid hormones like cortisone, hydrocortisone, and prednisone -warfarin This list may not describe all possible interactions. Give your health care provider a list of all the medicines, herbs, non-prescription drugs, or dietary supplements you use. Also tell them if you smoke, drink alcohol, or use illegal drugs. Some items may interact with your medicine. What should I watch for while using this medicine? Visit your doctor or health care professional for regular checks on your progress. Check your blood pressure as directed. Ask your doctor or health care professional what your blood pressure should be and when you should contact him or her. You may need to be on a special diet while taking this medicine. Ask your doctor. Check with your doctor or health care professional if you get an attack of severe diarrhea, nausea and vomiting, or if you  sweat a lot. The loss of too much body fluid can make it dangerous for you to take this medicine. You may get drowsy or dizzy. Do not drive, use machinery, or do anything that needs mental alertness until you know how this medicine affects you. Do not stand or sit up quickly,  especially if you are an older patient. This reduces the risk of dizzy or fainting spells. Alcohol may interfere with the effect of this medicine. Avoid alcoholic drinks. This medicine may affect your blood sugar level. If you have diabetes, check with your doctor or health care professional before changing the dose of your diabetic medicine. This medicine can make you more sensitive to the sun. Keep out of the sun. If you cannot avoid being in the sun, wear protective clothing and use sunscreen. Do not use sun lamps or tanning beds/booths. What side effects may I notice from receiving this medicine? Side effects that you should report to your doctor or health care professional as soon as possible: -allergic reactions such as skin rash or itching, hives, swelling of the lips, mouth, tongue, or throat -fast or irregular heartbeat, chest pain -feeling faint -fever, chills -gout pain -hot red lump on leg -muscle pain, cramps -nausea, vomiting -numbness or tingling in hands, feet -pain or difficulty when passing urine -redness, blistering, peeling or loosening of the skin, including inside the mouth -unusual bleeding or bruising -unusually weak or tired -yellowing of the eyes, skin Side effects that usually do not require medical attention (report to your doctor or health care professional if they continue or are bothersome): -abdominal pain -blurred vision -constipation or diarrhea -dry mouth -headache This list may not describe all possible side effects. Call your doctor for medical advice about side effects. You may report side effects to FDA at 1-800-FDA-1088. Where should I keep my medicine? Keep out of the reach of children. Store at room temperature between 15 and 30 degrees C (59 and 86 degrees F). Protect from light. Keep container tightly closed. Throw away any unused medicine after the expiration date. NOTE: This sheet is a summary. It may not cover all possible information. If  you have questions about this medicine, talk to your doctor, pharmacist, or health care provider.    2016, [employer]vier/[employer] Standard. (2008-01-22 14:11:48)
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
## 2015-08-25 ENCOUNTER — Other Ambulatory Visit: Payer: Self-pay | Admitting: Internal Medicine

## 2015-09-05 ENCOUNTER — Other Ambulatory Visit: Payer: Self-pay | Admitting: Internal Medicine

## 2015-09-05 NOTE — Telephone Encounter (Signed)
Pt called to check on this, he is out of this med. Pharmacy stated they have been trying to get this refill for pt but haven't got an answer from our office.

## 2015-09-08 ENCOUNTER — Other Ambulatory Visit: Payer: Self-pay | Admitting: Internal Medicine

## 2015-09-08 NOTE — Telephone Encounter (Signed)
Receive call wife states pt is outo f his hydralazine needing refill. Inform wife pharmacy has sent request will approve and send back to [facility].../lmb

## 2015-09-09 ENCOUNTER — Other Ambulatory Visit: Payer: Self-pay | Admitting: Internal Medicine

## 2015-09-22 ENCOUNTER — Other Ambulatory Visit: Payer: Self-pay | Admitting: Internal Medicine

## 2015-10-06 ENCOUNTER — Other Ambulatory Visit: Payer: Self-pay | Admitting: Internal Medicine

## 2015-10-16 ENCOUNTER — Encounter: Payer: Medicare Other | Admitting: Cardiovascular Disease

## 2015-10-16 ENCOUNTER — Encounter: Payer: Self-pay | Admitting: Cardiovascular Disease

## 2015-10-16 ENCOUNTER — Other Ambulatory Visit (INDEPENDENT_AMBULATORY_CARE_PROVIDER_SITE_OTHER): Payer: Medicare Other

## 2015-10-16 ENCOUNTER — Ambulatory Visit (INDEPENDENT_AMBULATORY_CARE_PROVIDER_SITE_OTHER): Payer: Medicare Other | Admitting: Cardiovascular Disease

## 2015-10-16 VITALS — BP 144/50 | HR 60 | Ht 71.0 in | Wt 205.6 lb

## 2015-10-16 DIAGNOSIS — I10 Essential (primary) hypertension: Secondary | ICD-10-CM

## 2015-10-16 DIAGNOSIS — I5032 Chronic diastolic (congestive) heart failure: Secondary | ICD-10-CM | POA: Diagnosis not present

## 2015-10-16 LAB — BASIC METABOLIC PANEL
BUN: 89 mg/dL — AB (ref 7–25)
CALCIUM: 9.7 mg/dL (ref 8.6–10.3)
CHLORIDE: 98 mmol/L (ref 98–110)
CO2: 25 mmol/L (ref 20–31)
CREATININE: 4.29 mg/dL — AB (ref 0.70–1.11)
Glucose, Bld: 95 mg/dL (ref 65–99)
Potassium: 3.6 mmol/L (ref 3.5–5.3)
Sodium: 136 mmol/L (ref 135–146)
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
## 2015-10-16 NOTE — Patient Instructions (Signed)
Medication Instructions:  Your physician recommends that you continue on your current medications as directed. Please refer to the Current Medication list given to you today.   Labwork: None Ordered   Testing/Procedures: None Ordered   Follow-Up: Your physician wants you to follow-up in: 6 months with Dr. Nahser.  You will receive a reminder letter in the mail two months in advance. If you don't receive a letter, please call our office to schedule the follow-up appointment.   If you need a refill on your cardiac medications before your next appointment, please call your pharmacy.   Thank you for choosing [facility]! Pao Haffey, RN [phone]    

## 2015-10-16 NOTE — Progress Notes (Signed)
This encounter was created in error - please disregard. This encounter was created in error - please disregard. This encounter was created in error - please disregard. 

## 2015-10-31 ENCOUNTER — Other Ambulatory Visit: Payer: Self-pay

## 2015-10-31 MED ORDER — METOLAZONE 2.5 MG PO TABS: ORAL_TABLET | ORAL | Status: DC

## 2015-11-11 ENCOUNTER — Other Ambulatory Visit: Payer: Self-pay | Admitting: Internal Medicine

## 2015-11-29 ENCOUNTER — Other Ambulatory Visit: Payer: Self-pay | Admitting: Internal Medicine

## 2015-12-03 ENCOUNTER — Other Ambulatory Visit: Payer: Self-pay | Admitting: Internal Medicine

## 2016-01-10 ENCOUNTER — Other Ambulatory Visit: Payer: Self-pay | Admitting: Internal Medicine

## 2016-01-12 ENCOUNTER — Encounter: Payer: Self-pay | Admitting: Internal Medicine

## 2016-01-12 ENCOUNTER — Ambulatory Visit (INDEPENDENT_AMBULATORY_CARE_PROVIDER_SITE_OTHER): Payer: Medicare Other | Admitting: Internal Medicine

## 2016-01-12 ENCOUNTER — Other Ambulatory Visit: Payer: Self-pay | Admitting: Internal Medicine

## 2016-01-12 VITALS — BP 136/62 | HR 67 | Temp 97.9°F | Resp 16 | Ht 70.0 in | Wt 199.0 lb

## 2016-01-12 DIAGNOSIS — Z794 Long term (current) use of insulin: Secondary | ICD-10-CM | POA: Diagnosis not present

## 2016-01-12 DIAGNOSIS — I10 Essential (primary) hypertension: Secondary | ICD-10-CM | POA: Diagnosis not present

## 2016-01-12 DIAGNOSIS — E0821 Diabetes mellitus due to underlying condition with diabetic nephropathy: Secondary | ICD-10-CM | POA: Diagnosis not present

## 2016-01-12 NOTE — Patient Instructions (Signed)
We do not need to change the medicines today or the insulin.   We do not need blood work today.   Come back in about 6 months for a physical

## 2016-01-12 NOTE — Assessment & Plan Note (Signed)
BP at goal on regimen which is not altered today. Seeing nephrology for his concurrent CKD stage 5.

## 2016-01-12 NOTE — Assessment & Plan Note (Signed)
HgA1c not an accurate depiction in his CKD and monitoring with sugar logs reviewed today indicate good control. He is not a good candidate for dialysis and they are not pursuing that option. No low sugars so will keep humalog 5 units with meals, lantus 20 units daily. Appetite is good and consistent.

## 2016-01-12 NOTE — Progress Notes (Signed)
   Subjective:    Patient ID: Casey Grimes, male    DOB: 10/16/1928, 82 y.o.   MRN: [id number]  HPI The patient is an 82 YO man coming in for follow up of his diabetes. His wife is with him and helps to provide history. His sugars in the morning are typically <180 and generally around 130. He does use insulin and since we changed the lantus dosing he no longer has low sugars. No new concerns. No medication side effects. Seeing nephrology mid July for follow up. Still agrees that dialysis is not a reasonable option and they are not pursuing that.   Review of Systems  Unable to perform ROS: Dementia  Constitutional: Negative for fever, chills, activity change, appetite change and fatigue.  Respiratory: Negative for cough, chest tightness, shortness of breath and wheezing.   Cardiovascular: Negative for chest pain, palpitations and leg swelling.  Gastrointestinal: Negative for abdominal pain, diarrhea, constipation and abdominal distention.  Musculoskeletal: Positive for gait problem.       Slow with walker  Skin: Negative for wound.  Neurological: Negative for dizziness, syncope, weakness, light-headedness and headaches.  Psychiatric/Behavioral: Negative.       Objective:   Physical Exam  Constitutional: He appears well-developed and well-nourished.  Speech slow but content intact  HENT:  Head: Normocephalic and atraumatic.  Eyes: EOM are normal.  Neck: Normal range of motion. Neck supple. No JVD present.  Cardiovascular: Normal rate and regular rhythm.   No murmur heard. Pulmonary/Chest: Effort normal. No respiratory distress. He has no wheezes. He has no rales. He exhibits no tenderness.  Abdominal: Soft. Bowel sounds are normal. He exhibits no distension. There is no tenderness. There is no rebound.  Neurological: He is alert.  Patient walked well with walker but slow with standing.   Skin: Skin is warm and dry.   Filed Vitals:   01/12/16 1050  BP: 136/62  Pulse: 67  Temp:  97.9 F (36.6 C)  TempSrc: Oral  Resp: 16  Height: 5\' 10"  (1.778 m)  Weight: 199 lb (90.266 kg)  SpO2: 98%      Assessment & Plan:

## 2016-01-12 NOTE — Progress Notes (Signed)
Pre visit review using our clinic review tool, if applicable. No additional management support is needed unless otherwise documented below in the visit note. 

## 2016-01-14 ENCOUNTER — Other Ambulatory Visit: Payer: Self-pay | Admitting: Internal Medicine

## 2016-02-23 ENCOUNTER — Other Ambulatory Visit: Payer: Self-pay | Admitting: Internal Medicine

## 2016-03-11 ENCOUNTER — Other Ambulatory Visit: Payer: Self-pay | Admitting: Cardiovascular Disease

## 2016-03-12 ENCOUNTER — Other Ambulatory Visit: Payer: Self-pay | Admitting: Cardiovascular Disease

## 2016-03-21 ENCOUNTER — Other Ambulatory Visit: Payer: Self-pay | Admitting: Internal Medicine

## 2016-04-02 ENCOUNTER — Ambulatory Visit: Payer: Medicare Other | Admitting: Cardiovascular Disease

## 2016-04-05 ENCOUNTER — Emergency Department (HOSPITAL_COMMUNITY): Payer: Medicare Other

## 2016-04-05 ENCOUNTER — Encounter (HOSPITAL_COMMUNITY): Payer: Self-pay | Admitting: Emergency Medicine

## 2016-04-05 ENCOUNTER — Emergency Department (HOSPITAL_COMMUNITY)
Admission: EM | Admit: 2016-04-05 | Discharge: 2016-04-06 | Disposition: A | Discharge status: Home / Self Care | Payer: Medicare Other | Attending: Emergency Medicine | Admitting: Emergency Medicine

## 2016-04-05 DIAGNOSIS — I132 Hypertensive heart and chronic kidney disease with heart failure and with stage 5 chronic kidney disease, or end stage renal disease: Secondary | ICD-10-CM | POA: Diagnosis not present

## 2016-04-05 DIAGNOSIS — Z8673 Personal history of transient ischemic attack (TIA), and cerebral infarction without residual deficits: Secondary | ICD-10-CM | POA: Diagnosis not present

## 2016-04-05 DIAGNOSIS — Z87891 Personal history of nicotine dependence: Secondary | ICD-10-CM | POA: Insufficient documentation

## 2016-04-05 DIAGNOSIS — W19XXXA Unspecified fall, initial encounter: Secondary | ICD-10-CM | POA: Insufficient documentation

## 2016-04-05 DIAGNOSIS — N185 Chronic kidney disease, stage 5: Secondary | ICD-10-CM | POA: Insufficient documentation

## 2016-04-05 DIAGNOSIS — Y92009 Unspecified place in unspecified non-institutional (private) residence as the place of occurrence of the external cause: Secondary | ICD-10-CM | POA: Insufficient documentation

## 2016-04-05 DIAGNOSIS — E1140 Type 2 diabetes mellitus with diabetic neuropathy, unspecified: Secondary | ICD-10-CM | POA: Insufficient documentation

## 2016-04-05 DIAGNOSIS — Z794 Long term (current) use of insulin: Secondary | ICD-10-CM | POA: Insufficient documentation

## 2016-04-05 DIAGNOSIS — Z7982 Long term (current) use of aspirin: Secondary | ICD-10-CM | POA: Insufficient documentation

## 2016-04-05 DIAGNOSIS — I5032 Chronic diastolic (congestive) heart failure: Secondary | ICD-10-CM | POA: Diagnosis not present

## 2016-04-05 DIAGNOSIS — R4182 Altered mental status, unspecified: Secondary | ICD-10-CM | POA: Diagnosis present

## 2016-04-05 DIAGNOSIS — Y939 Activity, unspecified: Secondary | ICD-10-CM | POA: Diagnosis not present

## 2016-04-05 DIAGNOSIS — Y999 Unspecified external cause status: Secondary | ICD-10-CM | POA: Diagnosis not present

## 2016-04-05 DIAGNOSIS — E1121 Type 2 diabetes mellitus with diabetic nephropathy: Secondary | ICD-10-CM | POA: Diagnosis not present

## 2016-04-05 LAB — I-STAT CHEM 8, ED
BUN: 94 mg/dL — AB (ref 6–20)
CALCIUM ION: 1.15 mmol/L (ref 1.15–1.40)
CREATININE: 3.9 mg/dL — AB (ref 0.61–1.24)
Chloride: 95 mmol/L — ABNORMAL LOW (ref 101–111)
GLUCOSE: 196 mg/dL — AB (ref 65–99)
HCT: 37 % — ABNORMAL LOW (ref 39.0–52.0)
Hemoglobin: 12.6 g/dL — ABNORMAL LOW (ref 13.0–17.0)
Potassium: 3.3 mmol/L — ABNORMAL LOW (ref 3.5–5.1)
Sodium: 137 mmol/L (ref 135–145)
TCO2: 31 mmol/L (ref 0–100)

## 2016-04-05 LAB — COMPREHENSIVE METABOLIC PANEL
ALT: 17 U/L (ref 17–63)
AST: 30 U/L (ref 15–41)
Albumin: 3.4 g/dL — ABNORMAL LOW (ref 3.5–5.0)
Alkaline Phosphatase: 60 U/L (ref 38–126)
Anion gap: 11 (ref 5–15)
BUN: 99 mg/dL — AB (ref 6–20)
CHLORIDE: 98 mmol/L — AB (ref 101–111)
CO2: 26 mmol/L (ref 22–32)
CREATININE: 4.2 mg/dL — AB (ref 0.61–1.24)
Calcium: 9.5 mg/dL (ref 8.9–10.3)
GFR calc Af Amer: 14 mL/min — ABNORMAL LOW (ref >60)
GFR, EST NON AFRICAN AMERICAN: 12 mL/min — AB (ref >60)
GLUCOSE: 202 mg/dL — AB (ref 65–99)
Potassium: 3.4 mmol/L — ABNORMAL LOW (ref 3.5–5.1)
SODIUM: 135 mmol/L (ref 135–145)
Total Bilirubin: 0.6 mg/dL (ref 0.3–1.2)
Total Protein: 7.3 g/dL (ref 6.5–8.1)

## 2016-04-05 LAB — CBC WITH DIFFERENTIAL/PLATELET
BASOS ABS: 0 10*3/uL (ref 0.0–0.1)
Basophils Relative: 0 %
EOS PCT: 9 %
Eosinophils Absolute: 0.4 10*3/uL (ref 0.0–0.7)
HCT: 36.1 % — ABNORMAL LOW (ref 39.0–52.0)
Hemoglobin: 11.7 g/dL — ABNORMAL LOW (ref 13.0–17.0)
LYMPHS PCT: 23 %
Lymphs Abs: 1.1 10*3/uL (ref 0.7–4.0)
MCH: 27.3 pg (ref 26.0–34.0)
MCHC: 32.4 g/dL (ref 30.0–36.0)
MCV: 84.3 fL (ref 78.0–100.0)
Monocytes Absolute: 0.2 10*3/uL (ref 0.1–1.0)
Monocytes Relative: 4 %
NEUTROS ABS: 3.1 10*3/uL (ref 1.7–7.7)
Neutrophils Relative %: 64 %
PLATELETS: 297 10*3/uL (ref 150–400)
RBC: 4.28 MIL/uL (ref 4.22–5.81)
RDW: 15.7 % — ABNORMAL HIGH (ref 11.5–15.5)
WBC: 4.9 10*3/uL (ref 4.0–10.5)

## 2016-04-05 LAB — I-STAT TROPONIN, ED: Troponin i, poc: 0.02 ng/mL (ref 0.00–0.08)

## 2016-04-05 LAB — CBG MONITORING, ED: GLUCOSE-CAPILLARY: 211 mg/dL — AB (ref 65–99)

## 2016-04-05 MED ORDER — ONDANSETRON HCL 4 MG/2ML IJ SOLN
4.0000 mg | Freq: Once | INTRAMUSCULAR | Status: AC
  Administered 2016-04-05: 4 mg via INTRAVENOUS
  Filled 2016-04-05: qty 2

## 2016-04-05 NOTE — ED Notes (Signed)
Pt had incontinent episode of stool. 2 assist to clean.
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
## 2016-04-05 NOTE — ED Triage Notes (Signed)
Per EMS, pt from home with c/o fall, altered mental status, and vomiting. Pt alert to self, denies remembering fall, or hitting his head. BP-120/60, HR-58.

## 2016-04-15 ENCOUNTER — Ambulatory Visit (INDEPENDENT_AMBULATORY_CARE_PROVIDER_SITE_OTHER): Payer: Medicare Other | Admitting: Internal Medicine

## 2016-04-15 ENCOUNTER — Encounter: Payer: Self-pay | Admitting: Internal Medicine

## 2016-04-15 DIAGNOSIS — Z23 Encounter for immunization: Secondary | ICD-10-CM

## 2016-04-15 DIAGNOSIS — E0821 Diabetes mellitus due to underlying condition with diabetic nephropathy: Secondary | ICD-10-CM | POA: Diagnosis not present

## 2016-04-15 DIAGNOSIS — R531 Weakness: Secondary | ICD-10-CM | POA: Diagnosis not present

## 2016-04-15 DIAGNOSIS — Z794 Long term (current) use of insulin: Secondary | ICD-10-CM | POA: Diagnosis not present

## 2016-04-15 NOTE — Assessment & Plan Note (Signed)
He did fall and since it was unwitnessed it is unclear if he was using walker or not when he fell. He denies falls since. Talked to them about home safety today and he will continue to use walker all the time.

## 2016-04-15 NOTE — Patient Instructions (Signed)
We do not need blood work today. We have given you the flu shot.   Fall Prevention in the Home  Falls can cause injuries and can affect people from all age groups. There are many simple things that you can do to make your home safe and to help prevent falls. WHAT CAN I DO ON THE OUTSIDE OF MY HOME?  Regularly repair the edges of walkways and driveways and fix any cracks.  Remove high doorway thresholds.  Trim any shrubbery on the main path into your home.  Use bright outdoor lighting.  Clear walkways of debris and clutter, including tools and rocks.  Regularly check that handrails are securely fastened and in good repair. Both sides of any steps should have handrails.  Install guardrails along the edges of any raised decks or porches.  Have leaves, snow, and ice cleared regularly.  Use sand or salt on walkways during winter months.  In the garage, clean up any spills right away, including grease or oil spills. WHAT CAN I DO IN THE BATHROOM?  Use night lights.  Install grab bars by the toilet and in the tub and shower. Do not use towel bars as grab bars.  Use non-skid mats or decals on the floor of the tub or shower.  If you need to sit down while you are in the shower, use a plastic, non-slip stool.Marland Kitchen.  Keep the floor dry. Immediately clean up any water that spills on the floor.  Remove soap buildup in the tub or shower on a regular basis.  Attach bath mats securely with double-sided non-slip rug tape.  Remove throw rugs and other tripping hazards from the floor. WHAT CAN I DO IN THE BEDROOM?  Use night lights.  Make sure that a bedside light is easy to reach.  Do not use oversized bedding that drapes onto the floor.  Have a firm chair that has side arms to use for getting dressed.  Remove throw rugs and other tripping hazards from the floor. WHAT CAN I DO IN THE KITCHEN?   Clean up any spills right away.  Avoid walking on wet floors.  Place frequently used  items in easy-to-reach places.  If you need to reach for something above you, use a sturdy step stool that has a grab bar.  Keep electrical cables out of the way.  Do not use floor polish or wax that makes floors slippery. If you have to use wax, make sure that it is non-skid floor wax.  Remove throw rugs and other tripping hazards from the floor. WHAT CAN I DO IN THE STAIRWAYS?  Do not leave any items on the stairs.  Make sure that there are handrails on both sides of the stairs. Fix handrails that are broken or loose. Make sure that handrails are as long as the stairways.  Check any carpeting to make sure that it is firmly attached to the stairs. Fix any carpet that is loose or worn.  Avoid having throw rugs at the top or bottom of stairways, or secure the rugs with carpet tape to prevent them from moving.  Make sure that you have a light switch at the top of the stairs and the bottom of the stairs. If you do not have them, have them installed. WHAT ARE SOME OTHER FALL PREVENTION TIPS?  Wear closed-toe shoes that fit well and support your feet. Wear shoes that have rubber soles or low heels.  When you use a stepladder, make sure that it is  completely opened and that the sides are firmly locked. Have someone hold the ladder while you are using it. Do not climb a closed stepladder.  Add color or contrast paint or tape to grab bars and handrails in your home. Place contrasting color strips on the first and last steps.  Use mobility aids as needed, such as canes, walkers, scooters, and crutches.  Turn on lights if it is dark. Replace any light bulbs that burn out.  Set up furniture so that there are clear paths. Keep the furniture in the same spot.  Fix any uneven floor surfaces.  Choose a carpet design that does not hide the edge of steps of a stairway.  Be aware of any and all pets.  Review your medicines with your healthcare provider. Some medicines can cause dizziness or  changes in blood pressure, which increase your risk of falling. Talk with your health care provider about other ways that you can decrease your risk of falls. This may include working with a physical therapist or trainer to improve your strength, balance, and endurance.   This information is not intended to replace advice given to you by your health care provider. Make sure you discuss any questions you have with your health care provider.   Document Released: 06/25/2002 Document Revised: 11/19/2014 Document Reviewed: 08/09/2014 Elsevier Interactive Patient Education [employer].

## 2016-04-15 NOTE — Progress Notes (Signed)
   Subjective:    Patient ID: Casey Grimes, male    DOB: 12/17/1928, 82 y.o.   MRN: [id number]  HPI The patient is an 82 YO man coming in for ER follow up (fall at home in the evening, he was found after noise by family and taken to the ER. He was initially confused to the EMS but this was confirmed to be his usual mental status by family (he has moderate to severe dementia). He denies any pain or problems since that time. He was cleared in the ER of any fracture or injury. His wife is with him and denies changes or problems or recurrent falls since that time. He uses walker all the time at home and wheelchair when traveling.   Review of Systems  Unable to perform ROS: Dementia  Constitutional: Negative for activity change, appetite change, chills, fatigue and fever.  Respiratory: Negative for cough, chest tightness, shortness of breath and wheezing.   Cardiovascular: Negative for chest pain, palpitations and leg swelling.  Gastrointestinal: Negative for abdominal distention, abdominal pain, constipation and diarrhea.  Musculoskeletal: Positive for gait problem.  Skin: Negative for wound.  Neurological: Negative for dizziness, syncope, weakness, light-headedness and headaches.  Psychiatric/Behavioral: Negative.       Objective:   Physical Exam  Constitutional: He appears well-developed and well-nourished.  Speech slow but content intact  HENT:  Head: Normocephalic and atraumatic.  Eyes: EOM are normal.  Neck: Normal range of motion. Neck supple. No JVD present.  Cardiovascular: Normal rate and regular rhythm.   Pulmonary/Chest: Effort normal. No respiratory distress. He has no wheezes. He has no rales. He exhibits no tenderness.  Abdominal: Soft. He exhibits no distension. There is no tenderness. There is no rebound.  Neurological: He is alert.  In wheelchair today.   Skin: Skin is warm and dry.    Vitals:   04/15/16 1032  BP: (!) 140/58  Pulse: 61  Resp: 12  Temp: 98.1 F  (36.7 C)  TempSrc: Oral  SpO2: 98%  Weight: 190 lb 6.4 oz (86.4 kg)  Height: 5\' 10"  (1.778 m)      Assessment & Plan:  Flu shot given at visit.

## 2016-04-15 NOTE — Progress Notes (Signed)
Pre visit review using our clinic review tool, if applicable. No additional management support is needed unless otherwise documented below in the visit note. 

## 2016-04-15 NOTE — Assessment & Plan Note (Signed)
No low blood sugars recently that could have contributed to the fall.

## 2016-04-26 ENCOUNTER — Encounter: Payer: Self-pay | Admitting: Cardiovascular Disease

## 2016-05-05 ENCOUNTER — Ambulatory Visit: Payer: Medicare Other | Admitting: Cardiovascular Disease

## 2016-05-07 ENCOUNTER — Encounter: Payer: Self-pay | Admitting: Cardiovascular Disease

## 2016-05-07 ENCOUNTER — Ambulatory Visit (INDEPENDENT_AMBULATORY_CARE_PROVIDER_SITE_OTHER): Payer: Medicare Other | Admitting: Cardiovascular Disease

## 2016-05-07 VITALS — BP 166/70 | HR 59 | Ht 70.0 in | Wt 195.8 lb

## 2016-05-07 DIAGNOSIS — I5032 Chronic diastolic (congestive) heart failure: Secondary | ICD-10-CM

## 2016-05-07 DIAGNOSIS — I10 Essential (primary) hypertension: Secondary | ICD-10-CM | POA: Diagnosis not present

## 2016-05-07 NOTE — Patient Instructions (Signed)
Medication Instructions:  Your physician recommends that you continue on your current medications as directed. Please refer to the Current Medication list given to you today.   Labwork: None Ordered   Testing/Procedures: None Ordered   Follow-Up: Your physician wants you to follow-up in: 6 months with Dr. Nahser.  You will receive a reminder letter in the mail two months in advance. If you don't receive a letter, please call our office to schedule the follow-up appointment.   If you need a refill on your cardiac medications before your next appointment, please call your pharmacy.   Thank you for choosing [facility]! Renesmay Nesbitt, RN [phone]    
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
## 2016-05-24 ENCOUNTER — Other Ambulatory Visit: Payer: Self-pay | Admitting: Internal Medicine

## 2016-05-26 ENCOUNTER — Other Ambulatory Visit: Payer: Self-pay | Admitting: Internal Medicine

## 2016-06-11 ENCOUNTER — Other Ambulatory Visit: Payer: Self-pay | Admitting: Internal Medicine

## 2016-06-17 ENCOUNTER — Other Ambulatory Visit: Payer: Self-pay | Admitting: Internal Medicine

## 2016-07-23 ENCOUNTER — Other Ambulatory Visit: Payer: Self-pay | Admitting: Internal Medicine

## 2016-07-26 ENCOUNTER — Other Ambulatory Visit: Payer: Self-pay | Admitting: Internal Medicine

## 2016-07-28 ENCOUNTER — Other Ambulatory Visit: Payer: Self-pay | Admitting: Internal Medicine

## 2016-07-30 ENCOUNTER — Telehealth: Payer: Self-pay | Admitting: Internal Medicine

## 2016-07-30 ENCOUNTER — Other Ambulatory Visit: Payer: Self-pay | Admitting: Internal Medicine

## 2016-07-30 MED ORDER — HUMALOG 100 UNIT/ML ~~LOC~~ SOLN: 5.0000 [IU] | Freq: Three times a day (TID) | SUBCUTANEOUS | 11 refills | Status: DC

## 2016-07-30 NOTE — Telephone Encounter (Signed)
Patient is requesting either script or ok to fill on humalog to be sent to [facility] on [address].  Patient will be out on Sunday.

## 2016-07-30 NOTE — Telephone Encounter (Signed)
Humalog refill has been sent in

## 2016-08-01 IMAGING — CR DG CHEST 1V PORT
1 series · 1 of 1 positions shown · non-contrast
Comparison: November 22, 2014

CLINICAL DATA: Shortness of breath for 1 month

EXAM:
PORTABLE CHEST 1 VIEW

[AP]
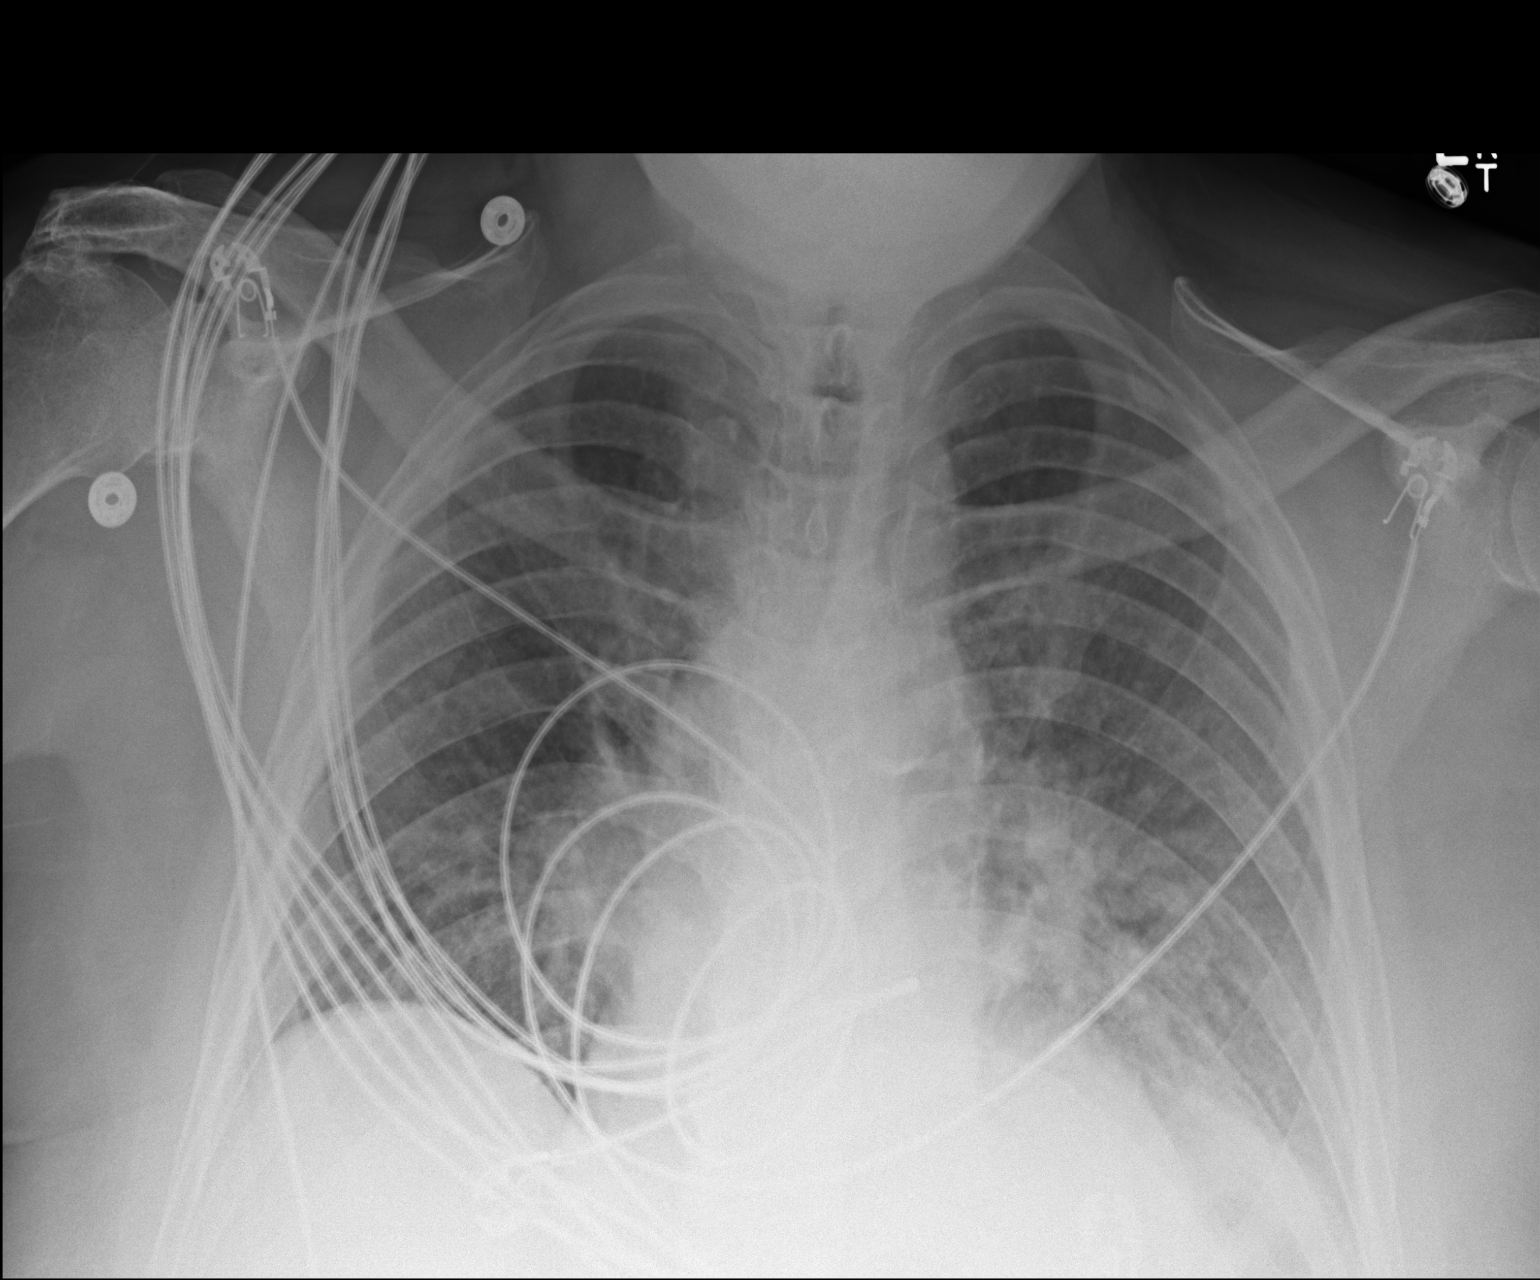

[1 of 1 positions shown; findings below may reference images not displayed]

FINDINGS: There is bilateral lower lobe interstitial edema. There is no
airspace consolidation. Heart is upper normal in size with pulmonary
vascularity within normal limits. No adenopathy. No bone lesions.
IMPRESSION: Bilateral lower lobe interstitial edema. Question a degree of
congestive heart failure. No airspace consolidation. Heart upper
normal in size.

## 2016-08-03 ENCOUNTER — Other Ambulatory Visit: Payer: Self-pay | Admitting: Internal Medicine

## 2016-08-03 MED ORDER — HUMALOG 100 UNIT/ML ~~LOC~~ SOLN: 5.0000 [IU] | Freq: Three times a day (TID) | SUBCUTANEOUS | 11 refills | Status: DC

## 2016-08-03 NOTE — Telephone Encounter (Signed)
Rec'd call wife states pt needing refill on his Humalog. Inform wife per chart med was refilled on 07/30/16. Rx was not change over to normal did not go through. Inform [facility]..../lmb

## 2016-08-06 ENCOUNTER — Other Ambulatory Visit: Payer: Self-pay | Admitting: Internal Medicine

## 2016-08-15 ENCOUNTER — Other Ambulatory Visit: Payer: Self-pay | Admitting: Internal Medicine

## 2016-08-25 ENCOUNTER — Other Ambulatory Visit: Payer: Self-pay | Admitting: Internal Medicine

## 2016-08-28 ENCOUNTER — Other Ambulatory Visit: Payer: Self-pay | Admitting: Internal Medicine

## 2016-09-26 ENCOUNTER — Other Ambulatory Visit: Payer: Self-pay | Admitting: Internal Medicine

## 2016-09-27 ENCOUNTER — Other Ambulatory Visit: Payer: Self-pay | Admitting: Internal Medicine

## 2016-10-12 ENCOUNTER — Encounter: Payer: Self-pay | Admitting: Internal Medicine

## 2016-10-12 ENCOUNTER — Ambulatory Visit (INDEPENDENT_AMBULATORY_CARE_PROVIDER_SITE_OTHER): Payer: Medicare Other | Admitting: Internal Medicine

## 2016-10-12 ENCOUNTER — Ambulatory Visit: Payer: Medicare Other | Admitting: Internal Medicine

## 2016-10-12 VITALS — BP 146/68 | HR 55 | Temp 98.5°F | Resp 14 | Ht 70.0 in | Wt 197.0 lb

## 2016-10-12 DIAGNOSIS — H6121 Impacted cerumen, right ear: Secondary | ICD-10-CM | POA: Diagnosis not present

## 2016-10-12 DIAGNOSIS — E1121 Type 2 diabetes mellitus with diabetic nephropathy: Secondary | ICD-10-CM | POA: Diagnosis not present

## 2016-10-12 DIAGNOSIS — E0821 Diabetes mellitus due to underlying condition with diabetic nephropathy: Secondary | ICD-10-CM

## 2016-10-12 DIAGNOSIS — Z794 Long term (current) use of insulin: Secondary | ICD-10-CM

## 2016-10-12 DIAGNOSIS — E1142 Type 2 diabetes mellitus with diabetic polyneuropathy: Secondary | ICD-10-CM

## 2016-10-12 DIAGNOSIS — I10 Essential (primary) hypertension: Secondary | ICD-10-CM | POA: Diagnosis not present

## 2016-10-12 DIAGNOSIS — I5032 Chronic diastolic (congestive) heart failure: Secondary | ICD-10-CM

## 2016-10-12 NOTE — Progress Notes (Signed)
Pre visit review using our clinic review tool, if applicable. No additional management support is needed unless otherwise documented below in the visit note.  Right ear cleaning  

## 2016-10-12 NOTE — Assessment & Plan Note (Signed)
Stable, no signs of fluid overload. Taking torsemide and coreg.

## 2016-10-12 NOTE — Assessment & Plan Note (Addendum)
BP close to goal and being monitored as well by nephrology and kidney function stable at CKD stage 5. Taking amlodipine, torsemide, coreg, clonidine.

## 2016-10-12 NOTE — Assessment & Plan Note (Signed)
Right ear lavage with improved hearing immediately after finished. Advised if hearing changes return to call the office. No sinus congestion on exam.

## 2016-10-12 NOTE — Assessment & Plan Note (Signed)
Foot exam done, with his CKD stage 5 HgA1c is not helpful. Sugar log with consistent readings morning 120-140 with a rare 70 in the morning. If he has more will need to decrease lantus from 20 units to 18. Taking humalog 5 units only if needed with meals based on their sliding scale.

## 2016-10-12 NOTE — Assessment & Plan Note (Signed)
Stable from prior. Still in the feet and legs and does cause some balance problems.

## 2016-10-12 NOTE — Patient Instructions (Signed)
Keep tracking the sugars and if he is having more sugars around 70 in the morning call the office. We may need to adjust the lantus medicine.

## 2016-10-12 NOTE — Progress Notes (Signed)
   Subjective:    Patient ID: Casey Grimes, male    DOB: 01/05/1929, 83 y.o.   MRN: [id number]  HPI The patient is an 83 YO man coming in for several problems including follow up of his sugars (did not bring sugar log but his wife does know the sugars, one morning sugar of 70 others are around 120-140, still with CKD stage 5 and peripheral neuropathy which affects his balance, overall stable on lantus and humalog), and his blood pressure (taking coreg, torsemide, clonidine, amlodipine, complicated by ckd stage 5, stable creatinine 2 weeks ago with nephrology per wife's report, no side effects or dizziness from medications, denies chest pains or SOB). Also he has a new problem of hearing loss (likely due to ear wax, no ringing in the ear, less responsive to his wife due to hearing, she can get his attention, denies pain or sinus pressure).   Review of Systems  Unable to perform ROS: Dementia      Objective:   Physical Exam  Constitutional: He appears well-developed and well-nourished.  HENT:  Head: Normocephalic and atraumatic.  Left Ear: External ear normal.  Mouth/Throat: Oropharynx is clear and moist.  Right ear impacted with wax, after lavage TM normal.  Eyes: EOM are normal.  Neck: Normal range of motion.  Cardiovascular: Normal rate and regular rhythm.   Pulmonary/Chest: Effort normal and breath sounds normal. No respiratory distress. He has no wheezes.  Abdominal: Soft. He exhibits no distension. There is no tenderness. There is no rebound.  Musculoskeletal: He exhibits no edema.  Neurological: He is alert. Coordination abnormal.  Walker for ambulation  Skin: Skin is warm and dry.  Foot exam done   Vitals:   10/12/16 0927  BP: (!) 146/68  Pulse: (!) 55  Resp: 14  Temp: 98.5 F (36.9 C)  TempSrc: Oral  SpO2: 99%  Weight: 197 lb (89.4 kg)  Height: 5\' 10"  (1.778 m)      Assessment & Plan:

## 2016-10-18 ENCOUNTER — Telehealth: Payer: Self-pay | Admitting: Cardiovascular Disease

## 2016-10-18 NOTE — Telephone Encounter (Signed)
New Message  Critical lab  Unexplained weight gain

## 2016-10-18 NOTE — Telephone Encounter (Signed)
Spoke with Tim from [facility] Heart Failure program.  He is reporting pt's wt has gone from 185 lbs to 206.2 lbs since Dr Lowell Guitar decreased his Torsemide from 20 mg (2) tablets twice a day to 20 mg a day and stopped his metolazone.  Pt denies any SOB or edema.  Tim will continue to monitor wts and s/s.  He has notified Dr Lowell Guitar of wt increase.  He will c/b if wts continue to increase or if pt starts to develop s/s.

## 2016-10-21 ENCOUNTER — Other Ambulatory Visit: Payer: Self-pay | Admitting: Internal Medicine

## 2016-10-22 ENCOUNTER — Other Ambulatory Visit: Payer: Self-pay | Admitting: Internal Medicine

## 2016-10-22 NOTE — Telephone Encounter (Signed)
Dr. Lowell Guitar has been managing his diuretics and renal issues

## 2016-10-23 ENCOUNTER — Other Ambulatory Visit: Payer: Self-pay | Admitting: Internal Medicine

## 2016-10-25 ENCOUNTER — Telehealth: Payer: Self-pay | Admitting: Internal Medicine

## 2016-10-25 NOTE — Telephone Encounter (Signed)
BS has been really low in morning 59, 60, 63. Is weak in the morning.

## 2016-10-25 NOTE — Telephone Encounter (Signed)
Contacted and stated awareness 

## 2016-10-25 NOTE — Telephone Encounter (Signed)
Have them decrease lantus by 5 units.

## 2016-10-27 ENCOUNTER — Other Ambulatory Visit: Payer: Self-pay | Admitting: Internal Medicine

## 2016-11-08 ENCOUNTER — Telehealth: Payer: Self-pay | Admitting: Internal Medicine

## 2016-11-08 NOTE — Telephone Encounter (Signed)
Pleas advise.

## 2016-11-08 NOTE — Telephone Encounter (Signed)
Recommend visit today

## 2016-11-08 NOTE — Telephone Encounter (Signed)
Will you call patient, my phone is messing up

## 2016-11-08 NOTE — Telephone Encounter (Signed)
Pt wife called in and said that pt BP has been around 192/117 for the last 3 days.  He is telling her that he feels fine but she says that he doesn't look like it.  And he is having shortness of breath.  She would like a call back

## 2016-11-09 ENCOUNTER — Observation Stay (HOSPITAL_COMMUNITY)
Admission: EM | Admit: 2016-11-09 | Discharge: 2016-11-11 | Disposition: A | Discharge status: Home Health | Payer: Medicare Other | Attending: Internal Medicine | Admitting: Internal Medicine

## 2016-11-09 ENCOUNTER — Emergency Department (HOSPITAL_COMMUNITY): Payer: Medicare Other

## 2016-11-09 ENCOUNTER — Encounter (HOSPITAL_COMMUNITY): Payer: Self-pay

## 2016-11-09 DIAGNOSIS — Z79899 Other long term (current) drug therapy: Secondary | ICD-10-CM | POA: Insufficient documentation

## 2016-11-09 DIAGNOSIS — Z87891 Personal history of nicotine dependence: Secondary | ICD-10-CM | POA: Insufficient documentation

## 2016-11-09 DIAGNOSIS — I132 Hypertensive heart and chronic kidney disease with heart failure and with stage 5 chronic kidney disease, or end stage renal disease: Secondary | ICD-10-CM | POA: Diagnosis not present

## 2016-11-09 DIAGNOSIS — N189 Chronic kidney disease, unspecified: Secondary | ICD-10-CM | POA: Diagnosis present

## 2016-11-09 DIAGNOSIS — J9601 Acute respiratory failure with hypoxia: Secondary | ICD-10-CM | POA: Diagnosis not present

## 2016-11-09 DIAGNOSIS — F0390 Unspecified dementia without behavioral disturbance: Secondary | ICD-10-CM | POA: Diagnosis not present

## 2016-11-09 DIAGNOSIS — Z9842 Cataract extraction status, left eye: Secondary | ICD-10-CM | POA: Diagnosis not present

## 2016-11-09 DIAGNOSIS — Z794 Long term (current) use of insulin: Secondary | ICD-10-CM | POA: Insufficient documentation

## 2016-11-09 DIAGNOSIS — I5033 Acute on chronic diastolic (congestive) heart failure: Secondary | ICD-10-CM | POA: Diagnosis not present

## 2016-11-09 DIAGNOSIS — R0682 Tachypnea, not elsewhere classified: Secondary | ICD-10-CM

## 2016-11-09 DIAGNOSIS — H1032 Unspecified acute conjunctivitis, left eye: Secondary | ICD-10-CM | POA: Diagnosis not present

## 2016-11-09 DIAGNOSIS — E785 Hyperlipidemia, unspecified: Secondary | ICD-10-CM | POA: Diagnosis not present

## 2016-11-09 DIAGNOSIS — Z7982 Long term (current) use of aspirin: Secondary | ICD-10-CM | POA: Diagnosis not present

## 2016-11-09 DIAGNOSIS — Z9841 Cataract extraction status, right eye: Secondary | ICD-10-CM | POA: Insufficient documentation

## 2016-11-09 DIAGNOSIS — R0902 Hypoxemia: Secondary | ICD-10-CM

## 2016-11-09 DIAGNOSIS — E1121 Type 2 diabetes mellitus with diabetic nephropathy: Secondary | ICD-10-CM | POA: Diagnosis present

## 2016-11-09 DIAGNOSIS — Z8249 Family history of ischemic heart disease and other diseases of the circulatory system: Secondary | ICD-10-CM | POA: Diagnosis not present

## 2016-11-09 DIAGNOSIS — D631 Anemia in chronic kidney disease: Secondary | ICD-10-CM | POA: Diagnosis present

## 2016-11-09 DIAGNOSIS — F329 Major depressive disorder, single episode, unspecified: Secondary | ICD-10-CM | POA: Diagnosis not present

## 2016-11-09 DIAGNOSIS — R339 Retention of urine, unspecified: Secondary | ICD-10-CM | POA: Diagnosis not present

## 2016-11-09 DIAGNOSIS — J9621 Acute and chronic respiratory failure with hypoxia: Secondary | ICD-10-CM | POA: Insufficient documentation

## 2016-11-09 DIAGNOSIS — Z8673 Personal history of transient ischemic attack (TIA), and cerebral infarction without residual deficits: Secondary | ICD-10-CM | POA: Diagnosis not present

## 2016-11-09 DIAGNOSIS — N186 End stage renal disease: Secondary | ICD-10-CM | POA: Insufficient documentation

## 2016-11-09 DIAGNOSIS — R338 Other retention of urine: Secondary | ICD-10-CM | POA: Diagnosis not present

## 2016-11-09 DIAGNOSIS — E1122 Type 2 diabetes mellitus with diabetic chronic kidney disease: Secondary | ICD-10-CM | POA: Insufficient documentation

## 2016-11-09 DIAGNOSIS — I10 Essential (primary) hypertension: Secondary | ICD-10-CM | POA: Diagnosis present

## 2016-11-09 DIAGNOSIS — N185 Chronic kidney disease, stage 5: Secondary | ICD-10-CM | POA: Diagnosis not present

## 2016-11-09 DIAGNOSIS — E118 Type 2 diabetes mellitus with unspecified complications: Secondary | ICD-10-CM | POA: Diagnosis not present

## 2016-11-09 DIAGNOSIS — E0821 Diabetes mellitus due to underlying condition with diabetic nephropathy: Secondary | ICD-10-CM

## 2016-11-09 DIAGNOSIS — Z961 Presence of intraocular lens: Secondary | ICD-10-CM | POA: Diagnosis not present

## 2016-11-09 LAB — URINALYSIS, ROUTINE W REFLEX MICROSCOPIC
BACTERIA UA: NONE SEEN
BILIRUBIN URINE: NEGATIVE
Glucose, UA: NEGATIVE mg/dL
HGB URINE DIPSTICK: NEGATIVE
Ketones, ur: NEGATIVE mg/dL
LEUKOCYTES UA: NEGATIVE
NITRITE: NEGATIVE
PH: 6 (ref 5.0–8.0)
Protein, ur: >=300 mg/dL — AB
SPECIFIC GRAVITY, URINE: 1.011 (ref 1.005–1.030)

## 2016-11-09 LAB — CBC WITH DIFFERENTIAL/PLATELET
BASOS ABS: 0 10*3/uL (ref 0.0–0.1)
BASOS PCT: 0 %
EOS ABS: 0.3 10*3/uL (ref 0.0–0.7)
Eosinophils Relative: 4 %
HEMATOCRIT: 31.9 % — AB (ref 39.0–52.0)
HEMOGLOBIN: 10.4 g/dL — AB (ref 13.0–17.0)
Lymphocytes Relative: 20 %
Lymphs Abs: 1.5 10*3/uL (ref 0.7–4.0)
MCH: 27.7 pg (ref 26.0–34.0)
MCHC: 32.6 g/dL (ref 30.0–36.0)
MCV: 85.1 fL (ref 78.0–100.0)
MONOS PCT: 6 %
Monocytes Absolute: 0.4 10*3/uL (ref 0.1–1.0)
NEUTROS ABS: 5 10*3/uL (ref 1.7–7.7)
NEUTROS PCT: 70 %
Platelets: 338 10*3/uL (ref 150–400)
RBC: 3.75 MIL/uL — AB (ref 4.22–5.81)
RDW: 17.3 % — ABNORMAL HIGH (ref 11.5–15.5)
WBC: 7.2 10*3/uL (ref 4.0–10.5)

## 2016-11-09 LAB — GLUCOSE, CAPILLARY
Glucose-Capillary: 113 mg/dL — ABNORMAL HIGH (ref 65–99)
Glucose-Capillary: 124 mg/dL — ABNORMAL HIGH (ref 65–99)
Glucose-Capillary: 204 mg/dL — ABNORMAL HIGH (ref 65–99)
Glucose-Capillary: 68 mg/dL (ref 65–99)

## 2016-11-09 LAB — COMPREHENSIVE METABOLIC PANEL
ALK PHOS: 54 U/L (ref 38–126)
ALT: 17 U/L (ref 17–63)
ANION GAP: 10 (ref 5–15)
AST: 26 U/L (ref 15–41)
Albumin: 3.3 g/dL — ABNORMAL LOW (ref 3.5–5.0)
BUN: 42 mg/dL — ABNORMAL HIGH (ref 6–20)
CALCIUM: 9.3 mg/dL (ref 8.9–10.3)
CO2: 22 mmol/L (ref 22–32)
CREATININE: 3.39 mg/dL — AB (ref 0.61–1.24)
Chloride: 110 mmol/L (ref 101–111)
GFR, EST AFRICAN AMERICAN: 17 mL/min — AB (ref >60)
GFR, EST NON AFRICAN AMERICAN: 15 mL/min — AB (ref >60)
Glucose, Bld: 177 mg/dL — ABNORMAL HIGH (ref 65–99)
Potassium: 4.3 mmol/L (ref 3.5–5.1)
Sodium: 142 mmol/L (ref 135–145)
Total Bilirubin: 0.6 mg/dL (ref 0.3–1.2)
Total Protein: 7.7 g/dL (ref 6.5–8.1)

## 2016-11-09 LAB — I-STAT TROPONIN, ED: TROPONIN I, POC: 0.02 ng/mL (ref 0.00–0.08)

## 2016-11-09 LAB — BRAIN NATRIURETIC PEPTIDE: B NATRIURETIC PEPTIDE 5: 520.2 pg/mL — AB (ref 0.0–100.0)

## 2016-11-09 LAB — PROCALCITONIN: PROCALCITONIN: 0.45 ng/mL

## 2016-11-09 MED ORDER — TOBRAMYCIN 0.3 % OP SOLN
1.0000 [drp] | Freq: Four times a day (QID) | OPHTHALMIC | Status: DC
  Administered 2016-11-09 – 2016-11-11 (×9): 1 [drp] via OPHTHALMIC
  Filled 2016-11-09: qty 5

## 2016-11-09 MED ORDER — INSULIN GLARGINE 100 UNIT/ML ~~LOC~~ SOLN
20.0000 [IU] | Freq: Every day | SUBCUTANEOUS | Status: DC
  Administered 2016-11-10: 20 [IU] via SUBCUTANEOUS
  Filled 2016-11-09 (×2): qty 0.2

## 2016-11-09 MED ORDER — ATORVASTATIN CALCIUM 10 MG PO TABS
10.0000 mg | ORAL_TABLET | Freq: Every day | ORAL | Status: DC
  Administered 2016-11-10: 10 mg via ORAL
  Filled 2016-11-09: qty 1

## 2016-11-09 MED ORDER — AMLODIPINE BESYLATE 5 MG PO TABS
10.0000 mg | ORAL_TABLET | Freq: Every day | ORAL | Status: DC
  Administered 2016-11-10 – 2016-11-11 (×2): 10 mg via ORAL
  Filled 2016-11-09 (×2): qty 2

## 2016-11-09 MED ORDER — HYDRALAZINE HCL 25 MG PO TABS
50.0000 mg | ORAL_TABLET | Freq: Four times a day (QID) | ORAL | Status: DC
  Administered 2016-11-09 – 2016-11-11 (×9): 50 mg via ORAL
  Filled 2016-11-09 (×9): qty 2

## 2016-11-09 MED ORDER — CARVEDILOL 12.5 MG PO TABS
25.0000 mg | ORAL_TABLET | Freq: Two times a day (BID) | ORAL | Status: DC
  Administered 2016-11-09 – 2016-11-11 (×4): 25 mg via ORAL
  Filled 2016-11-09 (×4): qty 2

## 2016-11-09 MED ORDER — HEPARIN SODIUM (PORCINE) 5000 UNIT/ML IJ SOLN
5000.0000 [IU] | Freq: Three times a day (TID) | INTRAMUSCULAR | Status: DC
  Administered 2016-11-09 – 2016-11-11 (×6): 5000 [IU] via SUBCUTANEOUS
  Filled 2016-11-09 (×6): qty 1

## 2016-11-09 MED ORDER — INSULIN ASPART 100 UNIT/ML ~~LOC~~ SOLN
0.0000 [IU] | Freq: Three times a day (TID) | SUBCUTANEOUS | Status: DC
  Administered 2016-11-09: 1 [IU] via SUBCUTANEOUS
  Administered 2016-11-09: 3 [IU] via SUBCUTANEOUS
  Administered 2016-11-10: 2 [IU] via SUBCUTANEOUS
  Administered 2016-11-10: 1 [IU] via SUBCUTANEOUS
  Administered 2016-11-10: 5 [IU] via SUBCUTANEOUS
  Administered 2016-11-11: 1 [IU] via SUBCUTANEOUS

## 2016-11-09 MED ORDER — DEXTROSE 5 % IV SOLN
1.0000 g | Freq: Once | INTRAVENOUS | Status: AC
  Administered 2016-11-09: 1 g via INTRAVENOUS
  Filled 2016-11-09: qty 10

## 2016-11-09 MED ORDER — ONDANSETRON HCL 4 MG/2ML IJ SOLN: 4.0000 mg | Freq: Four times a day (QID) | INTRAMUSCULAR | Status: DC | PRN

## 2016-11-09 MED ORDER — POTASSIUM CHLORIDE CRYS ER 20 MEQ PO TBCR
20.0000 meq | EXTENDED_RELEASE_TABLET | Freq: Every day | ORAL | Status: DC
  Administered 2016-11-10 – 2016-11-11 (×2): 20 meq via ORAL
  Filled 2016-11-09 (×2): qty 1

## 2016-11-09 MED ORDER — INSULIN ASPART 100 UNIT/ML ~~LOC~~ SOLN: 0.0000 [IU] | Freq: Every day | SUBCUTANEOUS | Status: DC

## 2016-11-09 MED ORDER — CLONIDINE HCL 0.2 MG PO TABS
0.2000 mg | ORAL_TABLET | Freq: Two times a day (BID) | ORAL | Status: DC
  Administered 2016-11-09 – 2016-11-11 (×4): 0.2 mg via ORAL
  Filled 2016-11-09 (×4): qty 1

## 2016-11-09 MED ORDER — ENALAPRILAT 1.25 MG/ML IV SOLN
1.2500 mg | Freq: Once | INTRAVENOUS | Status: AC
  Administered 2016-11-09: 1.25 mg via INTRAVENOUS
  Filled 2016-11-09: qty 1

## 2016-11-09 MED ORDER — SODIUM CHLORIDE 0.9 % IV SOLN: 250.0000 mL | INTRAVENOUS | Status: DC | PRN

## 2016-11-09 MED ORDER — SODIUM CHLORIDE 0.9% FLUSH
3.0000 mL | Freq: Two times a day (BID) | INTRAVENOUS | Status: DC
  Administered 2016-11-09 – 2016-11-11 (×5): 3 mL via INTRAVENOUS

## 2016-11-09 MED ORDER — FUROSEMIDE 10 MG/ML IJ SOLN
80.0000 mg | Freq: Two times a day (BID) | INTRAMUSCULAR | Status: AC
  Administered 2016-11-09 – 2016-11-11 (×4): 80 mg via INTRAVENOUS
  Filled 2016-11-09 (×4): qty 8

## 2016-11-09 MED ORDER — ACETAMINOPHEN 325 MG PO TABS: 650.0000 mg | ORAL_TABLET | ORAL | Status: DC | PRN

## 2016-11-09 MED ORDER — FUROSEMIDE 10 MG/ML IJ SOLN
80.0000 mg | Freq: Once | INTRAMUSCULAR | Status: AC
  Administered 2016-11-09: 80 mg via INTRAVENOUS
  Filled 2016-11-09: qty 8

## 2016-11-09 MED ORDER — DEXTROSE 5 % IV SOLN
500.0000 mg | Freq: Once | INTRAVENOUS | Status: AC
  Administered 2016-11-09: 500 mg via INTRAVENOUS
  Filled 2016-11-09: qty 500

## 2016-11-09 MED ORDER — CALCITRIOL 0.25 MCG PO CAPS
0.2500 ug | ORAL_CAPSULE | Freq: Every day | ORAL | Status: DC
Start: 2016-11-10 — End: 2016-11-11
  Administered 2016-11-10 – 2016-11-11 (×2): 0.25 ug via ORAL
  Filled 2016-11-09 (×2): qty 1

## 2016-11-09 MED ORDER — SODIUM CHLORIDE 0.9% FLUSH: 3.0000 mL | INTRAVENOUS | Status: DC | PRN

## 2016-11-09 MED ORDER — ATORVASTATIN CALCIUM 10 MG PO TABS: 10.0000 mg | ORAL_TABLET | Freq: Every day | ORAL | Status: DC
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
## 2016-11-09 NOTE — ED Notes (Signed)
Bladder scanned pt, urine 689>. Notified Whitney(RN)

## 2016-11-09 NOTE — ED Notes (Signed)
Attempted report x1. 

## 2016-11-09 NOTE — Progress Notes (Signed)
Performed bladder scan on pt to insure that he is not retaining urine.  Bladder scan revealed <200cc.  Pt is voiding without complication.  Therefore, per [place], AD 3 [place], it is unnecessary to insert Foley catheter.

## 2016-11-09 NOTE — ED Triage Notes (Signed)
Pt. Coming from home via [facility] for SOB worsening since yesterday. Pt. Hx of CHF, dyspnea, and orthopnea. EDP at bedside. Pt. Noted to have labored breathing with accessory muscle usage, JVD. Pt. Denies any pain. EMS reports 12-lead unremarkable.

## 2016-11-10 ENCOUNTER — Observation Stay (HOSPITAL_BASED_OUTPATIENT_CLINIC_OR_DEPARTMENT_OTHER): Payer: Medicare Other

## 2016-11-10 ENCOUNTER — Observation Stay (HOSPITAL_COMMUNITY): Payer: Medicare Other

## 2016-11-10 DIAGNOSIS — D631 Anemia in chronic kidney disease: Secondary | ICD-10-CM | POA: Diagnosis not present

## 2016-11-10 DIAGNOSIS — I5033 Acute on chronic diastolic (congestive) heart failure: Secondary | ICD-10-CM

## 2016-11-10 DIAGNOSIS — E0821 Diabetes mellitus due to underlying condition with diabetic nephropathy: Secondary | ICD-10-CM | POA: Diagnosis not present

## 2016-11-10 DIAGNOSIS — N185 Chronic kidney disease, stage 5: Secondary | ICD-10-CM | POA: Diagnosis not present

## 2016-11-10 DIAGNOSIS — F0280 Dementia in other diseases classified elsewhere without behavioral disturbance: Secondary | ICD-10-CM | POA: Diagnosis not present

## 2016-11-10 DIAGNOSIS — I509 Heart failure, unspecified: Secondary | ICD-10-CM

## 2016-11-10 DIAGNOSIS — R338 Other retention of urine: Secondary | ICD-10-CM | POA: Diagnosis not present

## 2016-11-10 DIAGNOSIS — J9601 Acute respiratory failure with hypoxia: Secondary | ICD-10-CM | POA: Diagnosis not present

## 2016-11-10 DIAGNOSIS — N189 Chronic kidney disease, unspecified: Secondary | ICD-10-CM | POA: Diagnosis not present

## 2016-11-10 DIAGNOSIS — G309 Alzheimer's disease, unspecified: Secondary | ICD-10-CM | POA: Diagnosis not present

## 2016-11-10 LAB — GLUCOSE, CAPILLARY
GLUCOSE-CAPILLARY: 136 mg/dL — AB (ref 65–99)
GLUCOSE-CAPILLARY: 97 mg/dL (ref 65–99)
Glucose-Capillary: 197 mg/dL — ABNORMAL HIGH (ref 65–99)
Glucose-Capillary: 263 mg/dL — ABNORMAL HIGH (ref 65–99)

## 2016-11-10 LAB — BASIC METABOLIC PANEL
Anion gap: 9 (ref 5–15)
BUN: 40 mg/dL — ABNORMAL HIGH (ref 6–20)
CO2: 22 mmol/L (ref 22–32)
Calcium: 8.9 mg/dL (ref 8.9–10.3)
Chloride: 108 mmol/L (ref 101–111)
Creatinine, Ser: 3.22 mg/dL — ABNORMAL HIGH (ref 0.61–1.24)
GFR, EST AFRICAN AMERICAN: 19 mL/min — AB (ref >60)
GFR, EST NON AFRICAN AMERICAN: 16 mL/min — AB (ref >60)
GLUCOSE: 134 mg/dL — AB (ref 65–99)
POTASSIUM: 3.8 mmol/L (ref 3.5–5.1)
SODIUM: 139 mmol/L (ref 135–145)

## 2016-11-10 LAB — HEMOGLOBIN A1C
Hgb A1c MFr Bld: 6.7 % — ABNORMAL HIGH (ref 4.8–5.6)
MEAN PLASMA GLUCOSE: 146 mg/dL

## 2016-11-10 LAB — ECHOCARDIOGRAM COMPLETE
Height: 71 in
WEIGHTICAEL: 3278.4 [oz_av]

## 2016-11-10 MED ORDER — DEXTROSE 5 % IV SOLN
1.0000 g | INTRAVENOUS | Status: DC
  Administered 2016-11-10: 1 g via INTRAVENOUS
  Filled 2016-11-10 (×2): qty 10

## 2016-11-10 MED ORDER — AZITHROMYCIN 500 MG IV SOLR
500.0000 mg | INTRAVENOUS | Status: DC
  Administered 2016-11-10: 500 mg via INTRAVENOUS
  Filled 2016-11-10 (×2): qty 500

## 2016-11-10 NOTE — Progress Notes (Signed)
Pt. With CBG of 68 and refusing snack. RN able to get pt. To drink cup of orange juice. CBG back up WNL. Pt. Alert and stable. No s/s of distress noted. RN will continue to monitor pt. For changes in condition. Gram Siedlecki, Cheryll Dessert

## 2016-11-10 NOTE — Progress Notes (Signed)
Patient without complaint during 7 a to 7 p shift other than he wants to go home.  Patient lives with wife and is anxious to get back home with her.  RN did call patients wife and let him speak to her on the phone which seemed to reassure him.  Patient ate very little today, refusing to eat any lunch or dinner, did drink an Ensure.  Per patients sons he usually does this when he is in the hospital because he is upset his wife is not here.  Wife has a knee injury that has prevented her from visiting.    VSS, remains on room air.  Echo performed this shift.

## 2016-11-10 NOTE — Plan of Care (Signed)
Problem: Physical Regulation: Goal: Ability to maintain clinical measurements within normal limits will improve Outcome: Progressing Patient VSS, maintaining oxygen level in 90's on room air   Problem: Tissue Perfusion: Goal: Risk factors for ineffective tissue perfusion will decrease Outcome: Progressing Oxygenation on room air in the 90's   Problem: Activity: Goal: Capacity to carry out activities will improve Outcome: Progressing Patient uses walker at baseline, used walker today with 1 assist to ambulate

## 2016-11-10 NOTE — Progress Notes (Signed)
PROGRESS NOTE    Casey Grimes  XLK:[id number] DOB: 1929/05/21 DOA: 11/09/2016 PCP: Myrlene Broker, MD   Outpatient Specialists:    Brief Narrative:  Casey Grimes is a 83 y.o. male who presents with acute respiratory failure w/ hypoxia secondary to pulmonary edema and questionable infectious process in the setting of CKD 5 (not a dialysis candidate) and CHF exacerbation. Pt recently on decreased dose of torsemide which likely precipitated event.    Assessment & Plan:   Principal Problem:   Acute on chronic diastolic heart failure (HCC) Active Problems:   Diabetes mellitus with diabetic nephropathy (HCC)   Uncontrolled hypertension   Anemia due to chronic kidney disease   Hyperlipidemia   History of CVA (cerebrovascular accident)   CKD (chronic kidney disease) stage 5, GFR less than 15 ml/min (HCC)   Dementia   Acute conjunctivitis of left eye   Acute urinary retention    Acute respiratory failure 2/2 Acute on chronic diastolic heart failure  -Patient presents with 3 days worth of shortness of breath and uncontrolled blood pressure with apparent weight gain of about 12 pounds despite taking home medications as prescribed including diuretics; EDP no dietary indiscretion as well -Hold Demadex in favor of higher dose Lasix 80 mg IV q 12 hrs -No metolazone during acute diuretic phase -Resume home carvedilol, hydralazine and Norvasc to improve BP -No ACE inhibitor secondary to severity of CKD (not a candidate for ESRD) -Daily weights and strict I/O -Last echocardiogram 2016: Grade 2 diastolic dysfunction with moderate concentric hypertrophy no valvular dysfunction-repeat echocardiogram this admission -Does have asymmetric changes in the left lower lobe that could be reflective of pneumonia; has a nonproductive cough, no leukocytosis or fever -procalcitonin slightly elevated so will re-order abx    Uncontrolled hypertension -Secondary to volume overload -Resume home  medications as above   Acute urinary retention -Bladder scan revealed 680 mL so we'll insert Foley catheter    CKD (chronic kidney disease) stage 5, GFR less than 15 ml/min  -Followed by Dr. Lowell Guitar as an outpatient -Current renal function stable and at baseline -If renal function worsens with attempts to diurese may need to consult Dr. Lowell Guitar -Previously documented that patient does not wish to undergo hemodialysis    Diabetes mellitus with diabetic nephropathy  -Current CBGs moderately controlled -Continue Lantus -SSI -HgbA1c    Dementia -lives with wife    Acute conjunctivitis of left eye -Begin tobramycin eyedrops    Anemia due to chronic kidney disease -Baseline hemoglobin September 2017 was 11.7    Hyperlipidemia -Continue statin    History of CVA (cerebrovascular accident) -Continue aspirin and statin   DVT prophylaxis:  SQ Heparin  Code Status: Full Code   Family Communication:   Disposition Plan:     Consultants:       Subjective: Tired this morning  Objective: Vitals:   11/10/16 0240 11/10/16 0320 11/10/16 0956 11/10/16 1241  BP:  (!) 162/63 (!) 179/58 (!) 190/66  Pulse:  65 63 62  Resp:  20    Temp:  99.1 F (37.3 C)    TempSrc:  Oral    SpO2:  96% 98% 100%  Weight: 92.9 kg (204 lb 14.4 oz)     Height:        Intake/Output Summary (Last 24 hours) at 11/10/16 1258 Last data filed at 11/10/16 1011  Gross per 24 hour  Intake              720 ml  Output  2550 ml  Net            -1830 ml   Filed Weights   11/09/16 0814 11/09/16 1138 11/10/16 0240  Weight: 89.4 kg (197 lb) 95.9 kg (211 lb 6.7 oz) 92.9 kg (204 lb 14.4 oz)    Examination:  General exam: Appears calm and comfortable  Respiratory system: diminished, no increased work of breathing Cardiovascular system: S1 & S2 heard, RRR. No JVD, murmurs, rubs, gallops or clicks. No pedal edema. Gastrointestinal system: Abdomen is nondistended, soft and  nontender. No organomegaly or masses felt. Normal bowel sounds heard. Central nervous system: Alert -- pleasant and cooperative     Data Reviewed: I have personally reviewed following labs and imaging studies  CBC:  Recent Labs Lab 11/09/16 0818  WBC 7.2  NEUTROABS 5.0  HGB 10.4*  HCT 31.9*  MCV 85.1  PLT 338   Basic Metabolic Panel:  Recent Labs Lab 11/09/16 0818 11/10/16 0620  NA 142 139  K 4.3 3.8  CL 110 108  CO2 22 22  GLUCOSE 177* 134*  BUN 42* 40*  CREATININE 3.39* 3.22*  CALCIUM 9.3 8.9   GFR: Estimated Creatinine Clearance: 19.2 mL/min (A) (by C-G formula based on SCr of 3.22 mg/dL (H)). Liver Function Tests:  Recent Labs Lab 11/09/16 0818  AST 26  ALT 17  ALKPHOS 54  BILITOT 0.6  PROT 7.7  ALBUMIN 3.3*   No results for input(s): LIPASE, AMYLASE in the last 168 hours. No results for input(s): AMMONIA in the last 168 hours. Coagulation Profile: No results for input(s): INR, PROTIME in the last 168 hours. Cardiac Enzymes: No results for input(s): CKTOTAL, CKMB, CKMBINDEX, TROPONINI in the last 168 hours. BNP (last 3 results) No results for input(s): PROBNP in the last 8760 hours. HbA1C:  Recent Labs  11/09/16 1049  HGBA1C 6.7*   CBG:  Recent Labs Lab 11/09/16 1645 11/09/16 2053 11/09/16 2341 11/10/16 0749 11/10/16 1123  GLUCAP 124* 68 113* 136* 197*   Lipid Profile: No results for input(s): CHOL, HDL, LDLCALC, TRIG, CHOLHDL, LDLDIRECT in the last 72 hours. Thyroid Function Tests: No results for input(s): TSH, T4TOTAL, FREET4, T3FREE, THYROIDAB in the last 72 hours. Anemia Panel: No results for input(s): VITAMINB12, FOLATE, FERRITIN, TIBC, IRON, RETICCTPCT in the last 72 hours. Urine analysis:    Component Value Date/Time   COLORURINE YELLOW 11/09/2016 1025   APPEARANCEUR CLEAR 11/09/2016 1025   LABSPEC 1.011 11/09/2016 1025   PHURINE 6.0 11/09/2016 1025   GLUCOSEU NEGATIVE 11/09/2016 1025   HGBUR NEGATIVE 11/09/2016 1025     BILIRUBINUR NEGATIVE 11/09/2016 1025   KETONESUR NEGATIVE 11/09/2016 1025   PROTEINUR >=300 (A) 11/09/2016 1025   UROBILINOGEN 0.2 11/22/2014 1810   NITRITE NEGATIVE 11/09/2016 1025   LEUKOCYTESUR NEGATIVE 11/09/2016 1025     )No results found for this or any previous visit (from the past 240 hour(s)).    Anti-infectives    Start     Dose/Rate Route Frequency Ordered Stop   11/09/16 0915  cefTRIAXone (ROCEPHIN) 1 g in dextrose 5 % 50 mL IVPB     1 g 100 mL/hr over 30 Minutes Intravenous  Once 11/09/16 0911 11/09/16 1014   11/09/16 0915  azithromycin (ZITHROMAX) 500 mg in dextrose 5 % 250 mL IVPB     500 mg 250 mL/hr over 60 Minutes Intravenous  Once 11/09/16 0911 11/09/16 1150       Radiology Studies: Dg Chest 2 View  Result Date: 11/10/2016 CLINICAL DATA:  Cough.  Acute on chronic diastolic heart failure. EXAM: CHEST  2 VIEW COMPARISON:  A 11/09/2016 FINDINGS: The cardiac silhouette is mildly enlarged. Aortic atherosclerosis is noted. There is pulmonary vascular congestion with patchy left greater than right perihilar and basilar lung opacities and mild diffuse interstitial opacities, overall not significantly changed. Small pleural effusions remain, again larger on the left than on the right. No pneumothorax is seen. IMPRESSION: Similar appearance of pulmonary edema and pleural effusions. Electronically Signed   By: Sebastian Ache M.D.   On: 11/10/2016 07:55   Dg Chest 2 View  Result Date: 11/09/2016 CLINICAL DATA:  Progressive shortness of breath. EXAM: CHEST  2 VIEW COMPARISON:  One-view chest x-ray 04/05/2016 FINDINGS: The heart size is normal. Mild diffuse edema has increased. Asymmetric left lower lobe airspace disease is present. Bilateral pleural effusions are worse on the left. Degenerative changes are noted in the right shoulder. The visualized soft tissues and bony thorax are otherwise unremarkable. IMPRESSION: 1. Moderate diffuse interstitial edema and bilateral pleural  effusions is concerning for congestive heart failure. 2. Asymmetric left lower lobe airspace disease. While this may reflect atelectasis, left lower lobar pneumonia is also considered. Electronically Signed   By: Marin Roberts M.D.   On: 11/09/2016 08:57        Scheduled Meds: . amLODipine  10 mg Oral Daily  . atorvastatin  10 mg Oral q1800  . calcitRIOL  0.25 mcg Oral Daily  . carvedilol  25 mg Oral BID WC  . cloNIDine  0.2 mg Oral BID  . furosemide  80 mg Intravenous Q12H  . heparin  5,000 Units Subcutaneous Q8H  . hydrALAZINE  50 mg Oral Q6H  . insulin aspart  0-5 Units Subcutaneous QHS  . insulin aspart  0-9 Units Subcutaneous TID WC  . insulin glargine  20 Units Subcutaneous QHS  . potassium chloride SA  20 mEq Oral Daily  . sodium chloride flush  3 mL Intravenous Q12H  . tobramycin  1 drop Left Eye Q6H   Continuous Infusions: . sodium chloride       LOS: 0 days    Time spent: 25 min    Loui Massenburg U Xochitl Egle, DO [facility] Pager [phone]  If 7PM-7AM, please contact night-coverage [email or web address] Password [facility] 11/10/2016, 12:58 PM

## 2016-11-10 NOTE — Evaluation (Signed)
Occupational Therapy Evaluation Patient Details Name: Casey Grimes MRN: [id number] DOB: 01-01-1929 Today's Date: 11/10/2016    History of Present Illness Pt admitted from home with acute on chronic CHF/SOB. PMH: dementia, severe CKD, DM, CVA, depression.   Clinical Impression   Pt walks with a RW and is assisted for LB bathing and dressing and all IADL. He sponge bathes. Pt presents with generalized weakness and impaired standing balance. He is likely at his baseline in cognition. Pt is very eager to go home. He requires min assist for mobility. Will have 24 hour assist of his wife at home. Will follow acutely.   Follow Up Recommendations  No OT follow up    Equipment Recommendations  None recommended by OT    Recommendations for Other Services       Precautions / Restrictions Precautions Precautions: Fall Restrictions Weight Bearing Restrictions: No      Mobility Bed Mobility Overal bed mobility: Needs Assistance Bed Mobility: Supine to Sit     Supine to sit: Min assist     General bed mobility comments: assist for hips to EOB, increased time  Transfers Overall transfer level: Needs assistance Equipment used: Rolling walker (2 wheeled) Transfers: Sit to/from Stand Sit to Stand: Min assist         General transfer comment: assist to rise from bed, cues for hand placement    Balance Overall balance assessment: Needs assistance   Sitting balance-Leahy Scale: Fair       Standing balance-Leahy Scale: Poor                             ADL either performed or assessed with clinical judgement   ADL Overall ADL's : Needs assistance/impaired Eating/Feeding: Independent;Sitting   Grooming: Wash/dry hands;Wash/dry face;Sitting;Set up   Upper Body Bathing: Minimal assistance;Sitting   Lower Body Bathing: Maximal assistance;Sit to/from stand   Upper Body Dressing : Minimal assistance;Sitting   Lower Body Dressing: Maximal assistance;Sit to/from  stand   Toilet Transfer: Minimal assistance;Ambulation;BSC;RW   Toileting- Clothing Manipulation and Hygiene: Minimal assistance;Sit to/from stand       Functional mobility during ADLs: Minimal assistance;Rolling walker       Vision Patient Visual Report: No change from baseline Additional Comments: poor vision, unable to see large print     Perception     Praxis      Pertinent Vitals/Pain       Hand Dominance Right   Extremity/Trunk Assessment Upper Extremity Assessment Upper Extremity Assessment: Overall WFL for tasks assessed   Lower Extremity Assessment Lower Extremity Assessment: Defer to PT evaluation       Communication Communication Communication: HOH   Cognition Arousal/Alertness: Awake/alert Behavior During Therapy: WFL for tasks assessed/performed Overall Cognitive Status: History of cognitive impairments - at baseline                                     General Comments       Exercises     Shoulder Instructions      Home Living Family/patient expects to be discharged to:: Private residence Living Arrangements: Spouse/significant other;Children (daughter) Available Help at Discharge: Family;Available 24 hours/day Type of Home: House Home Access: Stairs to enter [employer] of Steps: 3 Entrance Stairs-Rails: Right;Left;Can reach both Home Layout: One level     Bathroom Shower/Tub: Other (comment) (pt sponge bathes)   Bathroom  Toilet: Standard     Home Equipment: Walker - 2 wheels;Shower seat;Bedside commode   Additional Comments: keeps 3 in 1 over toilet      Prior Functioning/Environment Level of Independence: Needs assistance  Gait / Transfers Assistance Needed: walks with RW ADL's / Homemaking Assistance Needed: sponge bathes with assist for feet/back, assist for LB dressing, self feeds, daughter and wife perform meal prep and housekeeping            OT Problem List: Decreased strength;Impaired  balance (sitting and/or standing);Decreased activity tolerance;Decreased cognition;Decreased knowledge of use of DME or AE      OT Treatment/Interventions: Self-care/ADL training;DME and/or AE instruction;Patient/family education;Therapeutic activities    OT Goals(Current goals can be found in the care plan section) Acute Rehab OT Goals Patient Stated Goal: to go home today OT Goal Formulation: With patient Time For Goal Achievement: 11/17/16 Potential to Achieve Goals: Good ADL Goals Pt Will Perform Grooming: with supervision;standing Pt Will Perform Upper Body Dressing: with supervision;sitting Pt Will Transfer to Toilet: with supervision;ambulating;bedside commode (over toilet) Pt Will Perform Toileting - Clothing Manipulation and hygiene: with supervision;sit to/from stand  OT Frequency: Min 2X/week   Barriers to D/C:            Co-evaluation              End of Session Equipment Utilized During Treatment: Gait belt;Rolling walker Nurse Communication: Mobility status  Activity Tolerance: Patient tolerated treatment well Patient left: in chair;with call bell/phone within reach;with chair alarm set  OT Visit Diagnosis: Unsteadiness on feet (R26.81);Muscle weakness (generalized) (M62.81);Other symptoms and signs involving cognitive function                Time: 7829-5621 OT Time Calculation (min): 26 min Charges:  OT General Charges $OT Visit: 1 Procedure OT Evaluation $OT Eval Moderate Complexity: 1 Procedure OT Treatments $Self Care/Home Management : 8-22 mins G-Codes: OT G-codes **NOT FOR INPATIENT CLASS** Functional Assessment Tool Used: Clinical judgement Functional Limitation: Self care Self Care Current Status (H0865): At least 40 percent but less than 60 percent impaired, limited or restricted Self Care Goal Status (H8469): At least 20 percent but less than 40 percent impaired, limited or restricted    Evern Bio 11/10/2016, 9:02 AM  [phone]

## 2016-11-10 NOTE — Progress Notes (Signed)
Pt. Noted with repeat tremors every 20-30 seconds. Pt. Responded yes when asked if tremors were normal. Tremors are acute change to pts. Previous assessment. On call NP, K. Schorr, for [facility] paged to make aware. Call made to [facility], pts. Spouse to inquire about the tremors. Wife stated that pt. Has had an episode of tremors in the past, 3-4 years ago. Wife also stated that he was treated in the hospital for the tremors and they went away. Pt. VS and in stable condition. No distress noted. Pt. Currently resting in bed. RN will continue to monitor pt. For changes in condition. Mayzie Caughlin, Cheryll Dessert

## 2016-11-10 NOTE — Evaluation (Signed)
Physical Therapy Evaluation Patient Details Name: Casey Grimes MRN: [id number] DOB: 1928/09/07 Today's Date: 11/10/2016   History of Present Illness  Pt admitted from home with acute on chronic CHF/SOB. PMH: dementia, severe CKD, DM, CVA, depression.  Clinical Impression  Pt admitted with above. Suspect pt is functioning near baseline after report from OT who talked with family. Pt does require 24/7 assist for safe d/c home, wife can provide. Pt with noted dementia, decreased insight to deficits, decreased safety awareness, and impaired balance. Acute PT to follow.    Follow Up Recommendations Supervision/Assistance - 24 hour;No PT follow up    Equipment Recommendations  None recommended by PT (has RW)    Recommendations for Other Services       Precautions / Restrictions Precautions Precautions: Fall Restrictions Weight Bearing Restrictions: No      Mobility  Bed Mobility Overal bed mobility: Needs Assistance Bed Mobility: Supine to Sit     Supine to sit: Mod assist     General bed mobility comments: pt initiated transfer OOB however bed rail was up, required modA for trunk elevation  Transfers Overall transfer level: Needs assistance Equipment used: Rolling walker (2 wheeled) Transfers: Sit to/from Stand Sit to Stand: Min assist         General transfer comment: assist to rise from bed, cues for hand placement  Ambulation/Gait Ambulation/Gait assistance: Min assist Ambulation Distance (Feet): 30 Feet Assistive device: Rolling walker (2 wheeled) Gait Pattern/deviations: Step-through pattern;Trunk flexed Gait velocity: slow Gait velocity interpretation: Below normal speed for age/gender General Gait Details: max encouragement to participate, perseverating on laying down in bed  Stairs            Wheelchair Mobility    Modified Rankin (Stroke Patients Only)       Balance Overall balance assessment: Needs assistance Sitting-balance support:  Bilateral upper extremity supported Sitting balance-Leahy Scale: Fair     Standing balance support: Single extremity supported Standing balance-Leahy Scale: Poor Standing balance comment: pt stood and had episode of urinary incontinence                             Pertinent Vitals/Pain      Home Living Family/patient expects to be discharged to:: Private residence Living Arrangements: Spouse/significant other;Children (daughter) Available Help at Discharge: Family;Available 24 hours/day Type of Home: House Home Access: Stairs to enter Entrance Stairs-Rails: Right;Left;Can reach both Entrance Stairs-Number of Steps: 3 Home Layout: One level Home Equipment: Walker - 2 wheels;Shower seat;Bedside commode Additional Comments: keeps 3 in 1 over toilet    Prior Function Level of Independence: Needs assistance   Gait / Transfers Assistance Needed: walks with RW  ADL's / Homemaking Assistance Needed: sponge bathes with assist for feet/back, assist for LB dressing, self feeds, daughter and wife perform meal prep and housekeeping        Hand Dominance   Dominant Hand: Right    Extremity/Trunk Assessment   Upper Extremity Assessment Upper Extremity Assessment: Defer to OT evaluation    Lower Extremity Assessment Lower Extremity Assessment: Generalized weakness    Cervical / Trunk Assessment Cervical / Trunk Assessment: Kyphotic  Communication   Communication: HOH  Cognition Arousal/Alertness: Awake/alert Behavior During Therapy: WFL for tasks assessed/performed Overall Cognitive Status: History of cognitive impairments - at baseline  General Comments: poor insight to deficits and safety      General Comments General comments (skin integrity, edema, etc.): pt perseverating on lying down in bed however was trying to sit up on EOB upon PT arrival    Exercises     Assessment/Plan    PT Assessment Patient needs  continued PT services  PT Problem List Decreased strength;Decreased range of motion;Decreased activity tolerance;Decreased balance;Decreased mobility;Decreased safety awareness;Decreased cognition       PT Treatment Interventions DME instruction;Gait training;Stair training;Functional mobility training;Therapeutic activities;Therapeutic exercise;Balance training    PT Goals (Current goals can be found in the Care Plan section)  Acute Rehab PT Goals Patient Stated Goal: lay down in bed PT Goal Formulation: Patient unable to participate in goal setting Time For Goal Achievement: 11/17/16 Potential to Achieve Goals: Good    Frequency Min 3X/week   Barriers to discharge        Co-evaluation               End of Session Equipment Utilized During Treatment: Gait belt Activity Tolerance: Patient tolerated treatment well Patient left: in bed;with call bell/phone within reach;with bed alarm set;with nursing/sitter in room Nurse Communication: Mobility status PT Visit Diagnosis: Unsteadiness on feet (R26.81);Muscle weakness (generalized) (M62.81)    Time: 4098-1191 PT Time Calculation (min) (ACUTE ONLY): 21 min   Charges:   PT Evaluation $PT Eval Moderate Complexity: 1 Procedure     PT G Codes:   PT G-Codes **NOT FOR INPATIENT CLASS** Functional Assessment Tool Used: Clinical judgement Functional Limitation: Mobility: Walking and moving around Mobility: Walking and Moving Around Current Status (Y7829): At least 40 percent but less than 60 percent impaired, limited or restricted Mobility: Walking and Moving Around Goal Status [phone]): At least 1 percent but less than 20 percent impaired, limited or restricted    Lewis Shock, PT, DPT Pager #: [phone] Office #: [phone]   Rozell Searing Brock Larmon 11/10/2016, 4:51 PM

## 2016-11-10 NOTE — Progress Notes (Signed)
Spoke with Ms. Simi who states patient lives at home with her, is independent with bathing and dressing.  Does use a walker.  OT working with patient at this time.

## 2016-11-11 ENCOUNTER — Other Ambulatory Visit: Payer: Self-pay | Admitting: Cardiovascular Disease

## 2016-11-11 DIAGNOSIS — N189 Chronic kidney disease, unspecified: Secondary | ICD-10-CM | POA: Diagnosis not present

## 2016-11-11 DIAGNOSIS — Z794 Long term (current) use of insulin: Secondary | ICD-10-CM

## 2016-11-11 DIAGNOSIS — E0821 Diabetes mellitus due to underlying condition with diabetic nephropathy: Secondary | ICD-10-CM | POA: Diagnosis not present

## 2016-11-11 DIAGNOSIS — I5033 Acute on chronic diastolic (congestive) heart failure: Secondary | ICD-10-CM | POA: Diagnosis not present

## 2016-11-11 DIAGNOSIS — G309 Alzheimer's disease, unspecified: Secondary | ICD-10-CM | POA: Diagnosis not present

## 2016-11-11 DIAGNOSIS — F0280 Dementia in other diseases classified elsewhere without behavioral disturbance: Secondary | ICD-10-CM

## 2016-11-11 DIAGNOSIS — D631 Anemia in chronic kidney disease: Secondary | ICD-10-CM

## 2016-11-11 LAB — CBC
HCT: 26.2 % — ABNORMAL LOW (ref 39.0–52.0)
Hemoglobin: 8.7 g/dL — ABNORMAL LOW (ref 13.0–17.0)
MCH: 28.3 pg (ref 26.0–34.0)
MCHC: 33.2 g/dL (ref 30.0–36.0)
MCV: 85.3 fL (ref 78.0–100.0)
PLATELETS: 258 10*3/uL (ref 150–400)
RBC: 3.07 MIL/uL — ABNORMAL LOW (ref 4.22–5.81)
RDW: 17.5 % — AB (ref 11.5–15.5)
WBC: 5.7 10*3/uL (ref 4.0–10.5)

## 2016-11-11 LAB — PROCALCITONIN: PROCALCITONIN: 0.43 ng/mL

## 2016-11-11 LAB — BASIC METABOLIC PANEL
Anion gap: 9 (ref 5–15)
BUN: 43 mg/dL — AB (ref 6–20)
CALCIUM: 8.9 mg/dL (ref 8.9–10.3)
CHLORIDE: 107 mmol/L (ref 101–111)
CO2: 23 mmol/L (ref 22–32)
CREATININE: 3.43 mg/dL — AB (ref 0.61–1.24)
GFR calc Af Amer: 17 mL/min — ABNORMAL LOW (ref >60)
GFR calc non Af Amer: 15 mL/min — ABNORMAL LOW (ref >60)
Glucose, Bld: 62 mg/dL — ABNORMAL LOW (ref 65–99)
Potassium: 3.7 mmol/L (ref 3.5–5.1)
SODIUM: 139 mmol/L (ref 135–145)

## 2016-11-11 LAB — GLUCOSE, CAPILLARY
GLUCOSE-CAPILLARY: 63 mg/dL — AB (ref 65–99)
Glucose-Capillary: 142 mg/dL — ABNORMAL HIGH (ref 65–99)

## 2016-11-11 MED ORDER — TORSEMIDE 20 MG PO TABS: 40.0000 mg | ORAL_TABLET | Freq: Two times a day (BID) | ORAL | Status: DC

## 2016-11-11 MED ORDER — INSULIN GLARGINE 100 UNIT/ML ~~LOC~~ SOLN
16.0000 [IU] | Freq: Every day | SUBCUTANEOUS | Status: DC
  Filled 2016-11-11: qty 0.16

## 2016-11-11 MED ORDER — TOBRAMYCIN 0.3 % OP SOLN: 1.0000 [drp] | Freq: Four times a day (QID) | OPHTHALMIC | 0 refills | Status: DC

## 2016-11-11 MED ORDER — INSULIN GLARGINE 100 UNIT/ML ~~LOC~~ SOLN: 16.0000 [IU] | Freq: Every day | SUBCUTANEOUS | 11 refills | Status: DC

## 2016-11-11 MED ORDER — DOXYCYCLINE HYCLATE 50 MG PO CAPS: 50.0000 mg | ORAL_CAPSULE | Freq: Two times a day (BID) | ORAL | 0 refills | Status: DC

## 2016-11-11 NOTE — Progress Notes (Addendum)
Inpatient Diabetes Program Recommendations  AACE/ADA: New Consensus Statement on Inpatient Glycemic Control (2015)  Target Ranges:  Prepandial:   less than 140 mg/dL      Peak postprandial:   less than 180 mg/dL (1-2 hours)      Critically ill patients:  140 - 180 mg/dL   Results for SHELL, YANDOW (MRN [id number]) as of 11/11/2016 09:25  Ref. Range 11/10/2016 07:49 11/10/2016 11:23 11/10/2016 16:26 11/10/2016 21:08 11/11/2016 07:57  Glucose-Capillary Latest Ref Range: 65 - 99 mg/dL 469 (H) 629 (H) 528 (H) 97 63 (L)   Review of Glycemic Control  Diabetes history: DM 2 Outpatient Diabetes medications: Lantus 20, Humalog 5 units tid meal coverage Current orders for Inpatient glycemic control: Lantus 20, Novolog Sensitive + HS scale  Inpatient Diabetes Program Recommendations:   Patient with hypoglycemia 63 mg/dl on home dose of Lantus. Please reduce Lantus by 20% Lantus 16 units.  Thanks,  Christena Deem RN, MSN, [facility] Inpatient Diabetes Coordinator Team Pager [phone] (8a-5p)

## 2016-11-11 NOTE — Discharge Summary (Signed)
Physician Discharge Summary  Casey Grimes ZOX:[id number] DOB: [birth year] DOA: 11/09/2016  PCP: Myrlene Broker, MD  Admit date: 11/09/2016 Discharge date: 11/11/2016   Recommendations for Outpatient Follow-Up:   1. Home with family 2. Strict low Na diet 3. Daily weights- increase torsemide when weight trending up 4. Home health 5. Consider outpatient palliative care referral as patient does not want dialysis   Discharge Diagnosis:   Principal Problem:   Acute on chronic diastolic heart failure (HCC) Active Problems:   Diabetes mellitus with diabetic nephropathy (HCC)   Uncontrolled hypertension   Anemia due to chronic kidney disease   Hyperlipidemia   History of CVA (cerebrovascular accident)   CKD (chronic kidney disease) stage 5, GFR less than 15 ml/min (HCC)   Dementia   Acute conjunctivitis of left eye   Acute urinary retention   Discharge disposition:  Home  Discharge Condition: Improved.  Diet recommendation: Low sodium, heart healthy.  Carbohydrate-modified.  Wound care: None.   History of Present Illness:   Casey Hoult McGirtis a 83 y.o.malewho presents with acute respiratory failure w/ hypoxia secondary to pulmonary edema and questionable infectious process in the setting of CKD 5 (not a dialysis candidate) and CHF exacerbation. Pt recently on decreased dose of torsemide which likely precipitated event   Hospital Course by Problem:   Acute respiratory failure 2/2 Acute on chronic diastolic heart failure  -Patient presents with 3 days worth of shortness of breath and uncontrolled blood pressure with apparent weight gain of about 12 pounds despite taking home medications as prescribed including diuretics; -resume home meds-- will need close follow up as Cr is high and may need increased dose of diuretics -resume metolazone -Resume home carvedilol, hydralazine and Norvasc -No ACE inhibitor secondary to severity of CKD (not a candidate for  ESRD) -Lastechocardiogram 2016: Grade 2 diastolic dysfunction with moderate concentric hypertrophy no valvular dysfunction Echo 2018: Normal LV size with EF 55-60%. Mild LV hypertrophy. Normal RV   size and systolic function. No significant valvular   abnormalities -Does have asymmetric changes in the left lower lobe that could be reflective of pneumonia;has a nonproductive cough, no leukocytosis or fever -procalcitonin slightly elevated so will treat for 5 days with abx  Uncontrolled hypertension -Secondary to volume overload -Resume home medications as above- close outpatient follow up  Acute urinary retention -resolved  CKD (chronic kidney disease) stage 5, GFR less than 15 ml/min  -Followed by Dr. Lowell Guitar as an outpatient -Previously documented that patient does not wish to undergo hemodialysis  Diabetes mellitus with diabetic nephropathy  -lower dose of lantus, depending on diet at home, may need to increase back -home health to help  Dementia -lives with wife  Acute conjunctivitis of left eye -Begin tobramycin eyedrops x7 days total -if not better, may need   Anemia due to chronic kidney disease -Baseline hemoglobin September 2017 was 11.7  Hyperlipidemia -Continue statin  History of CVA (cerebrovascular accident) -Continue aspirin and statin     Medical Consultants:    None.   Discharge Exam:   Vitals:   11/11/16 0930 11/11/16 1218  BP: (!) 166/58 (!) 166/55  Pulse: 62 61  Resp:  18  Temp:  98.7 F (37.1 C)   Vitals:   11/11/16 0137 11/11/16 0644 11/11/16 0930 11/11/16 1218  BP: (!) 171/62 (!) 171/66 (!) 166/58 (!) 166/55  Pulse: 66 69 62 61  Resp:  18  18  Temp:  98.2 F (36.8 C)  98.7 F (37.1 C)  TempSrc:  Oral  Oral  SpO2:  100%  98%  Weight:  95.3 kg (210 lb)    Height:        Gen:  NAD- patient not eating here as he misses his wife    The results of significant diagnostics from this hospitalization  (including imaging, microbiology, ancillary and laboratory) are listed below for reference.     Procedures and Diagnostic Studies:   Dg Chest 2 View  Result Date: 11/10/2016 CLINICAL DATA:  Cough.  Acute on chronic diastolic heart failure. EXAM: CHEST  2 VIEW COMPARISON:  A 11/09/2016 FINDINGS: The cardiac silhouette is mildly enlarged. Aortic atherosclerosis is noted. There is pulmonary vascular congestion with patchy left greater than right perihilar and basilar lung opacities and mild diffuse interstitial opacities, overall not significantly changed. Small pleural effusions remain, again larger on the left than on the right. No pneumothorax is seen. IMPRESSION: Similar appearance of pulmonary edema and pleural effusions. Electronically Signed   By: Sebastian Ache M.D.   On: 11/10/2016 07:55   Dg Chest 2 View  Result Date: 11/09/2016 CLINICAL DATA:  Progressive shortness of breath. EXAM: CHEST  2 VIEW COMPARISON:  One-view chest x-ray 04/05/2016 FINDINGS: The heart size is normal. Mild diffuse edema has increased. Asymmetric left lower lobe airspace disease is present. Bilateral pleural effusions are worse on the left. Degenerative changes are noted in the right shoulder. The visualized soft tissues and bony thorax are otherwise unremarkable. IMPRESSION: 1. Moderate diffuse interstitial edema and bilateral pleural effusions is concerning for congestive heart failure. 2. Asymmetric left lower lobe airspace disease. While this may reflect atelectasis, left lower lobar pneumonia is also considered. Electronically Signed   By: Marin Roberts M.D.   On: 11/09/2016 08:57     Labs:   Basic Metabolic Panel:  Recent Labs Lab 11/09/16 0818 11/10/16 0620 11/11/16 0425  NA 142 139 139  K 4.3 3.8 3.7  CL 110 108 107  CO2 22 22 23   GLUCOSE 177* 134* 62*  BUN 42* 40* 43*  CREATININE 3.39* 3.22* 3.43*  CALCIUM 9.3 8.9 8.9   GFR Estimated Creatinine Clearance: 18.2 mL/min (A) (by C-G formula  based on SCr of 3.43 mg/dL (H)). Liver Function Tests:  Recent Labs Lab 11/09/16 0818  AST 26  ALT 17  ALKPHOS 54  BILITOT 0.6  PROT 7.7  ALBUMIN 3.3*   No results for input(s): LIPASE, AMYLASE in the last 168 hours. No results for input(s): AMMONIA in the last 168 hours. Coagulation profile No results for input(s): INR, PROTIME in the last 168 hours.  CBC:  Recent Labs Lab 11/09/16 0818 11/11/16 0425  WBC 7.2 5.7  NEUTROABS 5.0  --   HGB 10.4* 8.7*  HCT 31.9* 26.2*  MCV 85.1 85.3  PLT 338 258   Cardiac Enzymes: No results for input(s): CKTOTAL, CKMB, CKMBINDEX, TROPONINI in the last 168 hours. BNP: Invalid input(s): POCBNP CBG:  Recent Labs Lab 11/10/16 1123 11/10/16 1626 11/10/16 2108 11/11/16 0757 11/11/16 1218  GLUCAP 197* 263* 97 63* 142*   D-Dimer No results for input(s): DDIMER in the last 72 hours. Hgb A1c  Recent Labs  11/09/16 1049  HGBA1C 6.7*   Lipid Profile No results for input(s): CHOL, HDL, LDLCALC, TRIG, CHOLHDL, LDLDIRECT in the last 72 hours. Thyroid function studies No results for input(s): TSH, T4TOTAL, T3FREE, THYROIDAB in the last 72 hours.  Invalid input(s): FREET3 Anemia work up No results for input(s): VITAMINB12, FOLATE, FERRITIN, TIBC, IRON, RETICCTPCT in the  last 72 hours. Microbiology No results found for this or any previous visit (from the past 240 hour(s)).   Discharge Instructions:   Discharge Instructions    (HEART FAILURE PATIENTS) Call MD:  Anytime you have any of the following symptoms: 1) 3 pound weight gain in 24 hours or 5 pounds in 1 week 2) shortness of breath, with or without a dry hacking cough 3) swelling in the hands, feet or stomach 4) if you have to sleep on extra pillows at night in order to breathe.    Complete by:  As directed    Diet - low sodium heart healthy    Complete by:  As directed    Diet Carb Modified    Complete by:  As directed    Discharge instructions    Complete by:  As  directed    If eating better, may need to increase lantus up Eye drops for 5 days, if not improving would see an opthamologist   Increase activity slowly    Complete by:  As directed      Allergies as of 11/11/2016   No Known Allergies     Medication List    STOP taking these medications   ondansetron 4 MG tablet Commonly known as:  ZOFRAN     TAKE these medications   allopurinol 300 MG tablet Commonly known as:  ZYLOPRIM Take 150 mg by mouth daily.   amLODipine 10 MG tablet Commonly known as:  NORVASC TAKE 1 TABLET BY MOUTH EVERY DAY   aspirin EC 325 MG tablet Take 325 mg by mouth daily.   atorvastatin 10 MG tablet Commonly known as:  LIPITOR TAKE 1 TABLET (10 MG TOTAL) BY MOUTH DAILY.   calcitRIOL 0.25 MCG capsule Commonly known as:  ROCALTROL TAKE 1 CAPSULE (0.25 MCG TOTAL) BY MOUTH DAILY.   carvedilol 25 MG tablet Commonly known as:  COREG TAKE 1 TABLET BY MOUTH TWICE A DAY What changed:  See the new instructions.   cloNIDine 0.2 MG tablet Commonly known as:  CATAPRES TAKE 1 TABLET TWICE A DAY   doxycycline 50 MG capsule Commonly known as:  VIBRAMYCIN Take 1 capsule (50 mg total) by mouth 2 (two) times daily.   HUMALOG 100 UNIT/ML injection Generic drug:  insulin lispro Inject 0.05 mLs (5 Units total) into the skin 3 (three) times daily with meals.   hydrALAZINE 50 MG tablet Commonly known as:  APRESOLINE TAKE 1 TABLET (50 MG TOTAL) BY MOUTH 4 (FOUR) TIMES DAILY.   insulin glargine 100 UNIT/ML injection Commonly known as:  LANTUS Inject 0.16 mLs (16 Units total) into the skin at bedtime. What changed:  See the new instructions.   Insulin Syringe-Needle U-100 30G X 1/2" 0.5 ML Misc Commonly known as:  B-D INS SYRINGE 0.5CC/30GX1/2" USE 3 PER DAY AS DIRECTED   B-D INS SYRINGE 0.5CC/30GX1/2" 30G X 1/2" 0.5 ML Misc Generic drug:  Insulin Syringe-Needle U-100 USE 3 PER DAY AS DIRECTED   KLOR-CON M20 20 MEQ tablet Generic drug:  potassium  chloride SA TAKE 1 TABLET (20 MEQ TOTAL) BY MOUTH DAILY.   metolazone 2.5 MG tablet Commonly known as:  ZAROXOLYN Take 2.5 mg by mouth 4 (four) times a week. M, W, Fr, Sa   ONE TOUCH ULTRA SYSTEM KIT w/Device Kit 1 kit by Does not apply route once.   ONE TOUCH ULTRA TEST test strip Generic drug:  glucose blood USE TO CHECK BLOOD SUGAR 4 TIMES DAILY. E11.21   tobramycin 0.3 %  ophthalmic solution Commonly known as:  TOBREX Place 1 drop into the left eye every 6 (six) hours.   torsemide 20 MG tablet Commonly known as:  DEMADEX TAKE 2 TABLETS (40 MG TOTAL) BY MOUTH 2 (TWO) TIMES DAILY.         Time coordinating discharge: 25 min  Signed:  Amir Glaus U Synda Bagent   [facility] 11/11/2016, 12:59 PM

## 2016-11-11 NOTE — Progress Notes (Signed)
Physical Therapy Treatment Patient Details Name: Casey Grimes MRN: [id number] DOB: 03-28-1929 Today's Date: 11/11/2016    History of Present Illness Pt admitted from home with acute on chronic CHF/SOB. PMH: dementia, severe CKD, DM, CVA, depression.    PT Comments    Patient required assistance for safe mobility. Patient needs to practice stairs next session.   Pt incontinent of urine upon standing. Continue to progress as tolerated with anticipated d/c home with HHPT and 24 hour assist from family.   Follow Up Recommendations  Supervision/Assistance - 24 hour;No PT follow up     Equipment Recommendations  None recommended by PT (has RW)    Recommendations for Other Services       Precautions / Restrictions Precautions Precautions: Fall Restrictions Weight Bearing Restrictions: No    Mobility  Bed Mobility Overal bed mobility: Needs Assistance Bed Mobility: Supine to Sit     Supine to sit: Mod assist     General bed mobility comments: assist to elevate trunk into sitting; cues for technique; pt reached out for assistance to come into sitting  Transfers Overall transfer level: Needs assistance Equipment used: Rolling walker (2 wheeled) Transfers: Sit to/from Stand Sit to Stand: Min assist;Mod assist;From elevated surface         General transfer comment: mod A from EOB and min A from chair to power up into sitting; cues for hand placement and scooting forward  Ambulation/Gait Ambulation/Gait assistance: Min assist Ambulation Distance (Feet): 60 Feet Assistive device: Rolling walker (2 wheeled) Gait Pattern/deviations: Step-through pattern;Trunk flexed Gait velocity: slow   General Gait Details: cues for safety and posture/forward gaze   Stairs            Wheelchair Mobility    Modified Rankin (Stroke Patients Only)       Balance Overall balance assessment: Needs assistance Sitting-balance support: Bilateral upper extremity supported Sitting  balance-Leahy Scale: Fair     Standing balance support: Single extremity supported Standing balance-Leahy Scale: Poor Standing balance comment: pt stood and had episode of urinary incontinence                            Cognition Arousal/Alertness: Awake/alert Behavior During Therapy: WFL for tasks assessed/performed Overall Cognitive Status: History of cognitive impairments - at baseline                                 General Comments: poor insight to deficits and safety      Exercises      General Comments        Pertinent Vitals/Pain Pain Assessment: Faces Faces Pain Scale: Hurts little more Pain Location: R LE (knee and calf) with ambulation Pain Descriptors / Indicators: Aching;Guarding Pain Intervention(s): Limited activity within patient's tolerance;Monitored during session;Repositioned    Home Living                      Prior Function            PT Goals (current goals can now be found in the care plan section) Acute Rehab PT Goals PT Goal Formulation: Patient unable to participate in goal setting Time For Goal Achievement: 11/17/16 Potential to Achieve Goals: Good Progress towards PT goals: Progressing toward goals    Frequency    Min 3X/week      PT Plan Current plan remains appropriate    Co-evaluation  End of Session Equipment Utilized During Treatment: Gait belt Activity Tolerance: Patient tolerated treatment well Patient left: with call bell/phone within reach;in chair;with chair alarm set Nurse Communication: Mobility status PT Visit Diagnosis: Unsteadiness on feet (R26.81);Muscle weakness (generalized) (M62.81)     Time: 8119-1478 PT Time Calculation (min) (ACUTE ONLY): 31 min  Charges:  $Gait Training: 8-22 mins $Therapeutic Activity: 8-22 mins                    G Codes:       Erline Levine, PTA Pager: [phone]     Carolynne Edouard 11/11/2016, 3:22 PM

## 2016-11-11 NOTE — Care Management Note (Signed)
Case Management Note  Patient Details  Name: Casey Grimes MRN: [id number] Date of Birth: [birth year]  Subjective/Objective:  Admitted with CHF                 Action/Plan: Patient lives at home with his spouse and his son; PCP: Myrlene Broker, MD; private insurance with [facility] with prescription drug coverage; pharmacy of choice [facility]; pt has a walker; need wheelchair ( to be delivered to his home at discharge) [facility] choice offered, spouse chose [facility]); Mary with [facility] called for arrangements.  Patient will have ambulance transportation home/ PTAR; address verified by wife;   Expected Discharge Date:  11/11/16               Expected Discharge Plan:  Home w Home Health Services  Discharge planning Services  CM Consult  Choice offered to:  Spouse, Adult Children    HH Arranged:  RN, Disease Management, PT, OT, Nurse's Aide HH Agency:  [facility] (now [facility])  Status of Service:  In process, will continue to follow  Reola Mosher [phone] 11/11/2016, 2:14 PM

## 2016-11-11 NOTE — Progress Notes (Signed)
Patient still having on and off jerky/tremors. Alert and oriented to self. Discharge to home, ambulance to transport patient. PIV removed no s/s of infiltration or swelling noted.

## 2016-11-12 ENCOUNTER — Telehealth: Payer: Self-pay | Admitting: Internal Medicine

## 2016-11-12 NOTE — Telephone Encounter (Signed)
VERBAL ORDERS: NURSING - Medication/Diet management 1X A WEEK 1 2X A WEEK  8  [phone] - Marquita Palms

## 2016-11-12 NOTE — Telephone Encounter (Signed)
Rinaldo Cloud called again to add to it..  Home health nursing aide  2x week 8  social worker 1x week 1

## 2016-11-15 ENCOUNTER — Encounter: Payer: Self-pay | Admitting: Internal Medicine

## 2016-11-15 ENCOUNTER — Ambulatory Visit (INDEPENDENT_AMBULATORY_CARE_PROVIDER_SITE_OTHER): Payer: Medicare Other | Admitting: Internal Medicine

## 2016-11-15 VITALS — BP 130/54 | HR 55 | Temp 98.1°F | Resp 12 | Ht 71.0 in | Wt 204.0 lb

## 2016-11-15 DIAGNOSIS — R531 Weakness: Secondary | ICD-10-CM

## 2016-11-15 DIAGNOSIS — I5032 Chronic diastolic (congestive) heart failure: Secondary | ICD-10-CM | POA: Diagnosis not present

## 2016-11-15 DIAGNOSIS — N185 Chronic kidney disease, stage 5: Secondary | ICD-10-CM

## 2016-11-15 DIAGNOSIS — E118 Type 2 diabetes mellitus with unspecified complications: Secondary | ICD-10-CM

## 2016-11-15 DIAGNOSIS — F0390 Unspecified dementia without behavioral disturbance: Secondary | ICD-10-CM

## 2016-11-15 NOTE — Patient Instructions (Signed)
We do not need blood work today.    We will get the palliative care resources coming out to the house and will call for the orders for home health as well to help you stay at home.   If you have any problems or questions please feel free to call us.

## 2016-11-15 NOTE — Telephone Encounter (Signed)
Casey Grimes has been informed of the OKAY. Thank you.

## 2016-11-15 NOTE — Telephone Encounter (Signed)
Rinaldo Cloud called again and wants verbals for  Speech therapy 1x1 for eval and treat Skilled nursing  1x1 and 2x8 home health aid 2x8 Social worker 1x1 will call in plan of care

## 2016-11-15 NOTE — Telephone Encounter (Addendum)
Okay to give. 

## 2016-11-15 NOTE — Progress Notes (Signed)
   Subjective:    Patient ID: Casey Grimes, male    DOB: 09-12-1928, 83 y.o.   MRN: [id number]  HPI The patient is an 83 YO man coming in for hospital follow up (in for diastolic heart failure with decreased dosing of torsemide and metolazone and 12 pound weight gain). Doing okay since being home. No falls. Weight is stable. Denies chest pains or SOB. No swelling in legs or ankles. Sugars have been better with dosage adjustment and no readings in the morning <100. Eating well and sleeping okay. Still napping a lot during the day. Mental status is stable from prior. No fevers or chills. Cough is reduced. Finished the antibiotics from the hospital.   PMH, [facility], social history reviewed and updated.   Review of Systems  Unable to perform ROS: Dementia  Constitutional: Positive for fatigue. Negative for activity change, appetite change, fever and unexpected weight change.  Respiratory: Negative.   Cardiovascular: Negative.   Gastrointestinal: Negative.   Musculoskeletal: Negative.   Skin: Negative.   Neurological: Positive for weakness.  Psychiatric/Behavioral: Negative for agitation, decreased concentration and sleep disturbance.      Objective:   Physical Exam  Constitutional: He is oriented to person, place, and time. He appears well-developed and well-nourished.  HENT:  Head: Normocephalic and atraumatic.  Eyes: EOM are normal.  Neck: Normal range of motion.  Cardiovascular: Normal rate and regular rhythm.   Pulmonary/Chest: Effort normal and breath sounds normal. No respiratory distress. He has no wheezes. He has no rales.  Abdominal: Soft. Bowel sounds are normal. He exhibits no distension. There is no tenderness. There is no rebound.  Musculoskeletal: He exhibits no edema.  Neurological: He is alert and oriented to person, place, and time. Coordination normal.  Skin: Skin is warm and dry.   Vitals:   11/15/16 1359  BP: (!) 130/54  Pulse: (!) 55  Resp: 12  Temp: 98.1 F (36.7  C)  TempSrc: Oral  SpO2: 100%  Weight: 204 lb (92.5 kg)  Height:  (1.803 m)      Assessment & Plan:

## 2016-11-15 NOTE — Telephone Encounter (Signed)
Just discharged from hospital so likely they have given these orders for home health.

## 2016-11-15 NOTE — Progress Notes (Signed)
Pre visit review using our clinic review tool, if applicable. No additional management support is needed unless otherwise documented below in the visit note. 

## 2016-11-16 ENCOUNTER — Encounter: Payer: Self-pay | Admitting: Internal Medicine

## 2016-11-16 NOTE — Assessment & Plan Note (Signed)
Taking 16 units lantus and humalog if needed with meals. He is eating well and no low sugars at home. Would rather have slightly high sugars than lows as those are dangerous with his mental status.

## 2016-11-16 NOTE — Assessment & Plan Note (Signed)
Exacerbation is gone now and he is taking the torsemide and metolazone as per directed. Weight is stable and breathing is improved.

## 2016-11-16 NOTE — Assessment & Plan Note (Signed)
Kidney function slightly improved with diuresis in the hospital. He has declined dialysis which is appropriate with his severe dementia. We will continue to monitor and work with controlling BP at sugars which are currently well controlled.

## 2016-11-16 NOTE — Assessment & Plan Note (Addendum)
Stable at this time and no changes since being home. They do agree with palliative care services and those are ordered at the visit.

## 2016-11-16 NOTE — Assessment & Plan Note (Signed)
Stable at this time and working with PT at home.

## 2016-11-17 ENCOUNTER — Encounter: Payer: Self-pay | Admitting: Cardiovascular Disease

## 2016-11-17 ENCOUNTER — Ambulatory Visit (INDEPENDENT_AMBULATORY_CARE_PROVIDER_SITE_OTHER): Payer: Medicare Other | Admitting: Cardiovascular Disease

## 2016-11-17 VITALS — BP 150/50 | HR 60 | Ht 71.0 in | Wt 199.0 lb

## 2016-11-17 DIAGNOSIS — I5032 Chronic diastolic (congestive) heart failure: Secondary | ICD-10-CM | POA: Diagnosis not present
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
## 2016-11-17 NOTE — Patient Instructions (Signed)
Medication Instructions:  Your physician recommends that you continue on your current medications as directed. Please refer to the Current Medication list given to you today.   Labwork: Your physician recommends that you return for lab work at next office visit for complete metabolic panel. You do not have to fast for this appointment.    Testing/Procedures: None Ordered   Follow-Up: Your physician recommends that you schedule a follow-up appointment in: 3-4 months with Dr. Elease Hashimoto   If you need a refill on your cardiac medications before your next appointment, please call your pharmacy.   Thank you for choosing [facility]! Eligha Bridegroom, RN [phone]

## 2016-11-18 ENCOUNTER — Telehealth: Payer: Self-pay | Admitting: Internal Medicine

## 2016-11-18 NOTE — Telephone Encounter (Signed)
[facility]  [phone]  Pt wife called them and said that his bp was 192/119 And his heart rate is 126 .  She said she rechecked it an hour later after meds and it was 139/67 Heart rate was 61  Just FYI

## 2016-11-18 NOTE — Telephone Encounter (Signed)
Noted thanks °

## 2016-11-22 ENCOUNTER — Telehealth: Payer: Self-pay | Admitting: Internal Medicine

## 2016-11-22 ENCOUNTER — Other Ambulatory Visit: Payer: Self-pay | Admitting: Internal Medicine

## 2016-11-22 NOTE — Telephone Encounter (Signed)
Needs verbals for continuation of physical therapy  For 2x3

## 2016-11-23 ENCOUNTER — Telehealth: Payer: Self-pay | Admitting: Internal Medicine

## 2016-11-23 NOTE — Telephone Encounter (Signed)
Tried call 3 times to give verbal ok, got a busy signal every time. Will try again later

## 2016-11-23 NOTE — Telephone Encounter (Signed)
Requesting verbal orders for OT for twice a week for 4 weeks.

## 2016-11-23 NOTE — Telephone Encounter (Signed)
Fine

## 2016-11-23 NOTE — Telephone Encounter (Signed)
Tried calling again and keep getting busy signal

## 2016-11-24 ENCOUNTER — Telehealth: Payer: Self-pay | Admitting: Internal Medicine

## 2016-11-24 ENCOUNTER — Other Ambulatory Visit: Payer: Self-pay | Admitting: Internal Medicine

## 2016-11-24 NOTE — Telephone Encounter (Signed)
Requesting verbal orders for Speech therapy one week one and two week four.

## 2016-11-24 NOTE — Telephone Encounter (Signed)
Fine

## 2016-11-24 NOTE — Telephone Encounter (Signed)
LVM with Junious Dresseronnie with verbal ok

## 2016-11-24 NOTE — Telephone Encounter (Signed)
Erin called and aware

## 2016-11-26 ENCOUNTER — Telehealth: Payer: Self-pay

## 2016-11-26 NOTE — Telephone Encounter (Signed)
Palliative care contacted

## 2016-11-29 NOTE — Telephone Encounter (Signed)
Ok given

## 2016-12-02 ENCOUNTER — Telehealth: Payer: Self-pay | Admitting: Internal Medicine

## 2016-12-02 NOTE — Telephone Encounter (Signed)
Called stating pt weight yesterday was 191.8 and today it 194.4.

## 2016-12-02 NOTE — Telephone Encounter (Signed)
Contacted and [facility] stated they will call us if it continues to increase

## 2016-12-02 NOTE — Telephone Encounter (Signed)
Noted, if continued increase to call back

## 2016-12-05 ENCOUNTER — Other Ambulatory Visit: Payer: Self-pay | Admitting: Internal Medicine

## 2016-12-06 NOTE — Telephone Encounter (Signed)
Note on request states to stop taking at discharge, please advise

## 2016-12-21 ENCOUNTER — Telehealth: Payer: Self-pay | Admitting: Internal Medicine

## 2016-12-21 NOTE — Telephone Encounter (Signed)
Requesting script for a 3 in 1 commode for patient.   Fax to [facility] at [phone].

## 2016-12-21 NOTE — Telephone Encounter (Signed)
Can we enter this in to the system and ill send a message to the woman who help with [facility] orders

## 2016-12-22 NOTE — Telephone Encounter (Signed)
Fine with me, if you can find and pend I will sign

## 2016-12-24 NOTE — Telephone Encounter (Signed)
Would like an order put in for a Tub transfer bench to help with showers. [facility]  [phone]

## 2017-01-01 ENCOUNTER — Other Ambulatory Visit: Payer: Self-pay | Admitting: Internal Medicine

## 2017-01-20 ENCOUNTER — Other Ambulatory Visit: Payer: Self-pay | Admitting: Internal Medicine

## 2017-01-24 ENCOUNTER — Other Ambulatory Visit: Payer: Self-pay | Admitting: Internal Medicine

## 2017-02-05 ENCOUNTER — Other Ambulatory Visit: Payer: Self-pay | Admitting: Internal Medicine

## 2017-02-16 ENCOUNTER — Other Ambulatory Visit: Payer: Self-pay | Admitting: Internal Medicine

## 2017-02-18 ENCOUNTER — Telehealth: Payer: Self-pay | Admitting: Internal Medicine

## 2017-02-21 NOTE — Telephone Encounter (Signed)
Routing to dr plotnikov---I dont think i'm seeing office visit in last 18 months to address the need for this med---are you ok with refilling, please advise, thanks

## 2017-03-02 ENCOUNTER — Other Ambulatory Visit: Payer: Self-pay | Admitting: Internal Medicine

## 2017-03-04 ENCOUNTER — Other Ambulatory Visit: Payer: Self-pay | Admitting: Internal Medicine

## 2017-03-04 NOTE — Telephone Encounter (Signed)
Pt wife called regarding this  Please advise  Last saw Okey Dupre for [place] [facility] on 11/15/2016

## 2017-03-04 NOTE — Telephone Encounter (Signed)
Reviewed chart pt is due for f/u appt sent 30 day supply until appt w/MD.../lm,b

## 2017-03-04 NOTE — Telephone Encounter (Signed)
Pt wife called regarding this  Last saw crawford for hosp fu on 11/15/2016 Please advise

## 2017-03-05 NOTE — Telephone Encounter (Signed)
OK to fill this/these prescription(s)x3 months. Sch OV  Thank you!

## 2017-03-07 NOTE — Telephone Encounter (Signed)
rx filled on 02/21/17

## 2017-03-10 ENCOUNTER — Encounter (HOSPITAL_COMMUNITY): Payer: Self-pay | Admitting: Emergency Medicine

## 2017-03-10 ENCOUNTER — Ambulatory Visit (INDEPENDENT_AMBULATORY_CARE_PROVIDER_SITE_OTHER): Payer: Medicare Other | Admitting: Nurse Practitioner

## 2017-03-10 ENCOUNTER — Inpatient Hospital Stay (HOSPITAL_COMMUNITY)
Admission: EM | Admit: 2017-03-10 | Discharge: 2017-03-14 | DRG: 683 | Disposition: A | Discharge status: Skilled Nursing Facility | Payer: Medicare Other | Attending: Family Medicine | Admitting: Family Medicine

## 2017-03-10 ENCOUNTER — Other Ambulatory Visit (INDEPENDENT_AMBULATORY_CARE_PROVIDER_SITE_OTHER): Payer: Medicare Other

## 2017-03-10 ENCOUNTER — Encounter: Payer: Self-pay | Admitting: Nurse Practitioner

## 2017-03-10 ENCOUNTER — Emergency Department (HOSPITAL_COMMUNITY): Payer: Medicare Other

## 2017-03-10 VITALS — BP 132/50 | HR 56 | Temp 97.6°F | Ht 71.0 in | Wt 174.0 lb

## 2017-03-10 DIAGNOSIS — N185 Chronic kidney disease, stage 5: Secondary | ICD-10-CM | POA: Diagnosis present

## 2017-03-10 DIAGNOSIS — Z8673 Personal history of transient ischemic attack (TIA), and cerebral infarction without residual deficits: Secondary | ICD-10-CM

## 2017-03-10 DIAGNOSIS — E871 Hypo-osmolality and hyponatremia: Secondary | ICD-10-CM | POA: Diagnosis not present

## 2017-03-10 DIAGNOSIS — E1140 Type 2 diabetes mellitus with diabetic neuropathy, unspecified: Secondary | ICD-10-CM

## 2017-03-10 DIAGNOSIS — K5900 Constipation, unspecified: Secondary | ICD-10-CM | POA: Diagnosis not present

## 2017-03-10 DIAGNOSIS — M6281 Muscle weakness (generalized): Secondary | ICD-10-CM

## 2017-03-10 DIAGNOSIS — E0821 Diabetes mellitus due to underlying condition with diabetic nephropathy: Secondary | ICD-10-CM

## 2017-03-10 DIAGNOSIS — E860 Dehydration: Secondary | ICD-10-CM | POA: Diagnosis present

## 2017-03-10 DIAGNOSIS — N184 Chronic kidney disease, stage 4 (severe): Secondary | ICD-10-CM

## 2017-03-10 DIAGNOSIS — Z87891 Personal history of nicotine dependence: Secondary | ICD-10-CM

## 2017-03-10 DIAGNOSIS — I10 Essential (primary) hypertension: Secondary | ICD-10-CM | POA: Diagnosis present

## 2017-03-10 DIAGNOSIS — N19 Unspecified kidney failure: Secondary | ICD-10-CM

## 2017-03-10 DIAGNOSIS — E785 Hyperlipidemia, unspecified: Secondary | ICD-10-CM | POA: Diagnosis present

## 2017-03-10 DIAGNOSIS — E861 Hypovolemia: Secondary | ICD-10-CM | POA: Diagnosis present

## 2017-03-10 DIAGNOSIS — N179 Acute kidney failure, unspecified: Secondary | ICD-10-CM | POA: Diagnosis not present

## 2017-03-10 DIAGNOSIS — R531 Weakness: Secondary | ICD-10-CM | POA: Diagnosis not present

## 2017-03-10 DIAGNOSIS — Z794 Long term (current) use of insulin: Secondary | ICD-10-CM

## 2017-03-10 DIAGNOSIS — Z79899 Other long term (current) drug therapy: Secondary | ICD-10-CM

## 2017-03-10 DIAGNOSIS — I132 Hypertensive heart and chronic kidney disease with heart failure and with stage 5 chronic kidney disease, or end stage renal disease: Secondary | ICD-10-CM | POA: Diagnosis present

## 2017-03-10 DIAGNOSIS — N2581 Secondary hyperparathyroidism of renal origin: Secondary | ICD-10-CM | POA: Diagnosis present

## 2017-03-10 DIAGNOSIS — E876 Hypokalemia: Secondary | ICD-10-CM | POA: Diagnosis not present

## 2017-03-10 DIAGNOSIS — N189 Chronic kidney disease, unspecified: Secondary | ICD-10-CM

## 2017-03-10 DIAGNOSIS — M109 Gout, unspecified: Secondary | ICD-10-CM | POA: Diagnosis present

## 2017-03-10 DIAGNOSIS — D631 Anemia in chronic kidney disease: Secondary | ICD-10-CM | POA: Diagnosis not present

## 2017-03-10 DIAGNOSIS — E1122 Type 2 diabetes mellitus with diabetic chronic kidney disease: Secondary | ICD-10-CM | POA: Diagnosis present

## 2017-03-10 DIAGNOSIS — E1121 Type 2 diabetes mellitus with diabetic nephropathy: Secondary | ICD-10-CM | POA: Diagnosis present

## 2017-03-10 DIAGNOSIS — F0390 Unspecified dementia without behavioral disturbance: Secondary | ICD-10-CM | POA: Diagnosis present

## 2017-03-10 DIAGNOSIS — Z7982 Long term (current) use of aspirin: Secondary | ICD-10-CM

## 2017-03-10 DIAGNOSIS — E1142 Type 2 diabetes mellitus with diabetic polyneuropathy: Secondary | ICD-10-CM | POA: Diagnosis present

## 2017-03-10 DIAGNOSIS — I5032 Chronic diastolic (congestive) heart failure: Secondary | ICD-10-CM | POA: Diagnosis present

## 2017-03-10 LAB — COMPREHENSIVE METABOLIC PANEL
ALBUMIN: 3.6 g/dL (ref 3.5–5.0)
ALK PHOS: 54 U/L (ref 38–126)
ALT: 12 U/L — AB (ref 17–63)
ANION GAP: 13 (ref 5–15)
AST: 17 U/L (ref 15–41)
BUN: 146 mg/dL — ABNORMAL HIGH (ref 6–20)
CALCIUM: 10 mg/dL (ref 8.9–10.3)
CHLORIDE: 93 mmol/L — AB (ref 101–111)
CO2: 26 mmol/L (ref 22–32)
Creatinine, Ser: 5.34 mg/dL — ABNORMAL HIGH (ref 0.61–1.24)
GFR calc non Af Amer: 9 mL/min — ABNORMAL LOW (ref >60)
GFR, EST AFRICAN AMERICAN: 10 mL/min — AB (ref >60)
GLUCOSE: 246 mg/dL — AB (ref 65–99)
Potassium: 3 mmol/L — ABNORMAL LOW (ref 3.5–5.1)
Sodium: 132 mmol/L — ABNORMAL LOW (ref 135–145)
Total Bilirubin: 0.5 mg/dL (ref 0.3–1.2)
Total Protein: 8.2 g/dL — ABNORMAL HIGH (ref 6.5–8.1)

## 2017-03-10 LAB — BASIC METABOLIC PANEL
BUN: 144 mg/dL — AB (ref 6–23)
CHLORIDE: 91 meq/L — AB (ref 96–112)
CO2: 27 mEq/L (ref 19–32)
CREATININE: 5.39 mg/dL — AB (ref 0.40–1.50)
Calcium: 10.7 mg/dL — ABNORMAL HIGH (ref 8.4–10.5)
GFR: 13.02 mL/min — CL (ref >60.00)
GLUCOSE: 141 mg/dL — AB (ref 70–99)
Potassium: 2.9 mEq/L — ABNORMAL LOW (ref 3.5–5.1)
Sodium: 133 mEq/L — ABNORMAL LOW (ref 135–145)

## 2017-03-10 LAB — CBC
HEMATOCRIT: 36 % — AB (ref 39.0–52.0)
HEMOGLOBIN: 12.2 g/dL — AB (ref 13.0–17.0)
MCH: 27.7 pg (ref 26.0–34.0)
MCHC: 33.9 g/dL (ref 30.0–36.0)
MCV: 81.8 fL (ref 78.0–100.0)
Platelets: 291 10*3/uL (ref 150–400)
RBC: 4.4 MIL/uL (ref 4.22–5.81)
RDW: 15.5 % (ref 11.5–15.5)
WBC: 5.2 10*3/uL (ref 4.0–10.5)

## 2017-03-10 LAB — CBC WITH DIFFERENTIAL/PLATELET
BASOS ABS: 0 10*3/uL (ref 0.0–0.1)
BASOS ABS: 0 10*3/uL (ref 0.0–0.1)
BASOS PCT: 0 %
Basophils Relative: 0.5 % (ref 0.0–3.0)
EOS ABS: 0.3 10*3/uL (ref 0.0–0.7)
EOS PCT: 4 %
Eosinophils Absolute: 0.3 10*3/uL (ref 0.0–0.7)
Eosinophils Relative: 6.4 % — ABNORMAL HIGH (ref 0.0–5.0)
HCT: 38.1 % — ABNORMAL LOW (ref 39.0–52.0)
HCT: 35.7 % — ABNORMAL LOW (ref 39.0–52.0)
Hemoglobin: 12.6 g/dL — ABNORMAL LOW (ref 13.0–17.0)
Hemoglobin: 12 g/dL — ABNORMAL LOW (ref 13.0–17.0)
LYMPHS ABS: 1 10*3/uL (ref 0.7–4.0)
Lymphocytes Relative: 18.8 % (ref 12.0–46.0)
Lymphocytes Relative: 22 %
Lymphs Abs: 1.4 10*3/uL (ref 0.7–4.0)
MCH: 27.5 pg (ref 26.0–34.0)
MCHC: 33 g/dL (ref 30.0–36.0)
MCHC: 33.6 g/dL (ref 30.0–36.0)
MCV: 86.5 fl (ref 78.0–100.0)
MCV: 81.9 fL (ref 78.0–100.0)
MONO ABS: 0.6 10*3/uL (ref 0.1–1.0)
MONO ABS: 0.4 10*3/uL (ref 0.1–1.0)
Monocytes Relative: 10.4 % (ref 3.0–12.0)
Monocytes Relative: 6 %
NEUTROS PCT: 63.9 % (ref 43.0–77.0)
Neutro Abs: 3.4 10*3/uL (ref 1.4–7.7)
Neutro Abs: 4.2 10*3/uL (ref 1.7–7.7)
Neutrophils Relative %: 68 %
PLATELETS: 320 10*3/uL (ref 150–400)
Platelets: 317 10*3/uL (ref 150.0–400.0)
RBC: 4.4 Mil/uL (ref 4.22–5.81)
RBC: 4.36 MIL/uL (ref 4.22–5.81)
RDW: 16.6 % — ABNORMAL HIGH (ref 11.5–15.5)
RDW: 15.6 % — AB (ref 11.5–15.5)
WBC: 5.3 10*3/uL (ref 4.0–10.5)
WBC: 6.2 10*3/uL (ref 4.0–10.5)

## 2017-03-10 LAB — PHOSPHORUS: PHOSPHORUS: 4.2 mg/dL (ref 2.5–4.6)

## 2017-03-10 LAB — SODIUM, URINE, RANDOM: SODIUM UR: 40 mmol/L

## 2017-03-10 LAB — MAGNESIUM: Magnesium: 2.6 mg/dL — ABNORMAL HIGH (ref 1.7–2.4)

## 2017-03-10 LAB — CREATININE, URINE, RANDOM: Creatinine, Urine: 96.32 mg/dL

## 2017-03-10 LAB — OSMOLALITY, URINE: OSMOLALITY UR: 340 mosm/kg (ref 300–900)

## 2017-03-10 LAB — URINALYSIS, ROUTINE W REFLEX MICROSCOPIC
Bilirubin Urine: NEGATIVE
GLUCOSE, UA: NEGATIVE mg/dL
HGB URINE DIPSTICK: NEGATIVE
Ketones, ur: NEGATIVE mg/dL
Leukocytes, UA: NEGATIVE
Nitrite: NEGATIVE
PH: 6 (ref 5.0–8.0)
Protein, ur: NEGATIVE mg/dL
SPECIFIC GRAVITY, URINE: 1.01 (ref 1.005–1.030)

## 2017-03-10 LAB — TSH: TSH: 5.92 u[IU]/mL — ABNORMAL HIGH (ref 0.35–4.50)

## 2017-03-10 LAB — HEMOGLOBIN A1C: HEMOGLOBIN A1C: 7.7 % — AB (ref 4.6–6.5)

## 2017-03-10 LAB — OSMOLALITY: OSMOLALITY: 342 mosm/kg — AB (ref 275–295)

## 2017-03-10 MED ORDER — CARVEDILOL 25 MG PO TABS
25.0000 mg | ORAL_TABLET | Freq: Two times a day (BID) | ORAL | Status: DC
  Administered 2017-03-11 – 2017-03-14 (×8): 25 mg via ORAL
  Filled 2017-03-10 (×8): qty 1

## 2017-03-10 MED ORDER — SENNOSIDES-DOCUSATE SODIUM 8.6-50 MG PO TABS: 1.0000 | ORAL_TABLET | Freq: Two times a day (BID) | ORAL | Status: DC | PRN

## 2017-03-10 MED ORDER — INSULIN GLARGINE 100 UNIT/ML ~~LOC~~ SOLN
10.0000 [IU] | Freq: Every day | SUBCUTANEOUS | Status: DC
  Administered 2017-03-11 – 2017-03-12 (×3): 10 [IU] via SUBCUTANEOUS
  Filled 2017-03-10 (×4): qty 0.1

## 2017-03-10 MED ORDER — POTASSIUM CHLORIDE CRYS ER 20 MEQ PO TBCR
20.0000 meq | EXTENDED_RELEASE_TABLET | Freq: Two times a day (BID) | ORAL | Status: AC
  Administered 2017-03-11 (×3): 20 meq via ORAL
  Filled 2017-03-10 (×3): qty 1

## 2017-03-10 MED ORDER — CALCITRIOL 0.25 MCG PO CAPS
0.2500 ug | ORAL_CAPSULE | Freq: Every day | ORAL | Status: DC
  Administered 2017-03-11 – 2017-03-14 (×4): 0.25 ug via ORAL
  Filled 2017-03-10 (×4): qty 1

## 2017-03-10 MED ORDER — SENNA-DOCUSATE SODIUM 8.6-50 MG PO TABS: 1.0000 | ORAL_TABLET | Freq: Two times a day (BID) | ORAL | Status: DC | PRN

## 2017-03-10 MED ORDER — ASPIRIN EC 325 MG PO TBEC
325.0000 mg | DELAYED_RELEASE_TABLET | Freq: Every day | ORAL | Status: DC
  Administered 2017-03-11 – 2017-03-14 (×5): 325 mg via ORAL
  Filled 2017-03-10 (×5): qty 1

## 2017-03-10 MED ORDER — SODIUM CHLORIDE 0.9 % IV BOLUS (SEPSIS)
500.0000 mL | Freq: Once | INTRAVENOUS | Status: AC
  Administered 2017-03-10: 500 mL via INTRAVENOUS

## 2017-03-10 MED ORDER — FREESTYLE LIBRE SENSOR SYSTEM MISC: 1.0000 [IU] | Freq: Three times a day (TID) | 0 refills | Status: DC

## 2017-03-10 MED ORDER — SODIUM CHLORIDE 0.9 % IV SOLN: 1000.0000 mL | INTRAVENOUS | Status: DC

## 2017-03-10 MED ORDER — HEPARIN SODIUM (PORCINE) 5000 UNIT/ML IJ SOLN
5000.0000 [IU] | Freq: Three times a day (TID) | INTRAMUSCULAR | Status: DC
  Administered 2017-03-11 – 2017-03-14 (×9): 5000 [IU] via SUBCUTANEOUS
  Filled 2017-03-10 (×9): qty 1

## 2017-03-10 MED ORDER — POTASSIUM CHLORIDE CRYS ER 20 MEQ PO TBCR: 20.0000 meq | EXTENDED_RELEASE_TABLET | Freq: Every day | ORAL | Status: DC

## 2017-03-10 MED ORDER — INSULIN ASPART 100 UNIT/ML ~~LOC~~ SOLN
0.0000 [IU] | Freq: Three times a day (TID) | SUBCUTANEOUS | Status: DC
  Administered 2017-03-11 (×3): 2 [IU] via SUBCUTANEOUS
  Administered 2017-03-12 (×3): 3 [IU] via SUBCUTANEOUS
  Administered 2017-03-13: 5 [IU] via SUBCUTANEOUS
  Administered 2017-03-13: 2 [IU] via SUBCUTANEOUS
  Administered 2017-03-13: 3 [IU] via SUBCUTANEOUS
  Administered 2017-03-14 (×2): 2 [IU] via SUBCUTANEOUS

## 2017-03-10 MED ORDER — SODIUM CHLORIDE 0.9 % IV SOLN
1000.0000 mL | INTRAVENOUS | Status: AC
  Administered 2017-03-10 (×2): 1000 mL via INTRAVENOUS

## 2017-03-10 MED ORDER — HYDRALAZINE HCL 50 MG PO TABS
50.0000 mg | ORAL_TABLET | Freq: Four times a day (QID) | ORAL | Status: DC
  Administered 2017-03-11: 50 mg via ORAL
  Filled 2017-03-10: qty 1

## 2017-03-10 MED ORDER — TOBRAMYCIN 0.3 % OP SOLN
1.0000 [drp] | Freq: Four times a day (QID) | OPHTHALMIC | Status: DC
  Administered 2017-03-11 – 2017-03-14 (×11): 1 [drp] via OPHTHALMIC
  Filled 2017-03-10: qty 5

## 2017-03-10 MED ORDER — ATORVASTATIN CALCIUM 10 MG PO TABS
10.0000 mg | ORAL_TABLET | Freq: Every day | ORAL | Status: DC
  Administered 2017-03-11 – 2017-03-13 (×3): 10 mg via ORAL
  Filled 2017-03-10 (×3): qty 1

## 2017-03-10 MED ORDER — ALLOPURINOL 300 MG PO TABS
150.0000 mg | ORAL_TABLET | Freq: Every day | ORAL | Status: DC
  Administered 2017-03-11 – 2017-03-14 (×4): 150 mg via ORAL
  Filled 2017-03-10 (×4): qty 1

## 2017-03-10 MED ORDER — DOXYCYCLINE MONOHYDRATE 100 MG PO TABS
50.0000 mg | ORAL_TABLET | Freq: Two times a day (BID) | ORAL | Status: DC
  Filled 2017-03-10 (×2): qty 0.5

## 2017-03-10 MED ORDER — CLONIDINE HCL 0.2 MG PO TABS
0.2000 mg | ORAL_TABLET | Freq: Two times a day (BID) | ORAL | Status: DC
  Administered 2017-03-11: 0.2 mg via ORAL
  Filled 2017-03-10: qty 1

## 2017-03-10 MED ORDER — AMLODIPINE BESYLATE 10 MG PO TABS
10.0000 mg | ORAL_TABLET | Freq: Every day | ORAL | Status: DC
  Administered 2017-03-11 – 2017-03-14 (×4): 10 mg via ORAL
  Filled 2017-03-10 (×5): qty 1

## 2017-03-10 NOTE — Progress Notes (Signed)
Subjective:  Patient ID: Casey Grimes, male    DOB: [birth year]  Age: 83 y.o. MRN: [id number]  CC: Follow-up (follow up on DM/doesnt want to eat,sleep alot/new free style libre machine consult?)  Accompanied by wife who provides most of the responses.  HPI DM: Home glucose: AM 170s-230s PM 160s-200s No hypoglycemia. Complains of sore fingers due to frequent finger sticks. Will like prescription for Sensor libre machine.  Weakness and increased sleep: Worsening weakness since completion of home rehab 35month ago. Associated with Increased somnolence and decreased appetite Reports constipation, no OTC med used,  no change in GU function. No respiratory symptoms.  Next appt with nephrology 9Oct 16, 2018 Next appt with cardiology 03/23/2017.  Outpatient Medications Prior to Visit  Medication Sig Dispense Refill  . allopurinol (ZYLOPRIM) 300 MG tablet Take 150 mg by mouth daily.      .Marland KitchenamLODipine (NORVASC) 10 MG tablet TAKE 1 TABLET BY MOUTH EVERY DAY 90 tablet 3  . aspirin EC 325 MG tablet Take 325 mg by mouth daily.    .Marland Kitchenatorvastatin (LIPITOR) 10 MG tablet TAKE 1 TABLET (10 MG TOTAL) BY MOUTH DAILY. 90 tablet 3  . B-D INS SYRINGE 0.5CC/30GX1/2" 30G X 1/2" 0.5 ML MISC USE 3 PER DAY AS DIRECTED 300 each 1  . Blood Glucose Monitoring Suppl (ONE TOUCH ULTRA SYSTEM KIT) w/Device KIT 1 kit by Does not apply route once. 1 each 0  . calcitRIOL (ROCALTROL) 0.25 MCG capsule TAKE 1 CAPSULE (0.25 MCG TOTAL) BY MOUTH DAILY. 90 capsule 3  . carvedilol (COREG) 25 MG tablet Take 1 tablet (25 mg total) by mouth 2 (two) times daily. Follow-up appt due must see provider for future refills 60 tablet 0  . cloNIDine (CATAPRES) 0.2 MG tablet TAKE 1 TABLET TWICE A DAY 60 tablet 3  . doxycycline (ADOXA) 50 MG tablet Take 50 mg by mouth 2 (two) times daily.    .Marland KitchenHUMALOG 100 UNIT/ML injection Inject 0.05 mLs (5 Units total) into the skin 3 (three) times daily with meals. 10 mL 11  . hydrALAZINE (APRESOLINE) 50 MG  tablet TAKE 1 TABLET (50 MG TOTAL) BY MOUTH 4 (FOUR) TIMES DAILY. 120 tablet 5  . hydrALAZINE (APRESOLINE) 50 MG tablet Take 1 tablet (50 mg total) by mouth 4 (four) times daily. Follow-up appt due must see provider for future refills 120 tablet 0  . insulin glargine (LANTUS) 100 UNIT/ML injection Inject 0.16 mLs (16 Units total) into the skin at bedtime. 10 mL 11  . Insulin Syringe-Needle U-100 (B-D INS SYRINGE 0.5CC/30GX1/2") 30G X 1/2" 0.5 ML MISC USE 3 PER DAY AS DIRECTED 270 each 3  . KLOR-CON M20 20 MEQ tablet TAKE 1 TABLET (20 MEQ TOTAL) BY MOUTH DAILY. 90 tablet 0  . metolazone (ZAROXOLYN) 2.5 MG tablet TAKE 1 TABLET (2.5 MG TOTAL) BY MOUTH ON MONDAY, WEDNESDAY, FRIDAY & SATURDAY 16 tablet 6  . ONE TOUCH ULTRA TEST test strip USE TO CHECK BLOOD SUGAR 4 TIMES DAILY. E11.21 100 each 2  . tobramycin (TOBREX) 0.3 % ophthalmic solution Place 1 drop into the left eye every 6 (six) hours. 5 mL 0  . torsemide (DEMADEX) 20 MG tablet TAKE 2 TABLETS (40 MG TOTAL) BY MOUTH 2 (TWO) TIMES DAILY. 120 tablet 3  . metolazone (ZAROXOLYN) 2.5 MG tablet Take 2.5 mg by mouth 4 (four) times a week. M, W, Fr, Sa    . atorvastatin (LIPITOR) 10 MG tablet TAKE 1 TABLET (10 MG TOTAL) BY MOUTH DAILY. (Patient  not taking: Reported on 03/10/2017) 90 tablet 0  . atorvastatin (LIPITOR) 10 MG tablet TAKE 1 TABLET (10 MG TOTAL) BY MOUTH DAILY. (Patient not taking: Reported on 03/10/2017) 90 tablet 0  . carvedilol (COREG) 25 MG tablet TAKE 1 TABLET BY MOUTH TWICE A DAY (Patient not taking: Reported on 03/10/2017) 180 tablet 0  . torsemide (DEMADEX) 20 MG tablet TAKE 2 TABLETS (40 MG TOTAL) BY MOUTH 2 (TWO) TIMES DAILY. (Patient not taking: Reported on 03/10/2017) 120 tablet 3   No facility-administered medications prior to visit.     ROS See HPI  Objective:  BP (!) 132/50   Pulse (!) 56   Temp 97.6 F (36.4 C)   Ht '5\' 11"'  (1.803 m)   Wt 174 lb (78.9 kg) Comment: pt's weight at home--in wheelchair cant stand up-  SpO2  96%   BMI 24.27 kg/m   BP Readings from Last 3 Encounters:  03/10/17 (!) 132/50  11/17/16 (!) 150/50  11/15/16 (!) 130/54    Wt Readings from Last 3 Encounters:  03/10/17 174 lb (78.9 kg)  11/17/16 199 lb (90.3 kg)  11/15/16 204 lb (92.5 kg)    Physical Exam  Constitutional: He is oriented to person, place, and time. No distress.  Neck: No thyromegaly present.  Cardiovascular: Normal rate and regular rhythm.   Pulmonary/Chest: Effort normal and breath sounds normal.  Abdominal: Soft. Bowel sounds are normal. He exhibits no distension. There is no tenderness.  Musculoskeletal: He exhibits no edema.  Lymphadenopathy:    He has no cervical adenopathy.  Neurological: He is alert and oriented to person, place, and time.  Skin: Skin is warm and dry. No rash noted. No erythema.  Psychiatric: He has a normal mood and affect. His behavior is normal.  Vitals reviewed.   Lab Results  Component Value Date   WBC 5.3 03/10/2017   HGB 12.6 (L) 03/10/2017   HCT 38.1 (L) 03/10/2017   PLT 317.0 03/10/2017   GLUCOSE 141 (H) 03/10/2017   CHOL 110 04/17/2015   TRIG 177.0 (H) 04/17/2015   HDL 34.10 (L) 04/17/2015   LDLDIRECT 37.7 01/17/2008   LDLCALC 40 04/17/2015   ALT 17 11/09/2016   AST 26 11/09/2016   NA 133 (L) 03/10/2017   K 2.9 (L) 03/10/2017   CL 91 (L) 03/10/2017   CREATININE 5.39 (HH) 03/10/2017   BUN 144 (HH) 03/10/2017   CO2 27 03/10/2017   TSH 5.92 (H) 03/10/2017   HGBA1C 7.7 (H) 03/10/2017    Dg Chest 2 View  Result Date: 11/10/2016 CLINICAL DATA:  Cough.  Acute on chronic diastolic heart failure. EXAM: CHEST  2 VIEW COMPARISON:  A 11/09/2016 FINDINGS: The cardiac silhouette is mildly enlarged. Aortic atherosclerosis is noted. There is pulmonary vascular congestion with patchy left greater than right perihilar and basilar lung opacities and mild diffuse interstitial opacities, overall not significantly changed. Small pleural effusions remain, again larger on the left  than on the right. No pneumothorax is seen. IMPRESSION: Similar appearance of pulmonary edema and pleural effusions. Electronically Signed   By: Logan Bores M.D.   On: 11/10/2016 07:55   Dg Chest 2 View  Result Date: 11/09/2016 CLINICAL DATA:  Progressive shortness of breath. EXAM: CHEST  2 VIEW COMPARISON:  One-view chest x-ray 04/05/2016 FINDINGS: The heart size is normal. Mild diffuse edema has increased. Asymmetric left lower lobe airspace disease is present. Bilateral pleural effusions are worse on the left. Degenerative changes are noted in the right shoulder. The visualized soft  tissues and bony thorax are otherwise unremarkable. IMPRESSION: 1. Moderate diffuse interstitial edema and bilateral pleural effusions is concerning for congestive heart failure. 2. Asymmetric left lower lobe airspace disease. While this may reflect atelectasis, left lower lobar pneumonia is also considered. Electronically Signed   By: San Morelle M.D.   On: 11/09/2016 08:57    Assessment & Plan:   Rhet was seen today for follow-up.  Diagnoses and all orders for this visit:  Generalized muscle weakness -     TSH; Future -     Basic metabolic panel; Future  Constipation, unspecified constipation type -     sennosides-docusate sodium (SENOKOT-S) 8.6-50 MG tablet; Take 1 tablet by mouth 2 (two) times daily between meals as needed for constipation.  Anemia due to chronic kidney disease -     CBC w/Diff; Future  Type 2 diabetes mellitus with diabetic neuropathy, with long-term current use of insulin (HCC) -     Continuous Blood Gluc Sensor (FREESTYLE LIBRE SENSOR SYSTEM) MISC; 1 Units by Does not apply route 4 (four) times daily -  before meals and at bedtime. Provide 30days supplies with 3refills. -     Hemoglobin A1c; Future  Acute renal failure superimposed on stage 4 chronic kidney disease, unspecified acute renal failure type ([place])   I am having Casey Grimes start on sennosides-docusate sodium  and [place]. I am also having him maintain his allopurinol, aspirin EC, Insulin Syringe-Needle U-100, ONE TOUCH ULTRA SYSTEM KIT, atorvastatin, torsemide, HUMALOG, amLODipine, calcitRIOL, B-D INS SYRINGE 0.5CC/30GX1/2", metolazone, tobramycin, insulin glargine, metolazone, doxycycline, cloNIDine, ONE TOUCH ULTRA TEST, KLOR-CON M20, hydrALAZINE, hydrALAZINE, and carvedilol.  Meds ordered this encounter  Medications  . sennosides-docusate sodium (SENOKOT-S) 8.6-50 MG tablet    Sig: Take 1 tablet by mouth 2 (two) times daily between meals as needed for constipation.    Order Specific Question:   Supervising Provider    Answer:   Cassandria Anger [[id number]]  . Continuous Blood Gluc Sensor (FREESTYLE LIBRE SENSOR SYSTEM) MISC    Sig: 1 Units by Does not apply route 4 (four) times daily -  before meals and at bedtime. Provide 30days supplies with 3refills.    Dispense:  1 each    Refill:  0    Order Specific Question:   Supervising Provider    Answer:   Cassandria Anger [[id number]]    Follow-up: Return in about 4 weeks (around April 10, 2017) for with Dr. Sharlet Salina.  Wilfred Lacy, NP
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
## 2017-03-10 NOTE — Patient Instructions (Addendum)
Go to basement for blood draw.  Wait for PA approval for sensor machine.  Contact Mrs. Draegan Carrara about husband's lab results. Due to worsening kidney function and electrolyte imbalance; she was advised to take patient to hospital today for further evaluation and treatment. She agreed to take patient to hospital today.  Constipation, Adult Constipation is when a person:  Poops (has a bowel movement) fewer times in a week than normal.  Has a hard time pooping.  Has poop that is dry, hard, or bigger than normal.  Follow these instructions at home: Eating and drinking   Eat foods that have a lot of fiber, such as: ? Fresh fruits and vegetables. ? Whole grains. ? Beans.  Eat less of foods that are high in fat, low in fiber, or overly processed, such as: ? [place] fries. ? Hamburgers. ? Cookies. ? Candy. ? Soda.  Drink enough fluid to keep your pee (urine) clear or pale yellow. General instructions  Exercise regularly or as told by your doctor.  Go to the restroom when you feel like you need to poop. Do not hold it in.  Take over-the-counter and prescription medicines only as told by your doctor. These include any fiber supplements.  Do pelvic floor retraining exercises, such as: ? Doing deep breathing while relaxing your lower belly (abdomen). ? Relaxing your pelvic floor while pooping.  Watch your condition for any changes.  Keep all follow-up visits as told by your doctor. This is important. Contact a doctor if:  You have pain that gets worse.  You have a fever.  You have not pooped for 4 days.  You throw up (vomit).  You are not hungry.  You lose weight.  You are bleeding from the anus.  You have thin, pencil-like poop (stool). Get help right away if:  You have a fever, and your symptoms suddenly get worse.  You leak poop or have blood in your poop.  Your belly feels hard or bigger than normal (is bloated).  You have very bad belly  pain.  You feel dizzy or you faint. This information is not intended to replace advice given to you by your health care provider. Make sure you discuss any questions you have with your health care provider. Document Released: 12/22/2007 Document Revised: 01/23/2016 Document Reviewed: 12/24/2015 Elsevier Interactive Patient Education  2017 [employer].

## 2017-03-10 NOTE — ED Triage Notes (Signed)
Pt to ED via [facility] with c/o of-- abnormal labs from this am-- had labs drawn at [place]'s office and was called and told to come here. BUN and Creatinine are more elevated than usual.

## 2017-03-11 ENCOUNTER — Observation Stay (HOSPITAL_COMMUNITY): Payer: Medicare Other

## 2017-03-11 DIAGNOSIS — E0821 Diabetes mellitus due to underlying condition with diabetic nephropathy: Secondary | ICD-10-CM | POA: Diagnosis not present

## 2017-03-11 DIAGNOSIS — N179 Acute kidney failure, unspecified: Secondary | ICD-10-CM | POA: Diagnosis not present

## 2017-03-11 DIAGNOSIS — R531 Weakness: Secondary | ICD-10-CM | POA: Diagnosis not present

## 2017-03-11 DIAGNOSIS — Z8673 Personal history of transient ischemic attack (TIA), and cerebral infarction without residual deficits: Secondary | ICD-10-CM | POA: Diagnosis not present

## 2017-03-11 LAB — GLUCOSE, CAPILLARY
GLUCOSE-CAPILLARY: 172 mg/dL — AB (ref 65–99)
Glucose-Capillary: 188 mg/dL — ABNORMAL HIGH (ref 65–99)
Glucose-Capillary: 234 mg/dL — ABNORMAL HIGH (ref 65–99)

## 2017-03-11 LAB — URINALYSIS, ROUTINE W REFLEX MICROSCOPIC
Bilirubin Urine: NEGATIVE
GLUCOSE, UA: NEGATIVE mg/dL
HGB URINE DIPSTICK: NEGATIVE
KETONES UR: NEGATIVE mg/dL
Nitrite: NEGATIVE
PH: 6 (ref 5.0–8.0)
Protein, ur: 100 mg/dL — AB
SQUAMOUS EPITHELIAL / LPF: NONE SEEN
Specific Gravity, Urine: 1.01 (ref 1.005–1.030)

## 2017-03-11 LAB — COMPREHENSIVE METABOLIC PANEL
ALT: 11 U/L — AB (ref 17–63)
AST: 16 U/L (ref 15–41)
Albumin: 3.2 g/dL — ABNORMAL LOW (ref 3.5–5.0)
Alkaline Phosphatase: 43 U/L (ref 38–126)
Anion gap: 13 (ref 5–15)
BILIRUBIN TOTAL: 0.6 mg/dL (ref 0.3–1.2)
BUN: 139 mg/dL — ABNORMAL HIGH (ref 6–20)
CO2: 26 mmol/L (ref 22–32)
CREATININE: 5 mg/dL — AB (ref 0.61–1.24)
Calcium: 9.7 mg/dL (ref 8.9–10.3)
Chloride: 96 mmol/L — ABNORMAL LOW (ref 101–111)
GFR calc Af Amer: 11 mL/min — ABNORMAL LOW (ref >60)
GFR, EST NON AFRICAN AMERICAN: 9 mL/min — AB (ref >60)
Glucose, Bld: 179 mg/dL — ABNORMAL HIGH (ref 65–99)
Potassium: 2.9 mmol/L — ABNORMAL LOW (ref 3.5–5.1)
Sodium: 135 mmol/L (ref 135–145)
TOTAL PROTEIN: 7.4 g/dL (ref 6.5–8.1)

## 2017-03-11 LAB — RENAL FUNCTION PANEL
ALBUMIN: 3.1 g/dL — AB (ref 3.5–5.0)
ALBUMIN: 3.2 g/dL — AB (ref 3.5–5.0)
ANION GAP: 13 (ref 5–15)
ANION GAP: 10 (ref 5–15)
BUN: 141 mg/dL — ABNORMAL HIGH (ref 6–20)
BUN: 137 mg/dL — ABNORMAL HIGH (ref 6–20)
CALCIUM: 9.7 mg/dL (ref 8.9–10.3)
CALCIUM: 9.6 mg/dL (ref 8.9–10.3)
CO2: 26 mmol/L (ref 22–32)
CO2: 26 mmol/L (ref 22–32)
CREATININE: 4.93 mg/dL — AB (ref 0.61–1.24)
Chloride: 96 mmol/L — ABNORMAL LOW (ref 101–111)
Chloride: 101 mmol/L (ref 101–111)
Creatinine, Ser: 4.72 mg/dL — ABNORMAL HIGH (ref 0.61–1.24)
GFR, EST AFRICAN AMERICAN: 11 mL/min — AB (ref >60)
GFR, EST AFRICAN AMERICAN: 12 mL/min — AB (ref >60)
GFR, EST NON AFRICAN AMERICAN: 10 mL/min — AB (ref >60)
GFR, EST NON AFRICAN AMERICAN: 10 mL/min — AB (ref >60)
GLUCOSE: 190 mg/dL — AB (ref 65–99)
Glucose, Bld: 177 mg/dL — ABNORMAL HIGH (ref 65–99)
PHOSPHORUS: 3.6 mg/dL (ref 2.5–4.6)
PHOSPHORUS: 3.7 mg/dL (ref 2.5–4.6)
POTASSIUM: 3.3 mmol/L — AB (ref 3.5–5.1)
Potassium: 3 mmol/L — ABNORMAL LOW (ref 3.5–5.1)
SODIUM: 135 mmol/L (ref 135–145)
Sodium: 137 mmol/L (ref 135–145)

## 2017-03-11 LAB — CBC
HCT: 32.6 % — ABNORMAL LOW (ref 39.0–52.0)
Hemoglobin: 10.8 g/dL — ABNORMAL LOW (ref 13.0–17.0)
MCH: 27.3 pg (ref 26.0–34.0)
MCHC: 33.1 g/dL (ref 30.0–36.0)
MCV: 82.3 fL (ref 78.0–100.0)
PLATELETS: 264 10*3/uL (ref 150–400)
RBC: 3.96 MIL/uL — ABNORMAL LOW (ref 4.22–5.81)
RDW: 15.5 % (ref 11.5–15.5)
WBC: 4.8 10*3/uL (ref 4.0–10.5)

## 2017-03-11 LAB — MAGNESIUM: MAGNESIUM: 2.4 mg/dL (ref 1.7–2.4)

## 2017-03-11 MED ORDER — ENSURE ENLIVE PO LIQD
237.0000 mL | Freq: Two times a day (BID) | ORAL | Status: DC
  Administered 2017-03-12 – 2017-03-14 (×6): 237 mL via ORAL

## 2017-03-11 MED ORDER — HYDRALAZINE HCL 25 MG PO TABS
25.0000 mg | ORAL_TABLET | Freq: Three times a day (TID) | ORAL | Status: DC
  Administered 2017-03-11 – 2017-03-14 (×9): 25 mg via ORAL
  Filled 2017-03-11 (×9): qty 1

## 2017-03-11 MED ORDER — DOXYCYCLINE HYCLATE 50 MG PO CAPS
50.0000 mg | ORAL_CAPSULE | Freq: Two times a day (BID) | ORAL | Status: DC
  Administered 2017-03-11 – 2017-03-14 (×7): 50 mg via ORAL
  Filled 2017-03-11 (×8): qty 1

## 2017-03-11 MED ORDER — SODIUM CHLORIDE 0.9 % IV SOLN
INTRAVENOUS | Status: AC
Start: 2017-03-11 — End: 2017-03-12

## 2017-03-11 MED ORDER — SODIUM CHLORIDE 0.9 % IV SOLN
INTRAVENOUS | Status: AC
  Administered 2017-03-11: 12:00:00 via INTRAVENOUS

## 2017-03-11 MED ORDER — CLONIDINE HCL 0.1 MG PO TABS
0.1000 mg | ORAL_TABLET | Freq: Two times a day (BID) | ORAL | Status: DC
  Administered 2017-03-11 – 2017-03-12 (×3): 0.1 mg via ORAL
  Filled 2017-03-11 (×3): qty 1

## 2017-03-11 MED ORDER — NEPRO/CARBSTEADY PO LIQD
237.0000 mL | Freq: Two times a day (BID) | ORAL | Status: DC
Start: 2017-03-11 — End: 2017-03-11
  Administered 2017-03-11: 237 mL via ORAL
  Filled 2017-03-11 (×4): qty 237

## 2017-03-11 NOTE — Care Management (Signed)
03/11/2017  ABN explained to pt wife by phone, as pt is unable to comprehend or sign at this time. Per pt wife, [name] she understands possibility that pt insurance my not cover extend stay. Drs Deterding and Dr Elmarie Shiley notified. Plan adm IV fluid and d/c this pt.

## 2017-03-11 NOTE — Progress Notes (Signed)
PROGRESS NOTE    Casey Grimes  MEQ:[id number] DOB: [birth year] DOA: 03/10/2017 PCP: Casey Broker, MD (Confirm with patient/family/NH records and if not entered, this HAS to be entered at [facility] point of entry. "No PCP" if truly none.)   Brief Narrative: (Start on day 1 of progress note - keep it brief and live) 83 y.o. male with medical history significant of CKD V, HFpEF, type 2 diabetes, stroke, hypertension, dementia presenting with worsening kidney function which appears to have been due to dehydration. Patient was started on gentle hydration and had his electrolytes gently repleted and his diuretics were held.   Assessment & Plan:   Principal Problem:   AKI (acute kidney injury) (HCC) Active Problems:   Diabetes mellitus with diabetic nephropathy (HCC)   Gout   Uncontrolled hypertension   Weakness   History of CVA (cerebrovascular accident)   Uremia   Hypokalemia   Hyponatremia   AKI on CKD V: Appears due to dehydration.  baseline Cr around 3.2-3.4, downtrending with fluids.  Peaked at 5.34.  He generally appears euvolemic to hypovolemic.  No lower extremity edema or sacral edema.  - strict I/O, foley placed - UA, urine urea pending - renal US pending - nephrology following, appreciate recs - calcitriol - daily renal panel - telemetry (sinus with PVCs) - CXR with atelectasis vs mild basilar interstital edema, my lung exam clear ctm  Poor appetite and weight loss: may be due to dementia and worsening renal function.  Will have nutrition see him.  Last colonoscopy was in 2004 without polyps seen.  Will need follow up outpatient.  Wt Readings from Last 3 Encounters:  03/10/17 78.8 kg (173 lb 11.6 oz)  03/10/17 78.9 kg (174 lb)  11/17/16 90.3 kg (199 lb)    Hypokalemia:  - gentle repletion with AKI on CKD  Hyponatremia:  - likely hypovolemic, improved with IVF  Anemia - ctm, 10.8 today  T2DM:  - decrease lantus to 10 units from 16 given AKI - hold  mealtime and use SSI for now  Hx CVA - continue ASA, statin   Dementia - delirium precautions  HTN: - continue amlodipine, carvedilol, clonidine, hydralazine  - holding torsemide and metolazone  Gout: - continue allopurinol and doxycycline  Prolonged QTc: ctm Repeat EKG, improved without artifact, sinus rhythm, prolonged QT, LVH   DVT prophylaxis: heparin Code Status: full  Family Communication: neprhology Disposition Plan: home tomorrow   Consultants:   nephrology  Procedures: (Don't include imaging studies which can be auto populated. Include things that cannot be auto populated i.e. Echo, Carotid and venous dopplers, Foley, Bipap, HD, tubes/drains, wound vac, central lines etc)  Renal US pending  Antimicrobials: (specify start and planned stop date. Auto populated tables are space occupying and do not give end dates)  none    Subjective: Denies pain, sob, cp.  Discussed with wife to update.   Objective: Vitals:   03/10/17 2125 03/11/17 0458 03/11/17 0915 03/11/17 1804  BP: (!) 155/62 (!) 126/52 (!) 146/55 (!) 150/65  Pulse: 89 66 66 64  Resp: 18 19 19 19   Temp: 98.4 F (36.9 C) 98.5 F (36.9 C) 98.6 F (37 C) 97.9 F (36.6 C)  TempSrc: Oral Oral Oral Oral  SpO2: 100% 100% 100% 100%  Weight: 78.8 kg (173 lb 11.6 oz)     Height: 5\' 11"  (1.803 m)       Intake/Output Summary (Last 24 hours) at 03/11/17 1818 Last data filed at 03/11/17 1805  Gross  per 24 hour  Intake           1212.5 ml  Output             1000 ml  Net            212.5 ml   Filed Weights   03/10/17 2125  Weight: 78.8 kg (173 lb 11.6 oz)    Examination:  General exam: Appears calm and comfortable  Respiratory system: Clear to auscultation. Respiratory effort normal. Cardiovascular system: S1 & S2 heard, RRR. No JVD, murmurs, rubs, gallops or clicks. No pedal edema. Gastrointestinal system: Abdomen is nondistended, soft and nontender. No organomegaly or masses felt. Normal  bowel sounds heard. Central nervous system:  No focal deficits Skin: No rashes, lesions or ulcers Psychiatry: pleasantly demented    Data Reviewed: I have personally reviewed following labs and imaging studies  CBC:  Recent Labs Lab 03/10/17 1054 03/10/17 1806 03/10/17 2302 03/11/17 0303  WBC 5.3 5.2 6.2 4.8  NEUTROABS 3.4  --  4.2  --   HGB 12.6* 12.2* 12.0* 10.8*  HCT 38.1* 36.0* 35.7* 32.6*  MCV 86.5 81.8 81.9 82.3  PLT 317.0 291 320 264   Basic Metabolic Panel:  Recent Labs Lab 03/10/17 1054 03/10/17 1806 03/11/17 0303 03/11/17 1555  NA 133* 132* 135  135 137  K 2.9* 3.0* 2.9*  3.0* 3.3*  CL 91* 93* 96*  96* 101  CO2 27 26 26  26 26   GLUCOSE 141* 246* 179*  177* 190*  BUN 144* 146* 139*  141* 137*  CREATININE 5.39* 5.34* 5.00*  4.93* 4.72*  CALCIUM 10.7* 10.0 9.7  9.7 9.6  MG  --  2.6* 2.4  --   PHOS  --  4.2 3.6 3.7   GFR: Estimated Creatinine Clearance: 12 mL/min (Bronda Alfred) (by C-G formula based on SCr of 4.72 mg/dL (H)). Liver Function Tests:  Recent Labs Lab 03/10/17 1806 03/11/17 0303 03/11/17 1555  AST 17 16  --   ALT 12* 11*  --   ALKPHOS 54 43  --   BILITOT 0.5 0.6  --   PROT 8.2* 7.4  --   ALBUMIN 3.6 3.2*  3.1* 3.2*   No results for input(s): LIPASE, AMYLASE in the last 168 hours. No results for input(s): AMMONIA in the last 168 hours. Coagulation Profile: No results for input(s): INR, PROTIME in the last 168 hours. Cardiac Enzymes: No results for input(s): CKTOTAL, CKMB, CKMBINDEX, TROPONINI in the last 168 hours. BNP (last 3 results) No results for input(s): PROBNP in the last 8760 hours. HbA1C:  Recent Labs  03/10/17 1054  HGBA1C 7.7*   CBG:  Recent Labs Lab 03/11/17 0825 03/11/17 1229  GLUCAP 188* 172*   Lipid Profile: No results for input(s): CHOL, HDL, LDLCALC, TRIG, CHOLHDL, LDLDIRECT in the last 72 hours. Thyroid Function Tests:  Recent Labs  03/10/17 1054  TSH 5.92*   Anemia Panel: No results for  input(s): VITAMINB12, FOLATE, FERRITIN, TIBC, IRON, RETICCTPCT in the last 72 hours. Sepsis Labs: No results for input(s): PROCALCITON, LATICACIDVEN in the last 168 hours.  No results found for this or any previous visit (from the past 240 hour(s)).       Radiology Studies: Dg Chest 2 View  Result Date: 03/10/2017 CLINICAL DATA:  Weakness, renal failure, possible fluid overload. History of CHF, diabetes, CVA, renal insufficiency. Former smoker. EXAM: CHEST  2 VIEW COMPARISON:  Chest x-ray of November 10, 2016 FINDINGS: The lungs are reasonably well inflated. Patchy  increased density is present at the lung bases but is not as conspicuous as on the previous study. The heart is top-normal in size. The pulmonary vascularity is normal. There is calcification in the wall of the thoracic aorta. The mediastinum is normal in width. The bony thorax exhibits no acute abnormality. IMPRESSION: Bibasilar atelectasis or possible or mild basilar interstitial edema. No definite cephalization of the vascular pattern however. Thoracic aortic atherosclerosis. Electronically Signed   By: David  [place] M.D.   On: 03/10/2017 15:43        Scheduled Meds: . allopurinol  150 mg Oral Daily  . amLODipine  10 mg Oral Daily  . aspirin EC  325 mg Oral Daily  . atorvastatin  10 mg Oral q1800  . calcitRIOL  0.25 mcg Oral Daily  . carvedilol  25 mg Oral BID  . cloNIDine  0.1 mg Oral BID  . doxycycline  50 mg Oral BID  . feeding supplement (ENSURE ENLIVE)  237 mL Oral BID BM  . heparin  5,000 Units Subcutaneous Q8H  . hydrALAZINE  25 mg Oral TID  . insulin aspart  0-9 Units Subcutaneous TID WC  . insulin glargine  10 Units Subcutaneous QHS  . potassium chloride  20 mEq Oral BID  . tobramycin  1 drop Left Eye Q6H   Continuous Infusions: . sodium chloride       LOS: 0 days    Time spent: 70    Anjelina Dung Grier Mitts, MD [facility] [phone] If 7PM-7AM, please contact  night-coverage [email or web address] Password [email or web address] 03/11/2017, 6:18 PM

## 2017-03-11 NOTE — Care Management Obs Status (Signed)
MEDICARE OBSERVATION STATUS NOTIFICATION   Patient Details  Name: Casey Grimes MRN: [id number] Date of Birth: 02-17-1929   Medicare Observation Status Notification Given:  Yes  Pt unable to understand or sign, this CM explained in detail to pt wife by phone, [name].  [name], [name], RN 03/11/2017, 5:45 PM

## 2017-03-11 NOTE — Progress Notes (Signed)
Subjective: Interval History: has no complaint, "ok".  Objective: Vital signs in last 24 hours: Temp:  [97.6 F (36.4 C)-98.6 F (37 C)] 98.6 F (37 C) (08/24 0915) Pulse Rate:  [66-89] 66 (08/24 0915) Resp:  [14-19] 19 (08/24 0915) BP: (126-155)/(52-69) 146/55 (08/24 0915) SpO2:  [98 %-100 %] 100 % (08/24 0915) Weight:  [78.8 kg (173 lb 11.6 oz)] 78.8 kg (173 lb 11.6 oz) (08/23 2125) Weight change:   Intake/Output from previous day: 08/23 0701 - 08/24 0700 In: 1430 [I.V.:930; IV Piggyback:500] Out: 1000 [Urine:1000] Intake/Output this shift: No intake/output data recorded.  General appearance: cooperative, no distress and slowed mentation Resp: diminished breath sounds bilaterally and rales bibasilar Cardio: S1, S2 normal and systolic murmur: systolic ejection 2/6, decrescendo at 2nd left intercostal space GI: soft, non-tender; bowel sounds normal; no masses,  no organomegaly Extremities: extremities normal, atraumatic, no cyanosis or edema  Lab Results:  Recent Labs  03/10/17 2302 03/11/17 0303  WBC 6.2 4.8  HGB 12.0* 10.8*  HCT 35.7* 32.6*  PLT 320 264   BMET:  Recent Labs  03/10/17 1806 03/11/17 0303  NA 132* 135  135  K 3.0* 2.9*  3.0*  CL 93* 96*  96*  CO2 26 26  26   GLUCOSE 246* 179*  177*  BUN 146* 139*  141*  CREATININE 5.34* 5.00*  4.93*  CALCIUM 10.0 9.7  9.7   No results for input(s): PTH in the last 72 hours. Iron Studies: No results for input(s): IRON, TIBC, TRANSFERRIN, FERRITIN in the last 72 hours.  Studies/Results: Dg Chest 2 View  Result Date: 03/10/2017 CLINICAL DATA:  Weakness, renal failure, possible fluid overload. History of CHF, diabetes, CVA, renal insufficiency. Former smoker. EXAM: CHEST  2 VIEW COMPARISON:  Chest x-ray of November 10, 2016 FINDINGS: The lungs are reasonably well inflated. Patchy increased density is present at the lung bases but is not as conspicuous as on the previous study. The heart is top-normal in  size. The pulmonary vascularity is normal. There is calcification in the wall of the thoracic aorta. The mediastinum is normal in width. The bony thorax exhibits no acute abnormality. IMPRESSION: Bibasilar atelectasis or possible or mild basilar interstitial edema. No definite cephalization of the vascular pattern however. Thoracic aortic atherosclerosis. Electronically Signed   By: David  [place] M.D.   On: 03/10/2017 15:43    I have reviewed the patient's current medications.  Assessment/Plan:  1 CKD with some azotemia.  Will cont slow ivf.  Getting K.  Will treat conservatively only 2 HTN lower meds 3 Anemia not needing esa 4 HPTH 5 DM 6 Dementia P Conservative mgmt only,  Ivf, lower bp meds, K po    LOS: 0 days   Leveda Kendrix L 03/11/2017,10:14 AM

## 2017-03-11 NOTE — Progress Notes (Signed)
Initial Nutrition Assessment  DOCUMENTATION CODES:   Severe malnutrition in context of chronic illness  INTERVENTION:   Ensure Enlive po BID, each supplement provides 350 kcal and 20 grams of protein  Recommend liberalizing diet  NUTRITION DIAGNOSIS:   Malnutrition (Severe) related to chronic illness (CKD V, dementia, CHF) as evidenced by severe depletion of muscle mass, moderate depletion of body fat, percent weight loss.  GOAL:   Patient will meet greater than or equal to 90% of their needs  MONITOR:   PO intake, Supplement acceptance, Labs, Weight trends  REASON FOR ASSESSMENT:   Malnutrition Screening Tool    ASSESSMENT:   83 yo male admitted with AKI on CKD V. Pt with hx of CKD V, CHF, DM, stroke, HTN, dementia   Noted no plan for dialysis as pt has refused. Conservative management at this time  Pt did not participate much in exam, did not really answer questions  10 pound wt loss over the past few months with decreased appetite (5.5% wt loss). Per weight encounters, pt with 13.1% wt loss in past 3-4 months which is significant for time frame.   Nutrition-Focused physical exam completed. Findings are moderate fat depletion, mil/moderate to severe muscle depletion, and no edema.   Labs: potassium 3.0, Creatinine 5.0, BUN 139, phosphorus wdl Meds: calcitriol, KCl, ss novolog, lantus  Diet Order:  Diet renal/carb modified with fluid restriction Diet-HS Snack? Nothing; Room service appropriate? Yes; Fluid consistency: Thin  Skin:  Reviewed, no issues  Last BM:  no documented BM  Height:   Ht Readings from Last 1 Encounters:  03/10/17 5\' 11"  (1.803 m)    Weight:   Wt Readings from Last 1 Encounters:  03/10/17 173 lb 11.6 oz (78.8 kg)    BMI:  Body mass index is 24.23 kg/m.  Estimated Nutritional Needs:   Kcal:  1700-1900 kcals   Protein:  85-95 g  Fluid:  >/= 1.7 L  EDUCATION NEEDS:   No education needs identified at this time  Romelle Starcher  MS, RD, LDN [phone] Pager  [phone] Weekend/On-Call Pager

## 2017-03-12 ENCOUNTER — Encounter (HOSPITAL_COMMUNITY): Payer: Self-pay | Admitting: *Deleted

## 2017-03-12 DIAGNOSIS — N2581 Secondary hyperparathyroidism of renal origin: Secondary | ICD-10-CM | POA: Diagnosis present

## 2017-03-12 DIAGNOSIS — I5032 Chronic diastolic (congestive) heart failure: Secondary | ICD-10-CM | POA: Diagnosis present

## 2017-03-12 DIAGNOSIS — N185 Chronic kidney disease, stage 5: Secondary | ICD-10-CM | POA: Diagnosis present

## 2017-03-12 DIAGNOSIS — M109 Gout, unspecified: Secondary | ICD-10-CM | POA: Diagnosis present

## 2017-03-12 DIAGNOSIS — E1122 Type 2 diabetes mellitus with diabetic chronic kidney disease: Secondary | ICD-10-CM | POA: Diagnosis present

## 2017-03-12 DIAGNOSIS — N179 Acute kidney failure, unspecified: Secondary | ICD-10-CM | POA: Diagnosis present

## 2017-03-12 DIAGNOSIS — Z8673 Personal history of transient ischemic attack (TIA), and cerebral infarction without residual deficits: Secondary | ICD-10-CM | POA: Diagnosis not present

## 2017-03-12 DIAGNOSIS — Z79899 Other long term (current) drug therapy: Secondary | ICD-10-CM | POA: Diagnosis not present

## 2017-03-12 DIAGNOSIS — E1142 Type 2 diabetes mellitus with diabetic polyneuropathy: Secondary | ICD-10-CM | POA: Diagnosis present

## 2017-03-12 DIAGNOSIS — D631 Anemia in chronic kidney disease: Secondary | ICD-10-CM | POA: Diagnosis present

## 2017-03-12 DIAGNOSIS — E785 Hyperlipidemia, unspecified: Secondary | ICD-10-CM | POA: Diagnosis present

## 2017-03-12 DIAGNOSIS — E0821 Diabetes mellitus due to underlying condition with diabetic nephropathy: Secondary | ICD-10-CM | POA: Diagnosis not present

## 2017-03-12 DIAGNOSIS — E876 Hypokalemia: Secondary | ICD-10-CM | POA: Diagnosis present

## 2017-03-12 DIAGNOSIS — I132 Hypertensive heart and chronic kidney disease with heart failure and with stage 5 chronic kidney disease, or end stage renal disease: Secondary | ICD-10-CM | POA: Diagnosis present

## 2017-03-12 DIAGNOSIS — E871 Hypo-osmolality and hyponatremia: Secondary | ICD-10-CM | POA: Diagnosis present

## 2017-03-12 DIAGNOSIS — Z87891 Personal history of nicotine dependence: Secondary | ICD-10-CM | POA: Diagnosis not present

## 2017-03-12 DIAGNOSIS — Z7982 Long term (current) use of aspirin: Secondary | ICD-10-CM | POA: Diagnosis not present

## 2017-03-12 DIAGNOSIS — E860 Dehydration: Secondary | ICD-10-CM | POA: Diagnosis present

## 2017-03-12 DIAGNOSIS — Z794 Long term (current) use of insulin: Secondary | ICD-10-CM | POA: Diagnosis not present

## 2017-03-12 DIAGNOSIS — E861 Hypovolemia: Secondary | ICD-10-CM | POA: Diagnosis present

## 2017-03-12 DIAGNOSIS — F0390 Unspecified dementia without behavioral disturbance: Secondary | ICD-10-CM | POA: Diagnosis present

## 2017-03-12 LAB — RENAL FUNCTION PANEL
ALBUMIN: 3.1 g/dL — AB (ref 3.5–5.0)
ANION GAP: 11 (ref 5–15)
Albumin: 3.5 g/dL (ref 3.5–5.0)
Anion gap: 11 (ref 5–15)
BUN: 129 mg/dL — AB (ref 6–20)
BUN: 125 mg/dL — AB (ref 6–20)
CHLORIDE: 101 mmol/L (ref 101–111)
CHLORIDE: 101 mmol/L (ref 101–111)
CO2: 24 mmol/L (ref 22–32)
CO2: 25 mmol/L (ref 22–32)
Calcium: 9.7 mg/dL (ref 8.9–10.3)
Calcium: 10 mg/dL (ref 8.9–10.3)
Creatinine, Ser: 4.29 mg/dL — ABNORMAL HIGH (ref 0.61–1.24)
Creatinine, Ser: 4.37 mg/dL — ABNORMAL HIGH (ref 0.61–1.24)
GFR calc Af Amer: 13 mL/min — ABNORMAL LOW (ref >60)
GFR calc Af Amer: 13 mL/min — ABNORMAL LOW (ref >60)
GFR calc non Af Amer: 11 mL/min — ABNORMAL LOW (ref >60)
GFR calc non Af Amer: 11 mL/min — ABNORMAL LOW (ref >60)
GLUCOSE: 256 mg/dL — AB (ref 65–99)
GLUCOSE: 192 mg/dL — AB (ref 65–99)
Phosphorus: 3.2 mg/dL (ref 2.5–4.6)
Phosphorus: 3.4 mg/dL (ref 2.5–4.6)
Potassium: 3.6 mmol/L (ref 3.5–5.1)
Potassium: 3.8 mmol/L (ref 3.5–5.1)
Sodium: 136 mmol/L (ref 135–145)
Sodium: 137 mmol/L (ref 135–145)

## 2017-03-12 LAB — GLUCOSE, CAPILLARY
GLUCOSE-CAPILLARY: 209 mg/dL — AB (ref 65–99)
GLUCOSE-CAPILLARY: 290 mg/dL — AB (ref 65–99)
Glucose-Capillary: 247 mg/dL — ABNORMAL HIGH (ref 65–99)
Glucose-Capillary: 226 mg/dL — ABNORMAL HIGH (ref 65–99)

## 2017-03-12 LAB — CBC
HEMATOCRIT: 32.9 % — AB (ref 39.0–52.0)
Hemoglobin: 11 g/dL — ABNORMAL LOW (ref 13.0–17.0)
MCH: 27.8 pg (ref 26.0–34.0)
MCHC: 33.4 g/dL (ref 30.0–36.0)
MCV: 83.3 fL (ref 78.0–100.0)
Platelets: 304 10*3/uL (ref 150–400)
RBC: 3.95 MIL/uL — ABNORMAL LOW (ref 4.22–5.81)
RDW: 16 % — AB (ref 11.5–15.5)
WBC: 5.9 10*3/uL (ref 4.0–10.5)

## 2017-03-12 LAB — UREA NITROGEN, URINE: UREA NITROGEN UR: 439 mg/dL

## 2017-03-12 MED ORDER — SODIUM CHLORIDE 0.9 % IV SOLN
INTRAVENOUS | Status: AC
  Administered 2017-03-12: 09:00:00 via INTRAVENOUS

## 2017-03-12 NOTE — Progress Notes (Signed)
Subjective: Interval History: has no complaint , no appetite.  Objective: Vital signs in last 24 hours: Temp:  [97.9 F (36.6 C)-98.6 F (37 C)] 98.4 F (36.9 C) (08/25 0802) Pulse Rate:  [64-73] 70 (08/25 0802) Resp:  [17-19] 18 (08/25 0802) BP: (139-150)/(52-65) 145/54 (08/25 0802) SpO2:  [95 %-100 %] 95 % (08/25 0802) Weight:  [77.6 kg (171 lb)] 77.6 kg (171 lb) (08/24 2100) Weight change: -1.235 kg (-2 lb 11.6 oz)  Intake/Output from previous day: 08/24 0701 - 08/25 0700 In: 282.5 [P.O.:180; I.V.:102.5] Out: 1225 [Urine:1225] Intake/Output this shift: No intake/output data recorded.  General appearance: cooperative, no distress and slowed mentation Resp: diminished breath sounds bilaterally Cardio: S1, S2 normal and systolic murmur: systolic ejection 2/6, decrescendo at 2nd left intercostal space GI: soft, non-tender; bowel sounds normal; no masses,  no organomegaly Extremities: extremities normal, atraumatic, no cyanosis or edema  Ox2  Lab Results:  Recent Labs  03/10/17 2302 03/11/17 0303  WBC 6.2 4.8  HGB 12.0* 10.8*  HCT 35.7* 32.6*  PLT 320 264   BMET:  Recent Labs  03/11/17 0303 03/11/17 1555  NA 135  135 137  K 2.9*  3.0* 3.3*  CL 96*  96* 101  CO2 26  26 26   GLUCOSE 179*  177* 190*  BUN 139*  141* 137*  CREATININE 5.00*  4.93* 4.72*  CALCIUM 9.7  9.7 9.6   No results for input(s): PTH in the last 72 hours. Iron Studies: No results for input(s): IRON, TIBC, TRANSFERRIN, FERRITIN in the last 72 hours.  Studies/Results: Dg Chest 2 View  Result Date: 03/10/2017 CLINICAL DATA:  Weakness, renal failure, possible fluid overload. History of CHF, diabetes, CVA, renal insufficiency. Former smoker. EXAM: CHEST  2 VIEW COMPARISON:  Chest x-ray of November 10, 2016 FINDINGS: The lungs are reasonably well inflated. Patchy increased density is present at the lung bases but is not as conspicuous as on the previous study. The heart is top-normal in size.  The pulmonary vascularity is normal. There is calcification in the wall of the thoracic aorta. The mediastinum is normal in width. The bony thorax exhibits no acute abnormality. IMPRESSION: Bibasilar atelectasis or possible or mild basilar interstitial edema. No definite cephalization of the vascular pattern however. Thoracic aortic atherosclerosis. Electronically Signed   By: David  [place] M.D.   On: 03/10/2017 15:43   US Renal  Result Date: 03/11/2017 CLINICAL DATA:  Acute kidney injury. History of chronic kidney disease. EXAM: RENAL / URINARY TRACT ULTRASOUND COMPLETE COMPARISON:  None. FINDINGS: Right Kidney: Length: 10.7 cm. Diffuse increase in renal parenchymal echogenicity. Cyst in the mid kidney measures 9 mm. No solid mass or hydronephrosis visualized. Left Kidney: Length: 9.0 cm. Diffuse increase in renal parenchymal echogenicity. No mass or hydronephrosis visualized. Bladder: Decompressed by Foley catheter not well evaluated. IMPRESSION: Increased renal parenchymal echogenicity consistent with chronic medical renal disease. No obstructive uropathy. Electronically Signed   By: Rubye Oaks M.D.   On: 03/11/2017 21:06    I have reviewed the patient's current medications.  Assessment/Plan: 1 CKD 5, azotemia slowly better with ivf. Would cont. Not Long term candidate with dementia , advanced age. 2 Dementia 3 CHF not an issue 4 Anemia 5 Debill P ivf, conservative tx   LOS: 0 days   Eudelia Hiltunen L 03/12/2017,8:46 AM

## 2017-03-12 NOTE — Progress Notes (Signed)
PROGRESS NOTE    Casey Grimes  ZOX:[id number] DOB: Nov 21, 1928 DOA: 03/10/2017 PCP: Myrlene Broker, MD (Confirm with patient/family/NH records and if not entered, this HAS to be entered at [facility] point of entry. "No PCP" if truly none.)   Brief Narrative: (Start on day 1 of progress note - keep it brief and live) 83 y.o. male with medical history significant of CKD V, HFpEF, type 2 diabetes, stroke, hypertension, dementia presenting with worsening kidney function which appears to have been due to dehydration. Patient was started on gentle hydration and had his electrolytes gently repleted and his diuretics were held.   Assessment & Plan:   Principal Problem:   AKI (acute kidney injury) (HCC) Active Problems:   Diabetes mellitus with diabetic nephropathy (HCC)   Gout   Uncontrolled hypertension   Weakness   History of CVA (cerebrovascular accident)   Uremia   Hypokalemia   Hyponatremia   AKI on CKD V: Appears due to dehydration.  baseline Cr around 3.2-3.4, downtrending with fluids.  Peaked at 5.34.  He generally appears euvolemic to hypovolemic.  No lower extremity edema or sacral edema.  - strict I/O, foley placed - Feurea prerenal - renal US with chronic medical renal dz - nephrology following, appreciate recs - calcitriol - daily renal panel - telemetry (sinus with PVCs) - CXR with atelectasis vs mild basilar interstital edema, my lung exam clear ctm  Poor appetite and weight loss: may be due to dementia and worsening renal function.  Will have nutrition see him.  Last colonoscopy was in 2004 without polyps seen.  Will need follow up outpatient.  Wt Readings from Last 3 Encounters:  03/11/17 77.6 kg (171 lb)  03/10/17 78.9 kg (174 lb)  11/17/16 90.3 kg (199 lb)    Hypokalemia:  - gentle repletion with AKI on CKD  Hyponatremia:  - likely hypovolemic, improved with IVF  Anemia - ctm, 10.8 today  T2DM:  - decrease lantus to 10 units from 16 given AKI - hold  mealtime and use SSI for now  Hx CVA - continue ASA, statin   Dementia - delirium precautions  HTN: - continue amlodipine, carvedilol, clonidine, hydralazine  - holding torsemide and metolazone  Gout: - continue allopurinol and doxycycline  Prolonged QTc: ctm Repeat EKG, improved without artifact, sinus rhythm, prolonged QT, LVH   DVT prophylaxis: heparin Code Status: full  Family Communication: neprhology Disposition Plan: home tomorrow   Consultants:   nephrology  Procedures: (Don't include imaging studies which can be auto populated. Include things that cannot be auto populated i.e. Echo, Carotid and venous dopplers, Foley, Bipap, HD, tubes/drains, wound vac, central lines etc)  Renal US pending  Antimicrobials: (specify start and planned stop date. Auto populated tables are space occupying and do not give end dates)  none    Subjective: No CP or SOB, Casey Grimes&Ox1. Wants to go home.   Objective: Vitals:   03/11/17 2100 03/12/17 0454 03/12/17 0802 03/12/17 1533  BP: (!) 144/60 (!) 139/52 (!) 145/54 (!) 143/57  Pulse: 70 73 70 (!) 57  Resp: 18 17 18 18   Temp: 98.5 F (36.9 C) 98.5 F (36.9 C) 98.4 F (36.9 C) 98.8 F (37.1 C)  TempSrc: Oral Oral Oral Oral  SpO2: 99% 100% 95% 98%  Weight: 77.6 kg (171 lb)     Height:        Intake/Output Summary (Last 24 hours) at 03/12/17 2027 Last data filed at 03/12/17 1900  Gross per 24 hour  Intake  1514.17 ml  Output             1600 ml  Net           -85.83 ml   Filed Weights   03/10/17 2125 03/11/17 2100  Weight: 78.8 kg (173 lb 11.6 oz) 77.6 kg (171 lb)    Examination:  General: No acute distress. Cardiovascular: Heart sounds show Casey Grimes regular rate, and rhythm. No gallops or rubs. No murmurs. No JVD. Lungs: Clear to auscultation bilaterally with good air movement. No rales, rhonchi or wheezes. Abdomen: Soft, nontender, nondistended with normal active bowel sounds. No masses. No  hepatosplenomegaly. Neurological: Alert and oriented 1.  Cranial nerves II through XII grossly intact. Skin: Warm and dry. No rashes or lesions. Extremities: No clubbing or cyanosis. No edema. Pedal pulses 2+.   Data Reviewed: I have personally reviewed following labs and imaging studies  CBC:  Recent Labs Lab 03/10/17 1054 03/10/17 1806 03/10/17 2302 03/11/17 0303 03/12/17 0829  WBC 5.3 5.2 6.2 4.8 5.9  NEUTROABS 3.4  --  4.2  --   --   HGB 12.6* 12.2* 12.0* 10.8* 11.0*  HCT 38.1* 36.0* 35.7* 32.6* 32.9*  MCV 86.5 81.8 81.9 82.3 83.3  PLT 317.0 291 320 264 304   Basic Metabolic Panel:  Recent Labs Lab 03/10/17 1806 03/11/17 0303 03/11/17 1555 03/12/17 0829 03/12/17 1556  NA 132* 135  135 137 136 137  K 3.0* 2.9*  3.0* 3.3* 3.6 3.8  CL 93* 96*  96* 101 101 101  CO2 26 26  26 26 24 25   GLUCOSE 246* 179*  177* 190* 256* 192*  BUN 146* 139*  141* 137* 129* 125*  CREATININE 5.34* 5.00*  4.93* 4.72* 4.29* 4.37*  CALCIUM 10.0 9.7  9.7 9.6 9.7 10.0  MG 2.6* 2.4  --   --   --   PHOS 4.2 3.6 3.7 3.2 3.4   GFR: Estimated Creatinine Clearance: 12.9 mL/min (Casey Grimes) (by C-G formula based on SCr of 4.37 mg/dL (H)). Liver Function Tests:  Recent Labs Lab 03/10/17 1806 03/11/17 0303 03/11/17 1555 03/12/17 0829 03/12/17 1556  AST 17 16  --   --   --   ALT 12* 11*  --   --   --   ALKPHOS 54 43  --   --   --   BILITOT 0.5 0.6  --   --   --   PROT 8.2* 7.4  --   --   --   ALBUMIN 3.6 3.2*  3.1* 3.2* 3.1* 3.5   No results for input(s): LIPASE, AMYLASE in the last 168 hours. No results for input(s): AMMONIA in the last 168 hours. Coagulation Profile: No results for input(s): INR, PROTIME in the last 168 hours. Cardiac Enzymes: No results for input(s): CKTOTAL, CKMB, CKMBINDEX, TROPONINI in the last 168 hours. BNP (last 3 results) No results for input(s): PROBNP in the last 8760 hours. HbA1C:  Recent Labs  03/10/17 1054  HGBA1C 7.7*   CBG:  Recent  Labs Lab 03/11/17 1229 03/11/17 2128 03/12/17 0743 03/12/17 1202 03/12/17 1713  GLUCAP 172* 234* 247* 226* 209*   Lipid Profile: No results for input(s): CHOL, HDL, LDLCALC, TRIG, CHOLHDL, LDLDIRECT in the last 72 hours. Thyroid Function Tests:  Recent Labs  03/10/17 1054  TSH 5.92*   Anemia Panel: No results for input(s): VITAMINB12, FOLATE, FERRITIN, TIBC, IRON, RETICCTPCT in the last 72 hours. Sepsis Labs: No results for input(s): PROCALCITON, LATICACIDVEN in the last 168 hours.  No results found for this or any previous visit (from the past 240 hour(s)).       Radiology Studies: US Renal  Result Date: 03/11/2017 CLINICAL DATA:  Acute kidney injury. History of chronic kidney disease. EXAM: RENAL / URINARY TRACT ULTRASOUND COMPLETE COMPARISON:  None. FINDINGS: Right Kidney: Length: 10.7 cm. Diffuse increase in renal parenchymal echogenicity. Cyst in the mid kidney measures 9 mm. No solid mass or hydronephrosis visualized. Left Kidney: Length: 9.0 cm. Diffuse increase in renal parenchymal echogenicity. No mass or hydronephrosis visualized. Bladder: Decompressed by Foley catheter not well evaluated. IMPRESSION: Increased renal parenchymal echogenicity consistent with chronic medical renal disease. No obstructive uropathy. Electronically Signed   By: Rubye Oaks M.D.   On: 03/11/2017 21:06        Scheduled Meds: . allopurinol  150 mg Oral Daily  . amLODipine  10 mg Oral Daily  . aspirin EC  325 mg Oral Daily  . atorvastatin  10 mg Oral q1800  . calcitRIOL  0.25 mcg Oral Daily  . carvedilol  25 mg Oral BID  . cloNIDine  0.1 mg Oral BID  . doxycycline  50 mg Oral BID  . feeding supplement (ENSURE ENLIVE)  237 mL Oral BID BM  . heparin  5,000 Units Subcutaneous Q8H  . hydrALAZINE  25 mg Oral TID  . insulin aspart  0-9 Units Subcutaneous TID WC  . insulin glargine  10 Units Subcutaneous QHS  . tobramycin  1 drop Left Eye Q6H   Continuous Infusions:    LOS: 0  days    Time spent: 90    Casey Muecke Grier Mitts, MD [facility] [phone] If 7PM-7AM, please contact night-coverage [email or web address] Password [facility] 03/12/2017, 8:27 PM

## 2017-03-12 NOTE — Evaluation (Signed)
Physical Therapy Evaluation Patient Details Name: Casey Grimes MRN: [id number] DOB: [birth year] Today's Date: 03/12/2017   History of Present Illness  Patient is an 83 yo male admitted 03/10/17 with progressive weakness and weight loss/decreased appetite.  Patient with dehydration, AKI.     PMH:  CKD, CHF, DM, CVA, HTN, HLD, dementia, chronic back pain, back surgery, peripheral neuropathy.      Clinical Impression  Patient presents with problems listed below.  Will benefit from acute PT to maximize functional mobility prior to discharge.  Per chart and son (by phone), patient has been declining functionally (requiring assist for mobility, gait, and ADL's), and has loss of appetite with decreased weight.  Patient with general weakness impacting mobility and gait.  Recommend patient discharge to SNF for continued therapy prior to returning home with wife.    Follow Up Recommendations SNF;Supervision/Assistance - 24 hour    Equipment Recommendations  None recommended by PT    Recommendations for Other Services       Precautions / Restrictions Precautions Precautions: Fall Restrictions Weight Bearing Restrictions: No      Mobility  Bed Mobility               General bed mobility comments: Patient in recliner as PT entered room.  Transfers Overall transfer level: Needs assistance Equipment used: Rolling walker (2 wheeled) Transfers: Sit to/from Stand Sit to Stand: Mod assist         General transfer comment: Verbal cues for hand placement.  Mod assist to move to standing from lower chair.  Assist for balance initially.  Assist to control descent into chair.  Ambulation/Gait Ambulation/Gait assistance: Min assist;Mod assist;+2 safety/equipment Ambulation Distance (Feet): 10 Feet Assistive device: Rolling walker (2 wheeled) Gait Pattern/deviations: Step-through pattern;Decreased step length - right;Decreased step length - left;Decreased stride length;Shuffle;Trunk  flexed Gait velocity: decreased Gait velocity interpretation: Below normal speed for age/gender General Gait Details: Patient with slow, unsteady gait with shuffling steps.  Assist for balance.   Stairs            Wheelchair Mobility    Modified Rankin (Stroke Patients Only)       Balance Overall balance assessment: Needs assistance         Standing balance support: Bilateral upper extremity supported Standing balance-Leahy Scale: Poor                               Pertinent Vitals/Pain Pain Assessment: No/denies pain    Home Living Family/patient expects to be discharged to:: Private residence Living Arrangements: Spouse/significant other;Children Available Help at Discharge: Family;Available 24 hours/day (spouse 24 hours) Type of Home: House Home Access: Stairs to enter Entrance Stairs-Rails: Right;Left;Can reach both Entrance Stairs-Number of Steps: 6 in front Home Layout: One level Home Equipment: Walker - 2 wheels;Shower seat;Bedside commode;Wheelchair - manual Additional Comments: Information from son    Prior Function Level of Independence: Needs assistance   Gait / Transfers Assistance Needed: Walks with RW.  Most recently, requires assist with RW, and primarily uses w/c.  ADL's / Homemaking Assistance Needed: Assist with all ADL's, meal prep, housekeeping, laundry  Comments: Information from son     Hand Dominance   Dominant Hand: Right    Extremity/Trunk Assessment   Upper Extremity Assessment Upper Extremity Assessment: Generalized weakness    Lower Extremity Assessment Lower Extremity Assessment: Generalized weakness (h/o peripheral neuropathy)       Communication   Communication: HOH (One-word  answers to questions.)  Cognition Arousal/Alertness: Awake/alert Behavior During Therapy: Flat affect Overall Cognitive Status: No family/caregiver present to determine baseline cognitive functioning                                  General Comments: Patient unable to answer questions regarding home and PLOF.  Can state his name and his wife's name.  Oriented to self and "hospital" only.  Requires repeated commands for tasks.      General Comments      Exercises     Assessment/Plan    PT Assessment Patient needs continued PT services  PT Problem List Decreased strength;Decreased activity tolerance;Decreased balance;Decreased mobility;Decreased coordination;Decreased cognition;Decreased knowledge of use of DME;Decreased safety awareness;Impaired sensation       PT Treatment Interventions DME instruction;Gait training;Functional mobility training;Therapeutic activities;Therapeutic exercise;Cognitive remediation;Patient/family education    PT Goals (Current goals can be found in the Care Plan section)  Acute Rehab PT Goals Patient Stated Goal: None stated PT Goal Formulation: Patient unable to participate in goal setting Time For Goal Achievement: 03/19/17 Potential to Achieve Goals: Good    Frequency Min 2X/week   Barriers to discharge Decreased caregiver support;Inaccessible home environment Do not feel wife can provide assist required by patient at this time.   Six stairs to get into home.    Co-evaluation               AM-PAC PT "6 Clicks" Daily Activity  Outcome Measure Difficulty turning over in bed (including adjusting bedclothes, sheets and blankets)?: A Little Difficulty moving from lying on back to sitting on the side of the bed? : Unable Difficulty sitting down on and standing up from a chair with arms (e.g., wheelchair, bedside commode, etc,.)?: Unable Help needed moving to and from a bed to chair (including a wheelchair)?: A Lot Help needed walking in hospital room?: A Lot Help needed climbing 3-5 steps with a railing? : A Lot 6 Click Score: 11    End of Session Equipment Utilized During Treatment: Gait belt Activity Tolerance: Patient limited by fatigue Patient  left: in chair;with call bell/phone within reach;with chair alarm set Nurse Communication: Mobility status PT Visit Diagnosis: Unsteadiness on feet (R26.81);Other abnormalities of gait and mobility (R26.89);Muscle weakness (generalized) (M62.81)    Time: 1610-9604 PT Time Calculation (min) (ACUTE ONLY): 15 min   Charges:   PT Evaluation $PT Eval Moderate Complexity: 1 Mod     PT G Codes:   PT G-Codes **NOT FOR INPATIENT CLASS** Functional Assessment Tool Used: AM-PAC 6 Clicks Basic Mobility Functional Limitation: Mobility: Walking and moving around Mobility: Walking and Moving Around Current Status (V4098): At least 60 percent but less than 80 percent impaired, limited or restricted Mobility: Walking and Moving Around Goal Status [phone]): At least 20 percent but less than 40 percent impaired, limited or restricted    Casey Grimes, [facility] Acute Rehab Services Pager [phone]   Vena Austria 03/12/2017, 5:55 PM

## 2017-03-12 NOTE — Progress Notes (Signed)
Nutrition Consult, Brief Note  Received MD Consult for assessment of nutrition requirements/status. RD completed nutrition assessment yesterday. Please see initial nutrition assessment in progress notes dated 8/24 for details. Supplements have been ordered. RD to continue to monitor and assist with maximizing intake while hospitalized.   Joaquin Courts, RD, LDN, CNSC Pager [phone] After Hours Pager [phone]

## 2017-03-13 LAB — GLUCOSE, CAPILLARY
GLUCOSE-CAPILLARY: 237 mg/dL — AB (ref 65–99)
GLUCOSE-CAPILLARY: 191 mg/dL — AB (ref 65–99)
GLUCOSE-CAPILLARY: 268 mg/dL — AB (ref 65–99)
GLUCOSE-CAPILLARY: 252 mg/dL — AB (ref 65–99)

## 2017-03-13 LAB — RENAL FUNCTION PANEL
ANION GAP: 9 (ref 5–15)
Albumin: 3.3 g/dL — ABNORMAL LOW (ref 3.5–5.0)
BUN: 119 mg/dL — ABNORMAL HIGH (ref 6–20)
CHLORIDE: 101 mmol/L (ref 101–111)
CO2: 26 mmol/L (ref 22–32)
Calcium: 9.9 mg/dL (ref 8.9–10.3)
Creatinine, Ser: 3.9 mg/dL — ABNORMAL HIGH (ref 0.61–1.24)
GFR calc non Af Amer: 13 mL/min — ABNORMAL LOW (ref >60)
GFR, EST AFRICAN AMERICAN: 15 mL/min — AB (ref >60)
Glucose, Bld: 261 mg/dL — ABNORMAL HIGH (ref 65–99)
POTASSIUM: 3.6 mmol/L (ref 3.5–5.1)
Phosphorus: 2.8 mg/dL (ref 2.5–4.6)
Sodium: 136 mmol/L (ref 135–145)

## 2017-03-13 MED ORDER — INSULIN GLARGINE 100 UNIT/ML ~~LOC~~ SOLN
16.0000 [IU] | Freq: Every day | SUBCUTANEOUS | Status: DC
  Administered 2017-03-13: 16 [IU] via SUBCUTANEOUS
  Filled 2017-03-13 (×2): qty 0.16

## 2017-03-13 MED ORDER — SODIUM CHLORIDE 0.9 % IV SOLN
INTRAVENOUS | Status: AC
  Administered 2017-03-13 (×2): via INTRAVENOUS
  Administered 2017-03-14: 950 mL via INTRAVENOUS

## 2017-03-13 NOTE — Progress Notes (Signed)
PROGRESS NOTE    Casey Grimes  RUE:[id number] DOB: 05-18-1929 DOA: 03/10/2017 PCP: Myrlene Broker, MD (Confirm with patient/family/NH records and if not entered, this HAS to be entered at [facility] point of entry. "No PCP" if truly none.)   Brief Narrative: (Start on day 1 of progress note - keep it brief and live) 83 y.o. male with medical history significant of CKD V, HFpEF, type 2 diabetes, stroke, hypertension, dementia presenting with worsening kidney function which appears to have been due to dehydration. Patient was started on gentle hydration and had his electrolytes gently repleted and his diuretics were held with gradual improvement in his kidney function.  He continues have little appetite.   Assessment & Plan:   Principal Problem:   AKI (acute kidney injury) (HCC) Active Problems:   Diabetes mellitus with diabetic nephropathy (HCC)   Gout   Uncontrolled hypertension   Weakness   History of CVA (cerebrovascular accident)   Uremia   Hypokalemia   Hyponatremia   AKI on CKD V: Appears due to dehydration.  Baseline Cr around 3.2-3.4, downtrending with fluids.  Peaked at 5.34.  He generally appears euvolemic to hypovolemic.  No lower extremity edema or sacral edema.  Per nephrology, not Casey Grimes good long term dialysis candidate due to his dementia and advanced age.  He also wouldn't want dialysis per his wife.  PT recommended SNF which his wife is interested in.  Would be worth discussing palliative care at some point and likely also need to readdress code status.     [ ]  Cr pending for today - strict I/O (about 500 cc over last 24 hours) - will remove foley - Feurea prerenal - renal US with chronic medical renal dz - nephrology following, appreciate recs - calcitriol - daily renal panel - telemetry (sinus with PVCs) - CXR with atelectasis vs mild basilar interstital edema, my lung exam clear ctm  Poor appetite and weight loss: Likely due to worsening renal function and  dementia. Will change diet to regular to try to encourage PO.  They added ensure enlive PO BID.  Last colonoscopy was in 2004 without polyps seen.  Will need follow up outpatient regarding his weight loss.    Wt Readings from Last 3 Encounters:  03/12/17 75.3 kg (166 lb)  03/10/17 78.9 kg (174 lb)  11/17/16 90.3 kg (199 lb)    Hypokalemia:  - gentle repletion with AKI on CKD  Hyponatremia:  - likely hypovolemic, improved with IVF  Anemia - ctm, 10.8 today  T2DM:  - Will increase back to home 16 units with elevated BG this AM  - hold mealtime and use SSI for now  Hx CVA - continue ASA, statin   Dementia - delirium precautions  HTN: - continue amlodipine, carvedilol, clonidine, hydralazine  - holding torsemide and metolazone  Gout: - continue allopurinol and doxycycline  Prolonged QTc: ctm, 475 Repeat EKG, improved without artifact, sinus rhythm, prolonged QT, LVH   DVT prophylaxis: heparin Code Status: full  Family Communication: neprhology Disposition Plan: SNF   Consultants:   nephrology  Procedures: (Don't include imaging studies which can be auto populated. Include things that cannot be auto populated i.e. Echo, Carotid and venous dopplers, Foley, Bipap, HD, tubes/drains, wound vac, central lines etc)  Renal US as above  Antimicrobials: (specify start and planned stop date. Auto populated tables are space occupying and do not give end dates)  none    Subjective: Casey Grimes.  No complaints this morning.  No CP,  SOB.    Objective: Vitals:   03/12/17 1533 03/12/17 2145 03/13/17 0545 03/13/17 0821  BP: (!) 143/57 (!) 134/42 (!) 136/55 (!) 142/65  Pulse: (!) 57 73 73 72  Resp: 18 16 16 17   Temp: 98.8 F (37.1 C) 97.7 F (36.5 C) 99.3 F (37.4 C) 99.1 F (37.3 C)  TempSrc: Oral Oral Oral Oral  SpO2: 98% 100% 97% 98%  Weight:  75.3 kg (166 lb)    Height:        Intake/Output Summary (Last 24 hours) at 03/13/17 1550 Last data filed at 03/13/17  1510  Gross per 24 hour  Intake              600 ml  Output              525 ml  Net               75 ml   Filed Weights   03/10/17 2125 03/11/17 2100 03/12/17 2145  Weight: 78.8 kg (173 lb 11.6 oz) 77.6 kg (171 lb) 75.3 kg (166 lb)    Examination:  General: No acute distress. Cardiovascular: Heart sounds show Casey Grimes regular rate, and rhythm. No gallops or rubs. No murmurs. No JVD. Lungs: Clear to auscultation bilaterally with good air movement. No rales, rhonchi or wheezes. Abdomen: Soft, nontender, nondistended with normal active bowel sounds. No masses. No hepatosplenomegaly. Neurological: Alert and oriented 2. Cranial nerves II through XII grossly intact. Skin: Warm and dry. No rashes or lesions. Extremities: No clubbing or cyanosis. No edema. Pedal pulses 2+. Psychiatric: pleasantly demented  Data Reviewed: I have personally reviewed following labs and imaging studies  CBC:  Recent Labs Lab 03/10/17 1054 03/10/17 1806 03/10/17 2302 03/11/17 0303 03/12/17 0829  WBC 5.3 5.2 6.2 4.8 5.9  NEUTROABS 3.4  --  4.2  --   --   HGB 12.6* 12.2* 12.0* 10.8* 11.0*  HCT 38.1* 36.0* 35.7* 32.6* 32.9*  MCV 86.5 81.8 81.9 82.3 83.3  PLT 317.0 291 320 264 304   Basic Metabolic Panel:  Recent Labs Lab 03/10/17 1806 03/11/17 0303 03/11/17 1555 03/12/17 0829 03/12/17 1556  NA 132* 135  135 137 136 137  K 3.0* 2.9*  3.0* 3.3* 3.6 3.8  CL 93* 96*  96* 101 101 101  CO2 26 26  26 26 24 25   GLUCOSE 246* 179*  177* 190* 256* 192*  BUN 146* 139*  141* 137* 129* 125*  CREATININE 5.34* 5.00*  4.93* 4.72* 4.29* 4.37*  CALCIUM 10.0 9.7  9.7 9.6 9.7 10.0  MG 2.6* 2.4  --   --   --   PHOS 4.2 3.6 3.7 3.2 3.4   GFR: Estimated Creatinine Clearance: 12.9 mL/min (Deklynn Grimes) (by C-G formula based on SCr of 4.37 mg/dL (H)). Liver Function Tests:  Recent Labs Lab 03/10/17 1806 03/11/17 0303 03/11/17 1555 03/12/17 0829 03/12/17 1556  AST 17 16  --   --   --   ALT 12* 11*  --   --   --    ALKPHOS 54 43  --   --   --   BILITOT 0.5 0.6  --   --   --   PROT 8.2* 7.4  --   --   --   ALBUMIN 3.6 3.2*  3.1* 3.2* 3.1* 3.5   No results for input(s): LIPASE, AMYLASE in the last 168 hours. No results for input(s): AMMONIA in the last 168 hours. Coagulation Profile: No results for input(s):  INR, PROTIME in the last 168 hours. Cardiac Enzymes: No results for input(s): CKTOTAL, CKMB, CKMBINDEX, TROPONINI in the last 168 hours. BNP (last 3 results) No results for input(s): PROBNP in the last 8760 hours. HbA1C: No results for input(s): HGBA1C in the last 72 hours. CBG:  Recent Labs Lab 03/12/17 1202 03/12/17 1713 03/12/17 2121 03/13/17 0806 03/13/17 1221  GLUCAP 226* 209* 290* 237* 191*   Lipid Profile: No results for input(s): CHOL, HDL, LDLCALC, TRIG, CHOLHDL, LDLDIRECT in the last 72 hours. Thyroid Function Tests: No results for input(s): TSH, T4TOTAL, FREET4, T3FREE, THYROIDAB in the last 72 hours. Anemia Panel: No results for input(s): VITAMINB12, FOLATE, FERRITIN, TIBC, IRON, RETICCTPCT in the last 72 hours. Sepsis Labs: No results for input(s): PROCALCITON, LATICACIDVEN in the last 168 hours.  No results found for this or any previous visit (from the past 240 hour(s)).       Radiology Studies: US Renal  Result Date: 03/11/2017 CLINICAL DATA:  Acute kidney injury. History of chronic kidney disease. EXAM: RENAL / URINARY TRACT ULTRASOUND COMPLETE COMPARISON:  None. FINDINGS: Right Kidney: Length: 10.7 cm. Diffuse increase in renal parenchymal echogenicity. Cyst in the mid kidney measures 9 mm. No solid mass or hydronephrosis visualized. Left Kidney: Length: 9.0 cm. Diffuse increase in renal parenchymal echogenicity. No mass or hydronephrosis visualized. Bladder: Decompressed by Foley catheter not well evaluated. IMPRESSION: Increased renal parenchymal echogenicity consistent with chronic medical renal disease. No obstructive uropathy. Electronically Signed   By:  Rubye Oaks M.D.   On: 03/11/2017 21:06        Scheduled Meds: . allopurinol  150 mg Oral Daily  . amLODipine  10 mg Oral Daily  . aspirin EC  325 mg Oral Daily  . atorvastatin  10 mg Oral q1800  . calcitRIOL  0.25 mcg Oral Daily  . carvedilol  25 mg Oral BID  . doxycycline  50 mg Oral BID  . feeding supplement (ENSURE ENLIVE)  237 mL Oral BID BM  . heparin  5,000 Units Subcutaneous Q8H  . hydrALAZINE  25 mg Oral TID  . insulin aspart  0-9 Units Subcutaneous TID WC  . insulin glargine  10 Units Subcutaneous QHS  . tobramycin  1 drop Left Eye Q6H   Continuous Infusions: . sodium chloride 75 mL/hr at 03/13/17 1408     LOS: 1 day    Time spent: 70    Ciel Yanes Grier Mitts, MD [facility] [phone] If 7PM-7AM, please contact night-coverage [email or web address] Password [email or web address] 03/13/2017, 3:50 PM

## 2017-03-13 NOTE — Progress Notes (Signed)
Subjective: Interval History: has no complaint,not hungry.  Objective: Vital signs in last 24 hours: Temp:  [97.7 F (36.5 C)-99.3 F (37.4 C)] 99.3 F (37.4 C) (08/26 0545) Pulse Rate:  [57-73] 73 (08/26 0545) Resp:  [16-18] 16 (08/26 0545) BP: (134-143)/(42-57) 136/55 (08/26 0545) SpO2:  [97 %-100 %] 97 % (08/26 0545) Weight:  [75.3 kg (166 lb)] 75.3 kg (166 lb) (08/25 2145) Weight change: -2.268 kg (-5 lb)  Intake/Output from previous day: 08/25 0701 - 08/26 0700 In: 1574.2 [P.O.:1140; I.V.:434.2] Out: 525 [Urine:525] Intake/Output this shift: No intake/output data recorded.  General appearance: cooperative, slowed mentation and Ox2 Resp: few bibasilar rhonchi Cardio: regular rate and rhythm and systolic murmur: systolic ejection 2/6, decrescendo at 2nd left intercostal space GI: pos bs, soft Extremities: extremities normal, atraumatic, no cyanosis or edema  Lab Results:  Recent Labs  03/11/17 0303 03/12/17 0829  WBC 4.8 5.9  HGB 10.8* 11.0*  HCT 32.6* 32.9*  PLT 264 304   BMET:  Recent Labs  03/12/17 0829 03/12/17 1556  NA 136 137  K 3.6 3.8  CL 101 101  CO2 24 25  GLUCOSE 256* 192*  BUN 129* 125*  CREATININE 4.29* 4.37*  CALCIUM 9.7 10.0   No results for input(s): PTH in the last 72 hours. Iron Studies: No results for input(s): IRON, TIBC, TRANSFERRIN, FERRITIN in the last 72 hours.  Studies/Results: US Renal  Result Date: 03/11/2017 CLINICAL DATA:  Acute kidney injury. History of chronic kidney disease. EXAM: RENAL / URINARY TRACT ULTRASOUND COMPLETE COMPARISON:  None. FINDINGS: Right Kidney: Length: 10.7 cm. Diffuse increase in renal parenchymal echogenicity. Cyst in the mid kidney measures 9 mm. No solid mass or hydronephrosis visualized. Left Kidney: Length: 9.0 cm. Diffuse increase in renal parenchymal echogenicity. No mass or hydronephrosis visualized. Bladder: Decompressed by Foley catheter not well evaluated. IMPRESSION: Increased renal  parenchymal echogenicity consistent with chronic medical renal disease. No obstructive uropathy. Electronically Signed   By: Rubye Oaks M.D.   On: 03/11/2017 21:06    I have reviewed the patient's current medications.  Assessment/Plan: 1 CKD5  Uremia. Azotemia , not Hd candidate. Not sure why fluids stopped. Would not restart diuretics 2 Dementia 3 Anemia 4 HPTH 5 Gout P encourage po fluids, ok to D/c    LOS: 1 day   Casey Grimes L 03/13/2017,8:07 AM

## 2017-03-14 ENCOUNTER — Encounter (HOSPITAL_COMMUNITY): Payer: Self-pay | Admitting: *Deleted

## 2017-03-14 LAB — RENAL FUNCTION PANEL
ALBUMIN: 3.2 g/dL — AB (ref 3.5–5.0)
Anion gap: 10 (ref 5–15)
BUN: 100 mg/dL — AB (ref 6–20)
CALCIUM: 10 mg/dL (ref 8.9–10.3)
CO2: 25 mmol/L (ref 22–32)
CREATININE: 3.41 mg/dL — AB (ref 0.61–1.24)
Chloride: 108 mmol/L (ref 101–111)
GFR calc Af Amer: 17 mL/min — ABNORMAL LOW (ref >60)
GFR calc non Af Amer: 15 mL/min — ABNORMAL LOW (ref >60)
GLUCOSE: 192 mg/dL — AB (ref 65–99)
PHOSPHORUS: 2.3 mg/dL — AB (ref 2.5–4.6)
Potassium: 3.8 mmol/L (ref 3.5–5.1)
SODIUM: 143 mmol/L (ref 135–145)

## 2017-03-14 LAB — GLUCOSE, CAPILLARY
Glucose-Capillary: 167 mg/dL — ABNORMAL HIGH (ref 65–99)
Glucose-Capillary: 169 mg/dL — ABNORMAL HIGH (ref 65–99)
Glucose-Capillary: 184 mg/dL — ABNORMAL HIGH (ref 65–99)
Glucose-Capillary: 184 mg/dL — ABNORMAL HIGH (ref 65–99)

## 2017-03-14 MED ORDER — ENSURE ENLIVE PO LIQD: 237.0000 mL | Freq: Two times a day (BID) | ORAL | 12 refills | Status: DC

## 2017-03-14 MED ORDER — SODIUM CHLORIDE 0.9 % IV BOLUS (SEPSIS)
500.0000 mL | Freq: Once | INTRAVENOUS | Status: AC
  Administered 2017-03-14: 500 mL via INTRAVENOUS

## 2017-03-14 NOTE — NC FL2 (Signed)
[place] MEDICAID FL2 LEVEL OF CARE SCREENING TOOL     IDENTIFICATION  Patient Name: Casey Grimes Birthdate: Jan 22, 1929 Sex: male Admission Date (Current Location): 03/10/2017  [facility] and [place] Number:  Producer, television/film/video and Address:  [facility] [place], [address] [place], [place] [address]      Provider Number: [id number]  Attending Physician Name and Address:  Nancy Nordmann., MD  Relative Name and Phone Number:       Current Level of Care: Hospital Recommended Level of Care: Skilled Nursing Facility Prior Approval Number:    Date Approved/Denied:   PASRR Number: [id number]  Discharge Plan: SNF    Current Diagnoses: Patient Active Problem List   Diagnosis Date Noted  . Uremia 03/10/2017  . Hypokalemia 03/10/2017  . Hyponatremia 03/10/2017  . Acute on chronic diastolic heart failure (HCC) 11/09/2016  . Dementia 11/09/2016  . Diabetes mellitus with complication (HCC)   . Hearing loss due to cerumen impaction, right 10/12/2016  . CKD (chronic kidney disease) 04/29/2015  . Myoclonus 11/24/2014  . AKI (acute kidney injury) (HCC) 11/22/2014  . Hyperlipidemia 09/18/2014  . History of CVA (cerebrovascular accident) 09/18/2014  . Weakness 07/16/2014  . Chronic diastolic heart failure (HCC) 07/31/2013  . Acute respiratory failure with hypoxia (HCC) 07/23/2013  . Anemia due to chronic kidney disease 07/23/2013  . Diabetic peripheral neuropathy (HCC) 06/08/2013  . Routine health maintenance 05/04/2012  . Diabetes mellitus with diabetic nephropathy (HCC) 12/21/2008  . Gout 12/20/2008  . Uncontrolled hypertension 01/28/2007    Orientation RESPIRATION BLADDER Height & Weight     Self  Normal Incontinent, External catheter Weight: 183 lb (83 kg) Height:  5\' 11"  (180.3 cm)  BEHAVIORAL SYMPTOMS/MOOD NEUROLOGICAL BOWEL NUTRITION STATUS      Continent Diet (see DC summary)  AMBULATORY STATUS COMMUNICATION OF NEEDS Skin   Limited Assist  Verbally Normal                       Personal Care Assistance Level of Assistance  Bathing, Dressing Bathing Assistance: Limited assistance   Dressing Assistance: Limited assistance     Functional Limitations Info             SPECIAL CARE FACTORS FREQUENCY  PT (By licensed PT), OT (By licensed OT)     PT Frequency: 5/wk OT Frequency: 5/wk            Contractures      Additional Factors Info  Code Status, Allergies, Insulin Sliding Scale Code Status Info: FULL Allergies Info: NKA   Insulin Sliding Scale Info: 4/day       Current Medications (03/14/2017):  This is the current hospital active medication list Current Facility-Administered Medications  Medication Dose Route Frequency Provider Last Rate Last Dose  . 0.9 %  sodium chloride infusion   Intravenous Continuous Nancy Nordmann., MD 75 mL/hr at 03/13/17 2232    . allopurinol (ZYLOPRIM) tablet 150 mg  150 mg Oral Daily Nancy Nordmann., MD   150 mg at 03/13/17 1049  . amLODipine (NORVASC) tablet 10 mg  10 mg Oral Daily Nancy Nordmann., MD   10 mg at 03/13/17 1050  . aspirin EC tablet 325 mg  325 mg Oral Daily Nancy Nordmann., MD   325 mg at 03/13/17 0800  . atorvastatin (LIPITOR) tablet 10 mg  10 mg Oral q1800 Nancy Nordmann., MD   10 mg at  03/13/17 1736  . calcitRIOL (ROCALTROL) capsule 0.25 mcg  0.25 mcg Oral Daily Nancy Nordmann., MD   0.25 mcg at 03/13/17 1051  . carvedilol (COREG) tablet 25 mg  25 mg Oral BID Nancy Nordmann., MD   25 mg at 03/13/17 2230  . doxycycline (VIBRAMYCIN) 50 MG capsule 50 mg  50 mg Oral BID Nancy Nordmann., MD   50 mg at 03/13/17 2228  . feeding supplement (ENSURE ENLIVE) (ENSURE ENLIVE) liquid 237 mL  237 mL Oral BID BM Lauris Poag, MD   237 mL at 03/13/17 1456  . heparin injection 5,000 Units  5,000 Units Subcutaneous Q8H Nancy Nordmann., MD   5,000 Units at 03/14/17 4034  . hydrALAZINE (APRESOLINE) tablet 25 mg  25 mg Oral TID Beryle Lathe, MD   25 mg at 03/13/17 2228  . insulin aspart (novoLOG) injection 0-9 Units  0-9 Units Subcutaneous TID WC Nancy Nordmann., MD   5 Units at 03/13/17 1758  . insulin glargine (LANTUS) injection 16 Units  16 Units Subcutaneous QHS Nancy Nordmann., MD   16 Units at 03/13/17 2229  . senna-docusate (Senokot-S) tablet 1 tablet  1 tablet Oral BID BM PRN Nancy Nordmann., MD      . tobramycin (TOBREX) 0.3 % ophthalmic solution 1 drop  1 drop Left Eye Q6H Nancy Nordmann., MD   1 drop at 03/14/17 7425     Discharge Medications: Please see discharge summary for a list of discharge medications.  Relevant Imaging Results:  Relevant Lab Results:   Additional Information SS#: [id number]  Burna Sis, LCSW

## 2017-03-14 NOTE — Progress Notes (Addendum)
CSW spoke with pt wife this morning regarding PT recommendation for SNF.  Wife believes this would be best plan for pt and states pt has been to [facility] before and that would be their preference.  No full assessment due to same day referral.  Patient will discharge to [facility] Anticipated discharge date:8/27 Family notified:wife Transportation by [facility]- called at 3:30pm  CSW signing off.  Burna Sis, LCSW Clinical Social Worker [phone]

## 2017-03-14 NOTE — Progress Notes (Signed)
Bladder scanned patient greater than 200. No urine output since foley d/c. NP made aware.

## 2017-03-14 NOTE — Care Management Note (Signed)
Case Management Note  Patient Details  Name: Casey Grimes MRN: [id number] Date of Birth: [birth year]  Subjective/Objective:      CM following for progression and d/c planning.               Action/Plan: 8/287/2018 Noted that plan now is for SNF placement, pt still not meeting criteria for inpt or obs. Will remind attending of need for d/c asap. CSW Eileen Stanford states that she has facility offers. Will continue to follow.   Expected Discharge Date:  03/16/17               Expected Discharge Plan:  Skilled Nursing Facility  In-House Referral:  Clinical Social Work  Discharge planning Services  NA  Post Acute Care Choice:  NA Choice offered to:  NA  DME Arranged:  N/A DME Agency:  NA  HH Arranged:  NA HH Agency:  NA  Status of Service:  Completed, signed off  If discussed at [employer] of Stay Meetings, dates discussed:    Additional Comments:  Starlyn Skeans, RN 03/14/2017, 10:39 AM

## 2017-03-14 NOTE — Progress Notes (Signed)
Mina Marble Grunder to be D/C'd Skilled nursing facility per MD order.  Discussed prescriptions and follow up appointments with the patient.Called report to SNF, [facility]. Sent all paperwork with pt.  Allergies as of 03/14/2017   No Known Allergies     Medication List    STOP taking these medications   HUMALOG 100 UNIT/ML injection Generic drug:  insulin lispro   metolazone 2.5 MG tablet Commonly known as:  ZAROXOLYN   torsemide 20 MG tablet Commonly known as:  DEMADEX     TAKE these medications   allopurinol 300 MG tablet Commonly known as:  ZYLOPRIM Take 150 mg by mouth daily.   amLODipine 10 MG tablet Commonly known as:  NORVASC TAKE 1 TABLET BY MOUTH EVERY DAY   aspirin EC 325 MG tablet Take 325 mg by mouth daily.   atorvastatin 10 MG tablet Commonly known as:  LIPITOR TAKE 1 TABLET (10 MG TOTAL) BY MOUTH DAILY.   calcitRIOL 0.25 MCG capsule Commonly known as:  ROCALTROL TAKE 1 CAPSULE (0.25 MCG TOTAL) BY MOUTH DAILY.   carvedilol 25 MG tablet Commonly known as:  COREG Take 1 tablet (25 mg total) by mouth 2 (two) times daily. Follow-up appt due must see provider for future refills   cloNIDine 0.2 MG tablet Commonly known as:  CATAPRES TAKE 1 TABLET TWICE A DAY   doxycycline 50 MG tablet Commonly known as:  ADOXA Take 50 mg by mouth 2 (two) times daily.   feeding supplement (ENSURE ENLIVE) Liqd Take 237 mLs by mouth 2 (two) times daily between meals.   FREESTYLE LIBRE SENSOR SYSTEM Misc 1 Units by Does not apply route 4 (four) times daily -  before meals and at bedtime. Provide 30days supplies with 3refills.   hydrALAZINE 50 MG tablet Commonly known as:  APRESOLINE TAKE 1 TABLET (50 MG TOTAL) BY MOUTH 4 (FOUR) TIMES DAILY. What changed:  Another medication with the same name was removed. Continue taking this medication, and follow the directions you see here.   insulin glargine 100 UNIT/ML injection Commonly known as:  LANTUS Inject 0.16 mLs (16 Units  total) into the skin at bedtime.   Insulin Syringe-Needle U-100 30G X 1/2" 0.5 ML Misc Commonly known as:  B-D INS SYRINGE 0.5CC/30GX1/2" USE 3 PER DAY AS DIRECTED   B-D INS SYRINGE 0.5CC/30GX1/2" 30G X 1/2" 0.5 ML Misc Generic drug:  Insulin Syringe-Needle U-100 USE 3 PER DAY AS DIRECTED   KLOR-CON M20 20 MEQ tablet Generic drug:  potassium chloride SA TAKE 1 TABLET (20 MEQ TOTAL) BY MOUTH DAILY.   ONE TOUCH ULTRA SYSTEM KIT w/Device Kit 1 kit by Does not apply route once.   ONE TOUCH ULTRA TEST test strip Generic drug:  glucose blood USE TO CHECK BLOOD SUGAR 4 TIMES DAILY. E11.21   sennosides-docusate sodium 8.6-50 MG tablet Commonly known as:  SENOKOT-S Take 1 tablet by mouth 2 (two) times daily between meals as needed for constipation.   tobramycin 0.3 % ophthalmic solution Commonly known as:  TOBREX Place 1 drop into the left eye every 6 (six) hours.            Discharge Care Instructions        Start     Ordered   03/15/17 0000  feeding supplement, ENSURE ENLIVE, (ENSURE ENLIVE) LIQD  2 times daily between meals     03/14/17 1424   03/14/17 0000  Increase activity slowly     03/14/17 1424   03/14/17 0000  Diet -  low sodium heart healthy     03/14/17 1424   03/14/17 0000  Discharge instructions    Comments:  You were seen for dehydration poor appetite and worsening renal failure.  Your labs improved with IV fluids.  Please stop your torsemide and metolazone.  Please follow up with your PCP within 1 week and please follow up with nephrology within the next few weeks.   03/14/17 1424   03/14/17 0000  Call MD for:     03/14/17 1424   03/14/17 0000  Call MD for:  severe uncontrolled pain     03/14/17 1424   03/14/17 0000  Call MD for:  difficulty breathing, headache or visual disturbances     03/14/17 1424   03/14/17 0000  Call MD for:  persistant dizziness or light-headedness     03/14/17 1424   03/14/17 0000  Call MD for:  extreme fatigue     03/14/17 1424    03/14/17 0000  Ambulatory referral to Nephrology    Comments:  Follow up within 2-4 weeks   03/14/17 1424      Vitals:   03/14/17 0700 03/14/17 0849  BP: (!) 162/65 (!) 159/60  Pulse: 84 78  Resp: 18 20  Temp: 98.4 F (36.9 C) 98.7 F (37.1 C)  SpO2: 100% 99%    Skin clean, dry and intact without evidence of skin break down, no evidence of skin tears noted. IV catheter discontinued intact. Site without signs and symptoms of complications. Dressing and pressure applied. Pt denies pain at this time. No complaints noted.  An After Visit Summary was printed and given to the patient. Patient escorted via Programme researcher, broadcasting/film/video and D/C home via [place], RN [place] Phone [phone]

## 2017-03-14 NOTE — Discharge Summary (Signed)
Physician Discharge Summary  Casey Grimes JYN:[id number] DOB: 09-Aug-1928 DOA: 03/10/2017  PCP: Casey Broker, MD  Admit date: 03/10/2017 Discharge date: 03/14/2017  Time spent: 35 minutes  Recommendations for Outpatient Follow-up:  1. Follow up BMP within next 3 days.  Consider discontinuing potassium supplementation if K above 4.   2. Holding diuretics, watch volume status.   3. Holding mealtime insulin given poor appetite.  Continue ACHS BG checks with meals and consider restarting this if he has elevated BG's throughout the day.  4. Follow up with nephrology  5. Follow up weight loss, appetite 6. Goals of care discussion.  Consider code status and discussing MOST form.  7. Continue to follow CBC for anemia 8. Continue to monitor qtc, slightly prolonged, watch QT prolonging meds  Discharge Diagnoses:  Principal Problem:   AKI (acute kidney injury) (HCC) Active Problems:   Diabetes mellitus with diabetic nephropathy (HCC)   Gout   Uncontrolled hypertension   Weakness   History of CVA (cerebrovascular accident)   Uremia   Hypokalemia   Hyponatremia   Discharge Condition: stable  Diet recommendation: heart healthy  Filed Weights   03/11/17 2100 03/12/17 2145 03/13/17 2135  Weight: 77.6 kg (171 lb) 75.3 kg (166 lb) 83 kg (183 lb)    History of present illness:  83 y.o.malewith medical history significant of CKD V, HFpEF, type 2 diabetes, stroke, hypertension, dementia presenting with worsening kidney function which appears to have been due to dehydration and poor PO intake. He had Casey Grimes UA that was negative, urine studies suggesting prerenal etiology, and renal US suggesting medical renal disease.  Patient was started on gentle hydration and had his electrolytes gently repleted and his diuretics were held with gradual improvement in his kidney function.  He was seen by nutrition who recommended liberalization of his diet as well as adding ensure enlive PO BID.  Discussion  with wife and son regarding his advanced renal disease and dementia and code status.  I recommended considering the goals of care for [name], including considering changing his code status to DNR (especially given his previous opinions on dialysis) (83 years old) as well as considering filling out Evolett Grimes MOST form given his progressive renal disease and dementia.  Discharged to SNF on 8/27.   Hospital Course:  AKI on CKD V: Appears due to dehydration.  At discharge, Cr was 3.9, BUN 119.  Baseline Cr around 3.2-3.4, downtrending with fluids.  Peaked at 5.34. He generally appears euvolemic to hypovolemic and has remained so throughout the admission.  Per nephrology, not Casey Grimes good long term dialysis candidate due to his dementia and 83 years old.  He also wouldn't want dialysis per his wife.  PT recommended SNF which his wife is interested in. Creatinine gradually improved - strict I/O (about 1000 cc over last 24 hours) - condom cath - Feurea prerenal - renal US with chronic medical renal dz - nephrology following, appreciate recs, recommend not restarting diuretics - calcitriol - daily renal panel - telemetry (sinus with PVCs) - CXR with atelectasis vs mild basilar interstital edema, my lung exam clear ctm  Poor appetite and weight loss: Likely due to worsening renal function and dementia. Will change diet to regular to try to encourage PO.  They added ensure enlive PO BID.  Last colonoscopy was in 2004 without polyps seen.  Continue to follow up outpatient regarding his weight loss and nutrition.       Wt Readings from Last 3 Encounters:  03/12/17 75.3 kg (166 lb)  03/10/17 78.9 kg (174 lb)  11/17/16 90.3 kg (199 lb)    Hypokalemia:  - gentle repletion with AKI on CKD, improved  Hyponatremia:  - likely hypovolemic, improved with IVF  Anemia- ctm, 10.8 today  T2DM:  - home 16 units lantus - would decrease mealtime insulin and hold if NPO   Hx CVA - continue ASA, statin   Dementia -  delirium precautions  HTN: - continue amlodipine, carvedilol, clonidine, hydralazine  - hold torsemide and metolazone  Gout: - continue allopurinol and doxycycline  Prolonged QTc: ctm, 475 Repeat EKG, improved without artifact, sinus rhythm, prolonged QT, LVH   Procedures:  Renal US as above   Consultations:  neprhology  Discharge Exam: Vitals:   03/14/17 0700 03/14/17 0849  BP: (!) 162/65 (!) 159/60  Pulse: 84 78  Resp: 18 20  Temp: 98.4 F (36.9 C) 98.7 F (37.1 C)  SpO2: 100% 99%    General: NAD, Casey Grimes&Ox1 Cardiovascular: RRR, no mgr Respiratory: CTAB Abd: s/nt/nd Ext: no lee   Discharge Instructions   Discharge Instructions    Ambulatory referral to Nephrology    Complete by:  As directed    Follow up within 2-4 weeks   Call MD for:    Complete by:  As directed    Call MD for:  difficulty breathing, headache or visual disturbances    Complete by:  As directed    Call MD for:  extreme fatigue    Complete by:  As directed    Call MD for:  persistant dizziness or light-headedness    Complete by:  As directed    Call MD for:  severe uncontrolled pain    Complete by:  As directed    Diet - low sodium heart healthy    Complete by:  As directed    Discharge instructions    Complete by:  As directed    You were seen for dehydration poor appetite and worsening renal failure.  Your labs improved with IV fluids.  Please stop your torsemide and metolazone.  Please follow up with your PCP within 1 week and please follow up with nephrology within the next few weeks.   Increase activity slowly    Complete by:  As directed      Current Discharge Medication List    START taking these medications   Details  feeding supplement, ENSURE ENLIVE, (ENSURE ENLIVE) LIQD Take 237 mLs by mouth 2 (two) times daily between meals. Qty: 237 mL, Refills: 12      CONTINUE these medications which have NOT CHANGED   Details  allopurinol (ZYLOPRIM) 300 MG tablet Take 150 mg  by mouth daily.      amLODipine (NORVASC) 10 MG tablet TAKE 1 TABLET BY MOUTH EVERY DAY Qty: 90 tablet, Refills: 3    aspirin EC 325 MG tablet Take 325 mg by mouth daily.    !! B-D INS SYRINGE 0.5CC/30GX1/2" 30G X 1/2" 0.5 ML MISC USE 3 PER DAY AS DIRECTED Qty: 300 each, Refills: 1    Blood Glucose Monitoring Suppl (ONE TOUCH ULTRA SYSTEM KIT) w/Device KIT 1 kit by Does not apply route once. Qty: 1 each, Refills: 0    calcitRIOL (ROCALTROL) 0.25 MCG capsule TAKE 1 CAPSULE (0.25 MCG TOTAL) BY MOUTH DAILY. Qty: 90 capsule, Refills: 3    carvedilol (COREG) 25 MG tablet Take 1 tablet (25 mg total) by mouth 2 (two) times daily. Follow-up appt due must see provider for future refills Qty: 60 tablet, Refills: 0  cloNIDine (CATAPRES) 0.2 MG tablet TAKE 1 TABLET TWICE Yanett Conkright DAY Qty: 60 tablet, Refills: 3    Continuous Blood Gluc Sensor (FREESTYLE LIBRE SENSOR SYSTEM) MISC 1 Units by Does not apply route 4 (four) times daily -  before meals and at bedtime. Provide 30days supplies with 3refills. Qty: 1 each, Refills: 0   Associated Diagnoses: Type 2 diabetes mellitus with diabetic neuropathy, with long-term current use of insulin (HCC)    doxycycline (ADOXA) 50 MG tablet Take 50 mg by mouth 2 (two) times daily.    insulin glargine (LANTUS) 100 UNIT/ML injection Inject 0.16 mLs (16 Units total) into the skin at bedtime. Qty: 10 mL, Refills: 11    !! Insulin Syringe-Needle U-100 (B-D INS SYRINGE 0.5CC/30GX1/2") 30G X 1/2" 0.5 ML MISC USE 3 PER DAY AS DIRECTED Qty: 270 each, Refills: 3    KLOR-CON M20 20 MEQ tablet TAKE 1 TABLET (20 MEQ TOTAL) BY MOUTH DAILY. Qty: 90 tablet, Refills: 0    ONE TOUCH ULTRA TEST test strip USE TO CHECK BLOOD SUGAR 4 TIMES DAILY. E11.21 Qty: 100 each, Refills: 2    sennosides-docusate sodium (SENOKOT-S) 8.6-50 MG tablet Take 1 tablet by mouth 2 (two) times daily between meals as needed for constipation.   Associated Diagnoses: Constipation, unspecified  constipation type    tobramycin (TOBREX) 0.3 % ophthalmic solution Place 1 drop into the left eye every 6 (six) hours. Qty: 5 mL, Refills: 0    atorvastatin (LIPITOR) 10 MG tablet TAKE 1 TABLET (10 MG TOTAL) BY MOUTH DAILY. Qty: 90 tablet, Refills: 3    hydrALAZINE (APRESOLINE) 50 MG tablet TAKE 1 TABLET (50 MG TOTAL) BY MOUTH 4 (FOUR) TIMES DAILY. Qty: 120 tablet, Refills: 5     !! - Potential duplicate medications found. Please discuss with provider.    STOP taking these medications     HUMALOG 100 UNIT/ML injection      metolazone (ZAROXOLYN) 2.5 MG tablet      torsemide (DEMADEX) 20 MG tablet      metolazone (ZAROXOLYN) 2.5 MG tablet        No Known Allergies Follow-up Information    Casey Broker, MD. Schedule an appointment as soon as possible for Rula [place]iston visit in 1 week(s).   Specialty:  Internal Medicine Contact information: [address] [place] [address] [phone]        Lauris Poag, MD. Schedule an appointment as soon as possible for Monik Lins visit.   Specialty:  Nephrology Contact information: [address]                          [place] [address] [phone]        Lauris Poag, MD .   Specialty:  Nephrology Contact information: [place] [phone]            The results of significant diagnostics from this hospitalization (including imaging, microbiology, ancillary and laboratory) are listed below for reference.    Significant Diagnostic Studies: Dg Chest 2 View  Result Date: 03/10/2017 CLINICAL DATA:  Weakness, renal failure, possible fluid overload. History of CHF, diabetes, CVA, renal insufficiency. Former smoker. EXAM: CHEST  2 VIEW COMPARISON:  Chest x-ray of November 10, 2016 FINDINGS: The lungs are reasonably well inflated. Patchy increased density is present at the lung bases but is not as conspicuous as on the previous study. The heart is top-normal in size. The pulmonary vascularity is normal.  There is calcification  in the wall of the thoracic aorta. The mediastinum is normal in width. The bony thorax exhibits no acute abnormality. IMPRESSION: Bibasilar atelectasis or possible or mild basilar interstitial edema. No definite cephalization of the vascular pattern however. Thoracic aortic atherosclerosis. Electronically Signed   By: David  [place] M.D.   On: 03/10/2017 15:43   US Renal  Result Date: 03/11/2017 CLINICAL DATA:  Acute kidney injury. History of chronic kidney disease. EXAM: RENAL / URINARY TRACT ULTRASOUND COMPLETE COMPARISON:  None. FINDINGS: Right Kidney: Length: 10.7 cm. Diffuse increase in renal parenchymal echogenicity. Cyst in the mid kidney measures 9 mm. No solid mass or hydronephrosis visualized. Left Kidney: Length: 9.0 cm. Diffuse increase in renal parenchymal echogenicity. No mass or hydronephrosis visualized. Bladder: Decompressed by Foley catheter not well evaluated. IMPRESSION: Increased renal parenchymal echogenicity consistent with chronic medical renal disease. No obstructive uropathy. Electronically Signed   By: Rubye Oaks M.D.   On: 03/11/2017 21:06    Microbiology: No results found for this or any previous visit (from the past 240 hour(s)).   Labs: Basic Metabolic Panel:  Recent Labs Lab 03/10/17 1806 03/11/17 0303 03/11/17 1555 03/12/17 0829 03/12/17 1556 03/13/17 1617  NA 132* 135  135 137 136 137 136  K 3.0* 2.9*  3.0* 3.3* 3.6 3.8 3.6  CL 93* 96*  96* 101 101 101 101  CO2 26 26  26 26 24 25 26   GLUCOSE 246* 179*  177* 190* 256* 192* 261*  BUN 146* 139*  141* 137* 129* 125* 119*  CREATININE 5.34* 5.00*  4.93* 4.72* 4.29* 4.37* 3.90*  CALCIUM 10.0 9.7  9.7 9.6 9.7 10.0 9.9  MG 2.6* 2.4  --   --   --   --   PHOS 4.2 3.6 3.7 3.2 3.4 2.8   Liver Function Tests:  Recent Labs Lab 03/10/17 1806 03/11/17 0303 03/11/17 1555 03/12/17 0829 03/12/17 1556 03/13/17 1617  AST 17 16  --   --   --   --   ALT 12* 11*  --   --   --    --   ALKPHOS 54 43  --   --   --   --   BILITOT 0.5 0.6  --   --   --   --   PROT 8.2* 7.4  --   --   --   --   ALBUMIN 3.6 3.2*  3.1* 3.2* 3.1* 3.5 3.3*   No results for input(s): LIPASE, AMYLASE in the last 168 hours. No results for input(s): AMMONIA in the last 168 hours. CBC:  Recent Labs Lab 03/10/17 1054 03/10/17 1806 03/10/17 2302 03/11/17 0303 03/12/17 0829  WBC 5.3 5.2 6.2 4.8 5.9  NEUTROABS 3.4  --  4.2  --   --   HGB 12.6* 12.2* 12.0* 10.8* 11.0*  HCT 38.1* 36.0* 35.7* 32.6* 32.9*  MCV 86.5 81.8 81.9 82.3 83.3  PLT 317.0 291 320 264 304   Cardiac Enzymes: No results for input(s): CKTOTAL, CKMB, CKMBINDEX, TROPONINI in the last 168 hours. BNP: BNP (last 3 results)  Recent Labs  11/09/16 0818  BNP 520.2*    ProBNP (last 3 results) No results for input(s): PROBNP in the last 8760 hours.  CBG:  Recent Labs Lab 03/13/17 2115 03/14/17 0833 03/14/17 1047 03/14/17 1208 03/14/17 1303  GLUCAP 252* 167* 169* 184* 184*       Signed:  Margarine Grosshans Grier Mitts MD.  [facility] 03/14/2017, 2:29 PM

## 2017-03-14 NOTE — Clinical Social Work Placement (Signed)
   CLINICAL SOCIAL WORK PLACEMENT  NOTE  Date:  03/14/2017  Patient Details  Name: Casey Grimes MRN: [id number] Date of Birth: 1929/04/24  Clinical Social Work is seeking post-discharge placement for this patient at the Skilled  Nursing Facility level of care (*CSW will initial, date and re-position this form in  chart as items are completed):  Yes   Patient/family provided with [place] Clinical Social Work Department's list of facilities offering this level of care within the geographic area requested by the patient (or if unable, by the patient's family).  Yes   Patient/family informed of their freedom to choose among providers that offer the needed level of care, that participate in Medicare, Medicaid or managed care program needed by the patient, have an available bed and are willing to accept the patient.  Yes   Patient/family informed of [facility]'s ownership interest in [facility] and [facility], as well as of the fact that they are under no obligation to receive care at these facilities.  PASRR submitted to EDS on       PASRR number received on       Existing PASRR number confirmed on 03/14/17     FL2 transmitted to all facilities in geographic area requested by pt/family on 03/14/17     FL2 transmitted to all facilities within larger geographic area on       Patient informed that his/her managed care company has contracts with or will negotiate with certain facilities, including the following:        Yes   Patient/family informed of bed offers received.  Patient chooses bed at [facility]     Physician recommends and patient chooses bed at      Patient to be transferred to [facility] on 03/14/17.  Patient to be transferred to facility by ptar     Patient family notified on 03/14/17 of transfer.  Name of family member notified:  Adela Lank [place]     PHYSICIAN Please sign FL2     Additional Comment:     _______________________________________________ Burna Sis, LCSW 03/14/2017, 3:07 PM

## 2017-03-16 ENCOUNTER — Telehealth: Payer: Self-pay | Admitting: *Deleted

## 2017-03-16 NOTE — Telephone Encounter (Signed)
Called pt to set-up TCM hosp appt spoke w/his wife she inform me that he was sent to a SNF for rehab and not sure how long he will be in there. Inform wife will note husband was sent to SNF, and once he has been d/c from there facility should make f/u appt.Marland Kitchen.Raechel Chute/lmb

## 2017-03-23 ENCOUNTER — Ambulatory Visit: Payer: Medicare Other | Admitting: Cardiovascular Disease

## 2017-03-23 ENCOUNTER — Other Ambulatory Visit: Payer: Medicare Other

## 2017-04-01 ENCOUNTER — Inpatient Hospital Stay (HOSPITAL_COMMUNITY)
Admission: EM | Admit: 2017-04-01 | Discharge: 2017-04-05 | DRG: 871 | Disposition: A | Discharge status: Hospice | Payer: Medicare Other | Attending: Family Medicine | Admitting: Family Medicine

## 2017-04-01 ENCOUNTER — Encounter (HOSPITAL_COMMUNITY): Payer: Self-pay | Admitting: Family Medicine

## 2017-04-01 ENCOUNTER — Emergency Department (HOSPITAL_COMMUNITY): Payer: Medicare Other

## 2017-04-01 ENCOUNTER — Other Ambulatory Visit: Payer: Self-pay | Admitting: Internal Medicine

## 2017-04-01 DIAGNOSIS — F0280 Dementia in other diseases classified elsewhere without behavioral disturbance: Secondary | ICD-10-CM | POA: Diagnosis not present

## 2017-04-01 DIAGNOSIS — N186 End stage renal disease: Secondary | ICD-10-CM | POA: Diagnosis present

## 2017-04-01 DIAGNOSIS — E1122 Type 2 diabetes mellitus with diabetic chronic kidney disease: Secondary | ICD-10-CM | POA: Diagnosis present

## 2017-04-01 DIAGNOSIS — E1169 Type 2 diabetes mellitus with other specified complication: Secondary | ICD-10-CM | POA: Diagnosis present

## 2017-04-01 DIAGNOSIS — E870 Hyperosmolality and hypernatremia: Secondary | ICD-10-CM | POA: Diagnosis not present

## 2017-04-01 DIAGNOSIS — Z7189 Other specified counseling: Secondary | ICD-10-CM | POA: Diagnosis not present

## 2017-04-01 DIAGNOSIS — Z7982 Long term (current) use of aspirin: Secondary | ICD-10-CM | POA: Diagnosis not present

## 2017-04-01 DIAGNOSIS — R4701 Aphasia: Secondary | ICD-10-CM | POA: Diagnosis present

## 2017-04-01 DIAGNOSIS — E1142 Type 2 diabetes mellitus with diabetic polyneuropathy: Secondary | ICD-10-CM | POA: Diagnosis present

## 2017-04-01 DIAGNOSIS — I959 Hypotension, unspecified: Secondary | ICD-10-CM | POA: Diagnosis present

## 2017-04-01 DIAGNOSIS — M109 Gout, unspecified: Secondary | ICD-10-CM | POA: Diagnosis present

## 2017-04-01 DIAGNOSIS — Z6822 Body mass index (BMI) 22.0-22.9, adult: Secondary | ICD-10-CM | POA: Diagnosis present

## 2017-04-01 DIAGNOSIS — G309 Alzheimer's disease, unspecified: Secondary | ICD-10-CM

## 2017-04-01 DIAGNOSIS — G8929 Other chronic pain: Secondary | ICD-10-CM | POA: Diagnosis present

## 2017-04-01 DIAGNOSIS — R64 Cachexia: Secondary | ICD-10-CM | POA: Diagnosis present

## 2017-04-01 DIAGNOSIS — E118 Type 2 diabetes mellitus with unspecified complications: Secondary | ICD-10-CM | POA: Diagnosis present

## 2017-04-01 DIAGNOSIS — N184 Chronic kidney disease, stage 4 (severe): Secondary | ICD-10-CM | POA: Diagnosis not present

## 2017-04-01 DIAGNOSIS — M545 Low back pain: Secondary | ICD-10-CM | POA: Diagnosis present

## 2017-04-01 DIAGNOSIS — D631 Anemia in chronic kidney disease: Secondary | ICD-10-CM | POA: Diagnosis present

## 2017-04-01 DIAGNOSIS — E872 Acidosis, unspecified: Secondary | ICD-10-CM

## 2017-04-01 DIAGNOSIS — Z792 Long term (current) use of antibiotics: Secondary | ICD-10-CM

## 2017-04-01 DIAGNOSIS — Z66 Do not resuscitate: Secondary | ICD-10-CM | POA: Diagnosis present

## 2017-04-01 DIAGNOSIS — R29714 NIHSS score 14: Secondary | ICD-10-CM | POA: Diagnosis present

## 2017-04-01 DIAGNOSIS — N179 Acute kidney failure, unspecified: Secondary | ICD-10-CM | POA: Diagnosis present

## 2017-04-01 DIAGNOSIS — I132 Hypertensive heart and chronic kidney disease with heart failure and with stage 5 chronic kidney disease, or end stage renal disease: Secondary | ICD-10-CM | POA: Diagnosis present

## 2017-04-01 DIAGNOSIS — Z794 Long term (current) use of insulin: Secondary | ICD-10-CM

## 2017-04-01 DIAGNOSIS — Z515 Encounter for palliative care: Secondary | ICD-10-CM | POA: Diagnosis not present

## 2017-04-01 DIAGNOSIS — H109 Unspecified conjunctivitis: Secondary | ICD-10-CM | POA: Diagnosis not present

## 2017-04-01 DIAGNOSIS — I679 Cerebrovascular disease, unspecified: Secondary | ICD-10-CM | POA: Diagnosis present

## 2017-04-01 DIAGNOSIS — Z87891 Personal history of nicotine dependence: Secondary | ICD-10-CM

## 2017-04-01 DIAGNOSIS — E1165 Type 2 diabetes mellitus with hyperglycemia: Secondary | ICD-10-CM | POA: Diagnosis present

## 2017-04-01 DIAGNOSIS — T68XXXA Hypothermia, initial encounter: Secondary | ICD-10-CM | POA: Diagnosis present

## 2017-04-01 DIAGNOSIS — E875 Hyperkalemia: Secondary | ICD-10-CM | POA: Diagnosis present

## 2017-04-01 DIAGNOSIS — Z8673 Personal history of transient ischemic attack (TIA), and cerebral infarction without residual deficits: Secondary | ICD-10-CM

## 2017-04-01 DIAGNOSIS — F0390 Unspecified dementia without behavioral disturbance: Secondary | ICD-10-CM | POA: Diagnosis not present

## 2017-04-01 DIAGNOSIS — N19 Unspecified kidney failure: Secondary | ICD-10-CM

## 2017-04-01 DIAGNOSIS — I5033 Acute on chronic diastolic (congestive) heart failure: Secondary | ICD-10-CM | POA: Diagnosis not present

## 2017-04-01 DIAGNOSIS — H6121 Impacted cerumen, right ear: Secondary | ICD-10-CM | POA: Diagnosis not present

## 2017-04-01 DIAGNOSIS — R54 Age-related physical debility: Secondary | ICD-10-CM | POA: Diagnosis present

## 2017-04-01 DIAGNOSIS — R6520 Severe sepsis without septic shock: Secondary | ICD-10-CM | POA: Diagnosis present

## 2017-04-01 DIAGNOSIS — E860 Dehydration: Secondary | ICD-10-CM | POA: Diagnosis present

## 2017-04-01 DIAGNOSIS — J9611 Chronic respiratory failure with hypoxia: Secondary | ICD-10-CM | POA: Diagnosis present

## 2017-04-01 DIAGNOSIS — I5032 Chronic diastolic (congestive) heart failure: Secondary | ICD-10-CM | POA: Diagnosis present

## 2017-04-01 DIAGNOSIS — M1990 Unspecified osteoarthritis, unspecified site: Secondary | ICD-10-CM | POA: Diagnosis present

## 2017-04-01 DIAGNOSIS — R402412 Glasgow coma scale score 13-15, at arrival to emergency department: Secondary | ICD-10-CM | POA: Diagnosis present

## 2017-04-01 DIAGNOSIS — A419 Sepsis, unspecified organism: Principal | ICD-10-CM

## 2017-04-01 DIAGNOSIS — E785 Hyperlipidemia, unspecified: Secondary | ICD-10-CM | POA: Diagnosis not present

## 2017-04-01 DIAGNOSIS — R946 Abnormal results of thyroid function studies: Secondary | ICD-10-CM | POA: Diagnosis present

## 2017-04-01 DIAGNOSIS — R7989 Other specified abnormal findings of blood chemistry: Secondary | ICD-10-CM | POA: Diagnosis not present

## 2017-04-01 DIAGNOSIS — R4182 Altered mental status, unspecified: Secondary | ICD-10-CM | POA: Diagnosis not present

## 2017-04-01 DIAGNOSIS — N390 Urinary tract infection, site not specified: Secondary | ICD-10-CM | POA: Diagnosis present

## 2017-04-01 DIAGNOSIS — F0150 Vascular dementia without behavioral disturbance: Secondary | ICD-10-CM | POA: Diagnosis present

## 2017-04-01 DIAGNOSIS — R627 Adult failure to thrive: Secondary | ICD-10-CM | POA: Diagnosis present

## 2017-04-01 DIAGNOSIS — R29810 Facial weakness: Secondary | ICD-10-CM | POA: Diagnosis present

## 2017-04-01 DIAGNOSIS — E1121 Type 2 diabetes mellitus with diabetic nephropathy: Secondary | ICD-10-CM | POA: Diagnosis present

## 2017-04-01 DIAGNOSIS — Z79899 Other long term (current) drug therapy: Secondary | ICD-10-CM

## 2017-04-01 DIAGNOSIS — F329 Major depressive disorder, single episode, unspecified: Secondary | ICD-10-CM | POA: Diagnosis present

## 2017-04-01 DIAGNOSIS — N185 Chronic kidney disease, stage 5: Secondary | ICD-10-CM | POA: Diagnosis not present

## 2017-04-01 DIAGNOSIS — E0821 Diabetes mellitus due to underlying condition with diabetic nephropathy: Secondary | ICD-10-CM | POA: Diagnosis not present

## 2017-04-01 DIAGNOSIS — R680 Hypothermia, not associated with low environmental temperature: Secondary | ICD-10-CM | POA: Diagnosis present

## 2017-04-01 LAB — CBC
HEMATOCRIT: 42 % (ref 39.0–52.0)
HEMATOCRIT: 34.8 % — AB (ref 39.0–52.0)
HEMOGLOBIN: 14.3 g/dL (ref 13.0–17.0)
HEMOGLOBIN: 11.8 g/dL — AB (ref 13.0–17.0)
MCH: 28 pg (ref 26.0–34.0)
MCH: 27.4 pg (ref 26.0–34.0)
MCHC: 34 g/dL (ref 30.0–36.0)
MCHC: 33.9 g/dL (ref 30.0–36.0)
MCV: 82.4 fL (ref 78.0–100.0)
MCV: 80.7 fL (ref 78.0–100.0)
Platelets: 401 10*3/uL — ABNORMAL HIGH (ref 150–400)
Platelets: 396 10*3/uL (ref 150–400)
RBC: 5.1 MIL/uL (ref 4.22–5.81)
RBC: 4.31 MIL/uL (ref 4.22–5.81)
RDW: 16.6 % — ABNORMAL HIGH (ref 11.5–15.5)
RDW: 16.8 % — AB (ref 11.5–15.5)
WBC: 7.5 10*3/uL (ref 4.0–10.5)
WBC: 9.5 10*3/uL (ref 4.0–10.5)

## 2017-04-01 LAB — I-STAT VENOUS BLOOD GAS, ED
Acid-base deficit: 13 mmol/L — ABNORMAL HIGH (ref 0.0–2.0)
BICARBONATE: 10.9 mmol/L — AB (ref 20.0–28.0)
O2 Saturation: 93 %
PCO2 VEN: 20.6 mmHg — AB (ref 44.0–60.0)
PH VEN: 7.329 (ref 7.250–7.430)
PO2 VEN: 71 mmHg — AB (ref 32.0–45.0)
TCO2: 11 mmol/L — AB (ref 22–32)

## 2017-04-01 LAB — CBG MONITORING, ED
Glucose-Capillary: 260 mg/dL — ABNORMAL HIGH (ref 65–99)
Glucose-Capillary: 176 mg/dL — ABNORMAL HIGH (ref 65–99)

## 2017-04-01 LAB — DIFFERENTIAL
BASOS ABS: 0 10*3/uL (ref 0.0–0.1)
Basophils Relative: 0 %
EOS ABS: 0 10*3/uL (ref 0.0–0.7)
Eosinophils Relative: 0 %
LYMPHS ABS: 1.2 10*3/uL (ref 0.7–4.0)
Lymphocytes Relative: 16 %
MONO ABS: 0.2 10*3/uL (ref 0.1–1.0)
MONOS PCT: 3 %
Neutro Abs: 6.1 10*3/uL (ref 1.7–7.7)
Neutrophils Relative %: 81 %

## 2017-04-01 LAB — COMPREHENSIVE METABOLIC PANEL
ALT: 15 U/L — AB (ref 17–63)
AST: 31 U/L (ref 15–41)
Albumin: 3.1 g/dL — ABNORMAL LOW (ref 3.5–5.0)
Alkaline Phosphatase: 63 U/L (ref 38–126)
Anion gap: 14 (ref 5–15)
BILIRUBIN TOTAL: 1.1 mg/dL (ref 0.3–1.2)
BUN: 219 mg/dL — AB (ref 6–20)
CO2: 8 mmol/L — ABNORMAL LOW (ref 22–32)
CREATININE: 7.78 mg/dL — AB (ref 0.61–1.24)
Calcium: 10.7 mg/dL — ABNORMAL HIGH (ref 8.9–10.3)
Chloride: 116 mmol/L — ABNORMAL HIGH (ref 101–111)
GFR, EST AFRICAN AMERICAN: 6 mL/min — AB (ref >60)
GFR, EST NON AFRICAN AMERICAN: 6 mL/min — AB (ref >60)
Glucose, Bld: 266 mg/dL — ABNORMAL HIGH (ref 65–99)
Potassium: 5.9 mmol/L — ABNORMAL HIGH (ref 3.5–5.1)
Sodium: 138 mmol/L (ref 135–145)
TOTAL PROTEIN: 7.8 g/dL (ref 6.5–8.1)

## 2017-04-01 LAB — URINALYSIS, ROUTINE W REFLEX MICROSCOPIC

## 2017-04-01 LAB — I-STAT TROPONIN, ED: TROPONIN I, POC: 0.2 ng/mL — AB (ref 0.00–0.08)

## 2017-04-01 LAB — I-STAT CG4 LACTIC ACID, ED: Lactic Acid, Venous: 2.56 mmol/L (ref 0.5–1.9)

## 2017-04-01 LAB — CREATININE, SERUM
Creatinine, Ser: 7.58 mg/dL — ABNORMAL HIGH (ref 0.61–1.24)
GFR calc Af Amer: 7 mL/min — ABNORMAL LOW (ref >60)
GFR calc non Af Amer: 6 mL/min — ABNORMAL LOW (ref >60)

## 2017-04-01 LAB — URINALYSIS, MICROSCOPIC (REFLEX)

## 2017-04-01 LAB — PROCALCITONIN: Procalcitonin: 0.72 ng/mL

## 2017-04-01 LAB — PROTIME-INR
INR: 1.25
Prothrombin Time: 15.6 seconds — ABNORMAL HIGH (ref 11.4–15.2)

## 2017-04-01 LAB — GLUCOSE, CAPILLARY: GLUCOSE-CAPILLARY: 100 mg/dL — AB (ref 65–99)

## 2017-04-01 LAB — APTT: aPTT: 25 seconds (ref 24–36)

## 2017-04-01 MED ORDER — ONDANSETRON HCL 4 MG PO TABS: 4.0000 mg | ORAL_TABLET | Freq: Four times a day (QID) | ORAL | Status: DC | PRN

## 2017-04-01 MED ORDER — INSULIN ASPART 100 UNIT/ML ~~LOC~~ SOLN: 0.0000 [IU] | Freq: Three times a day (TID) | SUBCUTANEOUS | Status: DC

## 2017-04-01 MED ORDER — ONDANSETRON HCL 4 MG/2ML IJ SOLN: 4.0000 mg | Freq: Four times a day (QID) | INTRAMUSCULAR | Status: DC | PRN

## 2017-04-01 MED ORDER — SODIUM CHLORIDE 0.9 % IV BOLUS (SEPSIS)
1000.0000 mL | Freq: Once | INTRAVENOUS | Status: AC
  Administered 2017-04-01: 1000 mL via INTRAVENOUS

## 2017-04-01 MED ORDER — PIPERACILLIN-TAZOBACTAM IN DEX 2-0.25 GM/50ML IV SOLN
2.2500 g | Freq: Three times a day (TID) | INTRAVENOUS | Status: DC
  Administered 2017-04-01 – 2017-04-03 (×6): 2.25 g via INTRAVENOUS
  Filled 2017-04-01 (×7): qty 50

## 2017-04-01 MED ORDER — VANCOMYCIN HCL 10 G IV SOLR
1500.0000 mg | Freq: Once | INTRAVENOUS | Status: AC
  Administered 2017-04-01: 1500 mg via INTRAVENOUS
  Filled 2017-04-01: qty 1500

## 2017-04-01 MED ORDER — INSULIN ASPART 100 UNIT/ML ~~LOC~~ SOLN
5.0000 [IU] | Freq: Once | SUBCUTANEOUS | Status: AC
  Administered 2017-04-01: 5 [IU] via INTRAVENOUS
  Filled 2017-04-01: qty 1

## 2017-04-01 MED ORDER — PANTOPRAZOLE SODIUM 40 MG IV SOLR
40.0000 mg | Freq: Every day | INTRAVENOUS | Status: AC
  Administered 2017-04-01 – 2017-04-02 (×2): 40 mg via INTRAVENOUS
  Filled 2017-04-01 (×2): qty 40

## 2017-04-01 MED ORDER — ACETAMINOPHEN 650 MG RE SUPP: 650.0000 mg | Freq: Four times a day (QID) | RECTAL | Status: DC | PRN

## 2017-04-01 MED ORDER — HEPARIN SODIUM (PORCINE) 5000 UNIT/ML IJ SOLN
5000.0000 [IU] | Freq: Three times a day (TID) | INTRAMUSCULAR | Status: DC
  Administered 2017-04-01 – 2017-04-03 (×5): 5000 [IU] via SUBCUTANEOUS
  Filled 2017-04-01 (×5): qty 1

## 2017-04-01 MED ORDER — VANCOMYCIN HCL IN DEXTROSE 1-5 GM/200ML-% IV SOLN: 1000.0000 mg | INTRAVENOUS | Status: DC

## 2017-04-01 MED ORDER — ACETAMINOPHEN 325 MG PO TABS: 650.0000 mg | ORAL_TABLET | Freq: Four times a day (QID) | ORAL | Status: DC | PRN

## 2017-04-01 MED ORDER — SODIUM BICARBONATE 8.4 % IV SOLN
INTRAVENOUS | Status: DC
  Administered 2017-04-01 – 2017-04-03 (×3): via INTRAVENOUS
  Filled 2017-04-01 (×6): qty 850

## 2017-04-01 NOTE — ED Notes (Signed)
Attempted report 
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
## 2017-04-01 NOTE — Code Documentation (Signed)
83yo male arriving to [facility] via [facility] at 48.  Patient from nursing home where he was LKW at 1330 per EMS when he was sitting at the nursing station conversing with staff.  He was later taken back to his room and then found with altered mental status.  Code stroke activated by EMS.  Stroke team to the bedside on patient arrival.  Labs attempted and patient cleared for CT by Dr. Eudelia Bunch.  Patient to CT with team.  Dr. Otelia Limes to the bedside.  CT completed.  NIHSS 14, see documentation for details and code stroke times.  Patient drowsy with limited, incomprehensible speech and bilateral leg weakness on exam.  Of note, EMS reports patient with hypotension on their arrival and pinpoint pupils.  No acute stroke treatment at this time.  Code stroke canceled.   Bedside handoff with ED RN Trula Ore.

## 2017-04-01 NOTE — ED Notes (Signed)
Bair hugger applied to patient.
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
## 2017-04-01 NOTE — Progress Notes (Addendum)
Pharmacy Antibiotic Note  Casey Grimes is a 83 y.o. male admitted on 04/01/2017 with sepsis.  Pharmacy has been consulted for vancomycin and zosyn dosing. WBC WNL, Temp 95.7, Scr 7.8 (3.4 in Aug 2018).  Plan: Vancomycin  IV once in ED  Goal trough 15-20 mcg/mL. Order additional vancomycin in 48 hours  Zosyn 3.375g IV q8h (4 hour infusion). Monitor clinical progression and LOT  Weight: 158 lb 15.2 oz (72.1 kg)  Temp (24hrs), Avg:95.7 F (35.4 C), Min:95.7 F (35.4 C), Max:95.7 F (35.4 C)   Recent Labs Lab 04/01/17 1457  WBC 7.5    Estimated Creatinine Clearance: 15.9 mL/min (A) (by C-G formula based on SCr of 3.41 mg/dL (H)).    No Known Allergies  Thank you for allowing pharmacy to be a part of this patient's care.  Toniann Fail Gustav Knueppel 04/01/2017 4:00 PM
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
## 2017-04-02 ENCOUNTER — Other Ambulatory Visit: Payer: Self-pay | Admitting: Internal Medicine

## 2017-04-02 ENCOUNTER — Inpatient Hospital Stay (HOSPITAL_COMMUNITY): Payer: Medicare Other

## 2017-04-02 DIAGNOSIS — Z515 Encounter for palliative care: Secondary | ICD-10-CM

## 2017-04-02 DIAGNOSIS — Z7189 Other specified counseling: Secondary | ICD-10-CM

## 2017-04-02 LAB — CBC
HEMATOCRIT: 35.1 % — AB (ref 39.0–52.0)
HEMOGLOBIN: 12 g/dL — AB (ref 13.0–17.0)
MCH: 27.6 pg (ref 26.0–34.0)
MCHC: 34.2 g/dL (ref 30.0–36.0)
MCV: 80.9 fL (ref 78.0–100.0)
Platelets: 354 10*3/uL (ref 150–400)
RBC: 4.34 MIL/uL (ref 4.22–5.81)
RDW: 16.7 % — AB (ref 11.5–15.5)
WBC: 9.2 10*3/uL (ref 4.0–10.5)

## 2017-04-02 LAB — GLUCOSE, CAPILLARY
GLUCOSE-CAPILLARY: 203 mg/dL — AB (ref 65–99)
GLUCOSE-CAPILLARY: 196 mg/dL — AB (ref 65–99)
Glucose-Capillary: 188 mg/dL — ABNORMAL HIGH (ref 65–99)

## 2017-04-02 LAB — URINE CULTURE

## 2017-04-02 LAB — COMPREHENSIVE METABOLIC PANEL
ALK PHOS: 48 U/L (ref 38–126)
ALT: 17 U/L (ref 17–63)
AST: 22 U/L (ref 15–41)
Albumin: 2.6 g/dL — ABNORMAL LOW (ref 3.5–5.0)
Anion gap: 11 (ref 5–15)
BUN: 207 mg/dL — AB (ref 6–20)
CALCIUM: 9.8 mg/dL (ref 8.9–10.3)
CHLORIDE: 116 mmol/L — AB (ref 101–111)
CO2: 16 mmol/L — AB (ref 22–32)
CREATININE: 7.08 mg/dL — AB (ref 0.61–1.24)
GFR calc non Af Amer: 6 mL/min — ABNORMAL LOW (ref >60)
GFR, EST AFRICAN AMERICAN: 7 mL/min — AB (ref >60)
Glucose, Bld: 180 mg/dL — ABNORMAL HIGH (ref 65–99)
Potassium: 5 mmol/L (ref 3.5–5.1)
SODIUM: 143 mmol/L (ref 135–145)
Total Bilirubin: 0.8 mg/dL (ref 0.3–1.2)
Total Protein: 6.4 g/dL — ABNORMAL LOW (ref 6.5–8.1)

## 2017-04-02 LAB — MRSA PCR SCREENING: MRSA by PCR: NEGATIVE

## 2017-04-02 LAB — MAGNESIUM: Magnesium: 2.5 mg/dL — ABNORMAL HIGH (ref 1.7–2.4)

## 2017-04-02 LAB — PHOSPHORUS: PHOSPHORUS: 5.4 mg/dL — AB (ref 2.5–4.6)

## 2017-04-02 MED ORDER — INSULIN ASPART 100 UNIT/ML ~~LOC~~ SOLN
0.0000 [IU] | Freq: Three times a day (TID) | SUBCUTANEOUS | Status: DC
  Administered 2017-04-02 (×2): 2 [IU] via SUBCUTANEOUS
  Administered 2017-04-03 (×2): 3 [IU] via SUBCUTANEOUS

## 2017-04-02 NOTE — Progress Notes (Signed)
[facility] Progress Note    Assessment/ Plan:    1.  Acute on chronic kidney disease -->ESRD: Pt has AKI on CKD with concomitant signs of uremia.  He has repeatedly declined dialysis; moreover he is not a candidate due to advanced age, frailty, and dementia.  In this setting, we will not offer dialysis.  I spoke to his granddaughter Phil Dopp) and his daughter-in-law who were at bedside.  Dtr-in-law updated pt's wife Annice Pih) over the phone.  All in agreement that he would not want dialysis.  I stated that I believed he was approaching end of life based on progressive renal failure, concomitant FTT, and now sepsis. I agree fully with the palliative care c/s.  Inpatient hospice may be most appropriate.   2.  Sepsis: it appears that his urine is a source.  CXR clear but pt is pretty dry.  Treating with vanc/ Zosyn.  Granddaughter and dtr in law favor moving toward complete comfort care so we will need to confirm this with pt's wife before discontinuing everything.  He is on the [employer].  His vital signs seem to have improved a little this AM  3.  Metabolic acidosis: on bicarb gtt- improving  4.  Hypotension: improved  5.  Frailty/FTT: likely multifactorial- dementia and most importantly progressive CKD.    6.  Hyperkalemia: due to #1.  Bicarb gtt should help this and it has  7.  Dispo: I recommend a hospice setting- will see what discussions with pall care brings  Subjective:    Appears comfortable this AM.  Labs are a little better after initiation of bicarb gtt.   Objective:   BP 119/62 (BP Location: Right Arm)   Pulse 81   Temp (!) 97.5 F (36.4 C) (Oral)   Resp 14   Wt 74.3 kg (163 lb 14.4 oz)   SpO2 100%   BMI 22.86 kg/m   Intake/Output Summary (Last 24 hours) at 04/02/17 0947 Last data filed at 04/02/17 0648  Gross per 24 hour  Intake          2858.34 ml  Output                3 ml  Net          2855.34 ml   Weight change:   Physical  Exam: GEN sleeping, a little more arousable this AM but not much.  Appears comfortable. HEENT eyes closed, dry MM with crusted tongue NECK no JVD PULM slightly tachypneic but no crackles CV RRR ABD thin EXT no LE edema NEURO obtunded SKIN poor skin turgor  Imaging: Dg Chest Port 1 View  Result Date: 04/02/2017 CLINICAL DATA:  Sepsis.  Diabetes and hypertension. EXAM: PORTABLE CHEST 1 VIEW COMPARISON:  04/01/2017 FINDINGS: Heart size is normal. There is aortic atherosclerosis. Lungs are clear. The vascularity is normal. No effusions. No collapse. No significant bone finding. IMPRESSION: No active disease. Electronically Signed   By: Paulina Fusi M.D.   On: 04/02/2017 08:25   Dg Chest Portable 1 View  Result Date: 04/01/2017 CLINICAL DATA:  Dyspnea EXAM: PORTABLE CHEST 1 VIEW COMPARISON:  03/10/2017 FINDINGS: Heart is normal in size. There is moderate aortic atherosclerosis without aneurysm. The lungs are free of pneumonic consolidations, effusions or pneumothoraces. No overt pulmonary edema. Atherosclerosis of the extracranial carotid arteries are partially imaged within the neck. No acute nor suspicious osseous abnormality. IMPRESSION: Aortic atherosclerosis.  No active cardiopulmonary disease. Electronically Signed   By: Rene Kocher.D.  On: 04/01/2017 17:56   Ct Head Code Stroke Wo Contrast  Addendum Date: 04/01/2017   ADDENDUM REPORT: 04/01/2017 15:20 ADDENDUM: These results were called by telephone at the time of interpretation on 04/01/2017 at 3:20 pm to Dr. Otelia Limes, who verbally acknowledged these results. Electronically Signed   By: Marlan Palau M.D.   On: 04/01/2017 15:20   Result Date: 04/01/2017 CLINICAL DATA:  Code stroke.  Left facial droop EXAM: CT HEAD WITHOUT CONTRAST TECHNIQUE: Contiguous axial images were obtained from the base of the skull through the vertex without intravenous contrast. COMPARISON:  CT head 04/05/2016, MRI 07/17/2014 FINDINGS: Brain: Moderate to advanced  atrophy. Chronic microvascular ischemic change throughout the cerebral white matter bilaterally. Chronic infarct left internal capsule and right putamen. Chronic ischemia pons. Negative for acute infarct, hemorrhage, mass Vascular: Negative for hyperdense vessel Skull: Defect in the left frontal bone and scalp unchanged from the prior study. No acute skeletal abnormality Sinuses/Orbits: Cyanosis clear.  Bilateral cataract removal. Other: None ASPECTS (Alberta Stroke Program Early CT Score) - Ganglionic level infarction (caudate, lentiform nuclei, internal capsule, insula, M1-M3 cortex): 7 - Supraganglionic infarction (M4-M6 cortex): 3 Total score (0-10 with 10 being normal): 10 IMPRESSION: 1. Extensive atrophy with chronic microvascular ischemia. No acute abnormality 2. ASPECTS is 10 Electronically Signed: By: Marlan Palau M.D. On: 04/01/2017 15:11    Labs: BMET  Recent Labs Lab 04/01/17 1457 04/01/17 2037 04/02/17 0401  NA 138  --  143  K 5.9*  --  5.0  CL 116*  --  116*  CO2 8*  --  16*  GLUCOSE 266*  --  180*  BUN 219*  --  207*  CREATININE 7.78* 7.58* 7.08*  CALCIUM 10.7*  --  9.8  PHOS  --   --  5.4*   CBC  Recent Labs Lab 04/01/17 1457 04/01/17 2037 04/02/17 0401  WBC 7.5 9.5 9.2  NEUTROABS 6.1  --   --   HGB 14.3 11.8* 12.0*  HCT 42.0 34.8* 35.1*  MCV 82.4 80.7 80.9  PLT 401* 396 354    Medications:    . heparin  5,000 Units Subcutaneous Q8H  . insulin aspart  0-9 Units Subcutaneous Q8H  . pantoprazole (PROTONIX) IV  40 mg Intravenous QHS      Bufford Buttner, MD [facility] pgr [phone] 04/02/2017, 9:47 AM

## 2017-04-02 NOTE — Progress Notes (Signed)
PROGRESS NOTE    Lydia Meng Giebler  UJW:[id number]  DOB: 03-Nov-1928  DOA: 04/01/2017 PCP: Myrlene Broker, MD   Brief Admission Hx: CONCEPCION KIRKPATRICK is a 83 y.o. male with dementia presented from nursing home by EMS with acute mental status change, dehydration, sepsis and renal failure.   MDM/Assessment & Plan:   1. Severe Sepsis -patient is critically ill and the source of infection appears to be urinary.   Empirically treat with Zosyn and vancomycin IV.  Follow urine culture. 2. Acute renal failure on CKD / ESRD-per nephrology team, he is not a candidate for hemodialysis, suspect prerenal causes, hydrating with IV fluids, follow BMP closely. Avoid nephrotoxins. It is well documented that the patient was considered a poor dialysis candidate and the patient had declined dialysis on multiple occasions. The nephrology service was consulted by ED. He is critically ill but hopefully with hydration his renal function will improve. His prognosis is poor and I am requesting palliative medicine consult. 3. Hypothermia- resolved, suspect secondary to severe sepsis and dehydration with ongoing acute renal failure and end-stage renal disease. Treated with a warming blanket.  4. Hyperkalemia-treated with insulin in the emergency department also treated with bicarbonate infusion. 5. Metabolic acidosis secondary to renal failure (uremia)-treating with IV bicarbonate infusion. Monitor metabolic panel closely. 6. Hyperglycemia-treated with IV insulin in the emergency department. Repeat testing shows improvement. 7. Type 2 diabetes mellitus, insulin requiring with renal, neurological and vascular complications-holding basal insulin at this time, treating with sensitive sliding scale every 8 hours. 8. Hypotension-secondary to severe sepsis and dehydration-improving with IV fluid hydration. Monitor closely in the stepdown unit. 9. UTI-treating with IV Zosyn and follow urine culture and sensitivity  results. 10. Altered mental status-slightly improved from admission, multifactorial given severe sepsis, dehydration, renal failure and dementia-will need to treat medically as noted above and follow closely. Appreciate neurology consultation. Code sepsis discontinued. 11. DO NOT RESUSCITATE - I confirmed with Dr. Dalene Seltzer and family.   I also requested a palliative medicine consult regarding goals of care and disposition.  I think family would be in agreement with full comfort care and possible resident hospice placement.   DVT Prophylaxis: heparin SUP: protonix Code Status: DNR Disposition Plan: TBD   Consultants:  nephrology  Subjective: Pt still lethargic but slightly improved from admission  Objective: Vitals:   04/01/17 1845 04/02/17 0035 04/02/17 0437 04/02/17 0815  BP: 105/60 121/60 119/62   Pulse: 76 75 83 81  Resp: Temp:  (!) 97.4 F (36.3 C) (!) 97.5 F (36.4 C)   TempSrc:  Oral Oral   SpO2: 100% 99% 100% 100%  Weight:   74.3 kg (163 lb 14.4 oz)     Intake/Output Summary (Last 24 hours) at 04/02/17 1114 Last data filed at 04/02/17 5621  Gross per 24 hour  Intake          2858.34 ml  Output                3 ml  Net          2855.34 ml   Filed Weights   04/01/17 1500 04/02/17 0437  Weight: 72.1 kg (158 lb 15.2 oz) 74.3 kg (163 lb 14.4 oz)     REVIEW OF SYSTEMS  As per history otherwise all reviewed and reported negative  Exam:  General exam: chronically and critically ill appearing, minimally responsive. Respiratory system: shallow breathing. Cardiovascular system: S1 & S2 heard,  Gastrointestinal system: Abdomen is  nondistended, soft and nontender. Normal bowel sounds heard. Central nervous system: obtunded. No focal neurological deficits. Extremities: no cyanosis.   Data Reviewed: Basic Metabolic Panel:  Recent Labs Lab 04/01/17 1457 04/01/17 2037 04/02/17 0401  NA 138  --  143  K 5.9*  --  5.0  CL 116*  --  116*  CO2 8*   --  16*  GLUCOSE 266*  --  180*  BUN 219*  --  207*  CREATININE 7.78* 7.58* 7.08*  CALCIUM 10.7*  --  9.8  MG  --   --  2.5*  PHOS  --   --  5.4*   Liver Function Tests:  Recent Labs Lab 04/01/17 1457 04/02/17 0401  AST 31 22  ALT 15* 17  ALKPHOS 63 48  BILITOT 1.1 0.8  PROT 7.8 6.4*  ALBUMIN 3.1* 2.6*   No results for input(s): LIPASE, AMYLASE in the last 168 hours. No results for input(s): AMMONIA in the last 168 hours. CBC:  Recent Labs Lab 04/01/17 1457 04/01/17 2037 04/02/17 0401  WBC 7.5 9.5 9.2  NEUTROABS 6.1  --   --   HGB 14.3 11.8* 12.0*  HCT 42.0 34.8* 35.1*  MCV 82.4 80.7 80.9  PLT 401* 396 354   Cardiac Enzymes: No results for input(s): CKTOTAL, CKMB, CKMBINDEX, TROPONINI in the last 168 hours. CBG (last 3)   Recent Labs  04/01/17 1725 04/01/17 2140 04/02/17 0741  GLUCAP 176* 100* 188*   Recent Results (from the past 240 hour(s))  Blood culture (routine x 2)     Status: None (Preliminary result)   Collection Time: 04/01/17  3:43 PM  Result Value Ref Range Status   Specimen Description BLOOD RIGHT WRIST  Final   Special Requests   Final    BOTTLES DRAWN AEROBIC AND ANAEROBIC Blood Culture adequate volume   Culture NO GROWTH < 24 HOURS  Final   Report Status PENDING  Incomplete  Blood culture (routine x 2)     Status: None (Preliminary result)   Collection Time: 04/01/17  3:48 PM  Result Value Ref Range Status   Specimen Description BLOOD RIGHT HAND  Final   Special Requests IN PEDIATRIC BOTTLE Blood Culture adequate volume  Final   Culture NO GROWTH < 24 HOURS  Final   Report Status PENDING  Incomplete  MRSA PCR Screening     Status: None   Collection Time: 04/02/17 12:52 AM  Result Value Ref Range Status   MRSA by PCR NEGATIVE NEGATIVE Final    Comment:        The GeneXpert MRSA Assay (FDA approved for NASAL specimens only), is one component of a comprehensive MRSA colonization surveillance program. It is not intended to diagnose  MRSA infection nor to guide or monitor treatment for MRSA infections.      Studies: Dg Chest Port 1 View  Result Date: 04/02/2017 CLINICAL DATA:  Sepsis.  Diabetes and hypertension. EXAM: PORTABLE CHEST 1 VIEW COMPARISON:  04/01/2017 FINDINGS: Heart size is normal. There is aortic atherosclerosis. Lungs are clear. The vascularity is normal. No effusions. No collapse. No significant bone finding. IMPRESSION: No active disease. Electronically Signed   By: Paulina Fusi M.D.   On: 04/02/2017 08:25   Dg Chest Portable 1 View  Result Date: 04/01/2017 CLINICAL DATA:  Dyspnea EXAM: PORTABLE CHEST 1 VIEW COMPARISON:  03/10/2017 FINDINGS: Heart is normal in size. There is moderate aortic atherosclerosis without aneurysm. The lungs are free of pneumonic consolidations, effusions or pneumothoraces. No overt  pulmonary edema. Atherosclerosis of the extracranial carotid arteries are partially imaged within the neck. No acute nor suspicious osseous abnormality. IMPRESSION: Aortic atherosclerosis.  No active cardiopulmonary disease. Electronically Signed   By: Tollie Eth M.D.   On: 04/01/2017 17:56   Ct Head Code Stroke Wo Contrast  Addendum Date: 04/01/2017   ADDENDUM REPORT: 04/01/2017 15:20 ADDENDUM: These results were called by telephone at the time of interpretation on 04/01/2017 at 3:20 pm to Dr. Otelia Limes, who verbally acknowledged these results. Electronically Signed   By: Marlan Palau M.D.   On: 04/01/2017 15:20   Result Date: 04/01/2017 CLINICAL DATA:  Code stroke.  Left facial droop EXAM: CT HEAD WITHOUT CONTRAST TECHNIQUE: Contiguous axial images were obtained from the base of the skull through the vertex without intravenous contrast. COMPARISON:  CT head 04/05/2016, MRI 07/17/2014 FINDINGS: Brain: Moderate to advanced atrophy. Chronic microvascular ischemic change throughout the cerebral white matter bilaterally. Chronic infarct left internal capsule and right putamen. Chronic ischemia pons. Negative  for acute infarct, hemorrhage, mass Vascular: Negative for hyperdense vessel Skull: Defect in the left frontal bone and scalp unchanged from the prior study. No acute skeletal abnormality Sinuses/Orbits: Cyanosis clear.  Bilateral cataract removal. Other: None ASPECTS (Alberta Stroke Program Early CT Score) - Ganglionic level infarction (caudate, lentiform nuclei, internal capsule, insula, M1-M3 cortex): 7 - Supraganglionic infarction (M4-M6 cortex): 3 Total score (0-10 with 10 being normal): 10 IMPRESSION: 1. Extensive atrophy with chronic microvascular ischemia. No acute abnormality 2. ASPECTS is 10 Electronically Signed: By: Marlan Palau M.D. On: 04/01/2017 15:11   Scheduled Meds: . heparin  5,000 Units Subcutaneous Q8H  . insulin aspart  0-9 Units Subcutaneous Q8H  . pantoprazole (PROTONIX) IV  40 mg Intravenous QHS   Continuous Infusions: . piperacillin-tazobactam (ZOSYN)  IV Stopped (04/02/17 0718)  .  sodium bicarbonate (isotonic) infusion in sterile water 100 mL/hr at 04/02/17 0700    Principal Problem:   Metabolic acidosis Active Problems:   Hyperkalemia   Altered mental status, unspecified   Severe dehydration   Acute renal failure superimposed on stage 4 chronic kidney disease (HCC)   Hypotension   Severe sepsis (HCC)   Elevated lactic acid level   Hypothermia   UTI (urinary tract infection)   Diabetes mellitus with diabetic nephropathy (HCC)   Diabetic peripheral neuropathy (HCC)   History of CVA (cerebrovascular accident)   Dementia   Diabetes mellitus with complication (HCC)  Time spent:   Standley Dakins, MD, FAAFP [facility] Pager [phone]  If 7PM-7AM, please contact night-coverage [email or web address] Password [email or web address] 04/02/2017, 11:14 AM    LOS: 1 day
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
## 2017-04-02 NOTE — Consult Note (Signed)
Consultation Note Date: 04/02/2017   Patient Name: Casey Grimes  DOB: February 06, 1929  MRN: [id number]  Age / Sex: 83 y.o., male  PCP: Hoyt Koch, MD Referring Physician: Murlean Iba, MD  Reason for Consultation: Establishing goals of care  HPI/Patient Profile: 83 y.o. male  with past medical history of CHF, CKD V, DM2, HTN, CVA, chronic resp failure admitted on 04/01/2017 with sepsis and renal failure.   Clinical Assessment and Goals of Care: I met today with patient's wife, son, and daughter. We discussed clinical course as well as wishes moving forward in regard to advanced directives.   We discussed difference between a aggressive medical intervention path and a palliative, comfort focused care path.  Values and goals of care important to patient and family were attempted to be elicited.  Concept of Hospice and Palliative Care were discussed  Questions and concerns addressed.   PMT will continue to support holistically.  SUMMARY OF RECOMMENDATIONS   - Family is debating transition to full comfort care.  Wife and daughter seem to be in agreement.  Son is not opposed but wants to talk to rest of family and "sleep on it." - F/u tomorrow at 1200.  Code Status/Advance Care Planning:  DNR     Primary Diagnoses: Present on Admission: . Metabolic acidosis . Hyperkalemia . Altered mental status, unspecified . Severe dehydration . Acute renal failure superimposed on stage 4 chronic kidney disease ([place]) . Hypotension . Diabetic peripheral neuropathy ([place]) . Diabetes mellitus with diabetic nephropathy ([place]) . Diabetes mellitus with complication ([place]) . Severe sepsis ([place]) . UTI (urinary tract infection) . Elevated lactic acid level . Dementia . Hypothermia   I have reviewed the medical record, interviewed the patient and family, and examined the patient. The following aspects are  pertinent.  Past Medical History:  Diagnosis Date  . Acute on chronic renal failure ([place])    [name] 04/29/2015  . Chronic diastolic CHF (congestive heart failure) ([place])    Echo on 04/30/2015 showed EF of 60-65% and Grade 2 Diastolic Dysfunction  . Chronic kidney disease (CKD), stage IV (severe) ([facility])       with near end-stage kidney disease/notes 04/29/2015  . Chronic lower back pain   . CVA (cerebral vascular accident) ([place]) 1980's  . Dementia   . Dementia   . Depression   . Diabetic peripheral neuropathy ([place])   . Former smoker    quit 1988  . Gout   . Hyperlipidemia   . Hypertension   . Impotence   . Vertell Limber    [name] 04/29/2015  . Osteoarthritis   . PND (paroxysmal nocturnal dyspnea)    [name] 04/29/2015  . Respiratory failure with hypoxia ([place]) 07/23/2013  . Type II diabetes mellitus ([place])    Social History   Social History  . Marital status: Married    Spouse name: [name]  . Number of children: 1  . Years of education: 6   Occupational History  . retired Retired   Social History Main Topics  .  Smoking status: Former Smoker    Packs/day: 1.00    Years: 40.00    Types: Cigarettes    Quit date: 07/19/1986  . Smokeless tobacco: Never Used  . Alcohol use No     Comment: 07/23/2012 "stopped drinking ~ 30 yr ago; sometimes drank too much"  . Drug use: No  . Sexual activity: No   Other Topics Concern  . None   Social History Narrative   6th grade, married - >50 years, 1 son, others (?). Lives with his wife. Work - retired and disabled.   Patient lives at [facility].   Caffeine Use: none   Family History  Problem Relation Age of Onset  . Other Mother   . Coronary artery disease Neg Hx    Scheduled Meds: . heparin  5,000 Units Subcutaneous Q8H  . insulin aspart  0-9 Units Subcutaneous Q8H   Continuous Infusions: . piperacillin-tazobactam (ZOSYN)  IV 2.25 g (04/02/17 2139)  .  sodium bicarbonate (isotonic) infusion in sterile water  100 mL/hr at 04/02/17 1624   PRN Meds:.acetaminophen **OR** acetaminophen, ondansetron **OR** ondansetron (ZOFRAN) IV Medications Prior to Admission:  Prior to Admission medications   Medication Sig Start Date End Date Taking? Authorizing Provider  allopurinol (ZYLOPRIM) 300 MG tablet Take 150 mg by mouth daily.     Yes [provider]  amLODipine (NORVASC) 10 MG tablet TAKE 1 TABLET BY MOUTH EVERY DAY 08/06/16  Yes Hoyt Koch, MD  aspirin EC 325 MG tablet Take 325 mg by mouth daily.   Yes [provider]  atorvastatin (LIPITOR) 10 MG tablet TAKE 1 TABLET (10 MG TOTAL) BY MOUTH DAILY. 09/08/15  Yes Hoyt Koch, MD  calcitRIOL (ROCALTROL) 0.25 MCG capsule TAKE 1 CAPSULE (0.25 MCG TOTAL) BY MOUTH DAILY. 09/27/16  Yes Hoyt Koch, MD  carvedilol (COREG) 25 MG tablet Take 1 tablet (25 mg total) by mouth 2 (two) times daily. Follow-up appt due must see provider for future refills 03/04/17  Yes Hoyt Koch, MD  cloNIDine (CATAPRES) 0.2 MG tablet TAKE 1 TABLET BY MOUTH TWICE A DAY Patient taking differently: TAKE 0.2 mg TABLET BY MOUTH TWICE A DAY 04/01/17  Yes Hoyt Koch, MD  doxycycline (ADOXA) 50 MG tablet Take 50 mg by mouth 2 (two) times daily.   Yes [provider]  feeding supplement, ENSURE ENLIVE, (ENSURE ENLIVE) LIQD Take 237 mLs by mouth 2 (two) times daily between meals. 03/15/17  Yes Elodia Florence., MD  hydrALAZINE (APRESOLINE) 50 MG tablet TAKE 1 TABLET (50 MG TOTAL) BY MOUTH 4 (FOUR) TIMES DAILY. 03/02/17  Yes Hoyt Koch, MD  insulin glargine (LANTUS) 100 UNIT/ML injection Inject 0.16 mLs (16 Units total) into the skin at bedtime. 11/11/16  Yes Vann, Jessica U, DO  KLOR-CON M20 20 MEQ tablet TAKE 1 TABLET (20 MEQ TOTAL) BY MOUTH DAILY. 01/24/17  Yes Golden Circle, FNP  mirtazapine (REMERON) 7.5 MG tablet Take 7.5 mg by mouth at bedtime.   Yes [provider]  sennosides-docusate sodium  (SENOKOT-S) 8.6-50 MG tablet Take 1 tablet by mouth 2 (two) times daily between meals as needed for constipation. 03/10/17  Yes Nche, Charlene Brooke, NP  tobramycin (TOBREX) 0.3 % ophthalmic solution Place 1 drop into the left eye every 6 (six) hours. 11/11/16  Yes Eulogio Bear U, DO   No Known Allergies Review of Systems Unable to assess  Physical Exam General: Somnolent, in no acute distress, chronically ill appearing Heart:  Regular rate and rhythm. No murmur appreciated. Lungs: Diminished air movement Abdomen: Soft, nontender, nondistended, positive bowel sounds.  Ext: No significant edema Skin: Warm and dry  Vital Signs: BP (!) 144/78 (BP Location: Right Arm)   Pulse 88   Temp 97.8 F (36.6 C) (Oral)   Resp 16   Wt 74.3 kg (163 lb 14.4 oz)   SpO2 100%   BMI 22.86 kg/m  Pain Assessment: PAINAD       SpO2: SpO2: 100 % O2 Device:SpO2: 100 % O2 Flow Rate: .   IO: Intake/output summary:  Intake/Output Summary (Last 24 hours) at 04/02/17 2303 Last data filed at 04/02/17 1624  Gross per 24 hour  Intake          1826.67 ml  Output              302 ml  Net          1524.67 ml    LBM: Last BM Date: 03/12/17 Baseline Weight: Weight: 72.1 kg (158 lb 15.2 oz) Most recent weight: Weight: 74.3 kg (163 lb 14.4 oz)     Palliative Assessment/Data:   Flowsheet Rows     Most Recent Value  Intake Tab  Referral Department  Hospitalist  Unit at Time of Referral  Med/Surg Unit  Palliative Care Primary Diagnosis  Sepsis/Infectious Disease  Date Notified  04/01/17  Palliative Care Type  New Palliative care  Reason for referral  Clarify Goals of Care  Date of Admission  04/01/17  Date first seen by Palliative Care  04/02/17  # of days Palliative referral response time  1 Day(s)  # of days IP prior to Palliative referral  0  Clinical Assessment  Palliative Performance Scale Score  10%  Pain Max last 24 hours  Not able to report  Pain Min Last 24 hours  Not able to report    Psychosocial & Spiritual Assessment  Palliative Care Outcomes  Patient/Family meeting held?  Yes  Who was at the meeting?  Wife, son, daughter  Palliative Care Outcomes  Clarified goals of care       Time Total:60 Greater than 50%  of this time was spent counseling and coordinating care related to the above assessment and plan.  Signed by: Micheline Rough, MD   Please contact Palliative Medicine Team phone at [phone] for questions and concerns.  For individual provider: See Shea Evans

## 2017-04-03 LAB — COMPREHENSIVE METABOLIC PANEL
ALT: 17 U/L (ref 17–63)
AST: 26 U/L (ref 15–41)
Albumin: 2.5 g/dL — ABNORMAL LOW (ref 3.5–5.0)
Alkaline Phosphatase: 47 U/L (ref 38–126)
Anion gap: 13 (ref 5–15)
BILIRUBIN TOTAL: 0.7 mg/dL (ref 0.3–1.2)
BUN: 197 mg/dL — AB (ref 6–20)
CHLORIDE: 109 mmol/L (ref 101–111)
CO2: 26 mmol/L (ref 22–32)
Calcium: 9.5 mg/dL (ref 8.9–10.3)
Creatinine, Ser: 6.69 mg/dL — ABNORMAL HIGH (ref 0.61–1.24)
GFR, EST AFRICAN AMERICAN: 8 mL/min — AB (ref >60)
GFR, EST NON AFRICAN AMERICAN: 7 mL/min — AB (ref >60)
Glucose, Bld: 212 mg/dL — ABNORMAL HIGH (ref 65–99)
POTASSIUM: 4.2 mmol/L (ref 3.5–5.1)
Sodium: 148 mmol/L — ABNORMAL HIGH (ref 135–145)
TOTAL PROTEIN: 6.2 g/dL — AB (ref 6.5–8.1)

## 2017-04-03 LAB — GLUCOSE, CAPILLARY
GLUCOSE-CAPILLARY: 213 mg/dL — AB (ref 65–99)
Glucose-Capillary: 247 mg/dL — ABNORMAL HIGH (ref 65–99)

## 2017-04-03 LAB — MAGNESIUM: MAGNESIUM: 2.6 mg/dL — AB (ref 1.7–2.4)

## 2017-04-03 LAB — CBC
HCT: 34.5 % — ABNORMAL LOW (ref 39.0–52.0)
Hemoglobin: 11.6 g/dL — ABNORMAL LOW (ref 13.0–17.0)
MCH: 27.3 pg (ref 26.0–34.0)
MCHC: 33.6 g/dL (ref 30.0–36.0)
MCV: 81.2 fL (ref 78.0–100.0)
Platelets: 350 10*3/uL (ref 150–400)
RBC: 4.25 MIL/uL (ref 4.22–5.81)
RDW: 16.4 % — AB (ref 11.5–15.5)
WBC: 9.5 10*3/uL (ref 4.0–10.5)

## 2017-04-03 LAB — PHOSPHORUS: PHOSPHORUS: 5.6 mg/dL — AB (ref 2.5–4.6)

## 2017-04-03 LAB — URINE CULTURE

## 2017-04-03 MED ORDER — LORAZEPAM 2 MG/ML PO CONC: 1.0000 mg | ORAL | Status: DC | PRN

## 2017-04-03 MED ORDER — MORPHINE SULFATE (PF) 2 MG/ML IV SOLN: 2.0000 mg | INTRAVENOUS | Status: DC | PRN

## 2017-04-03 MED ORDER — POLYVINYL ALCOHOL 1.4 % OP SOLN: 1.0000 [drp] | Freq: Four times a day (QID) | OPHTHALMIC | Status: DC | PRN

## 2017-04-03 MED ORDER — GLYCOPYRROLATE 1 MG PO TABS: 1.0000 mg | ORAL_TABLET | ORAL | Status: DC | PRN

## 2017-04-03 MED ORDER — HALOPERIDOL LACTATE 2 MG/ML PO CONC: 0.5000 mg | ORAL | Status: DC | PRN

## 2017-04-03 MED ORDER — HALOPERIDOL LACTATE 5 MG/ML IJ SOLN: 0.5000 mg | INTRAMUSCULAR | Status: DC | PRN

## 2017-04-03 MED ORDER — GLYCOPYRROLATE 0.2 MG/ML IJ SOLN: 0.2000 mg | INTRAMUSCULAR | Status: DC | PRN

## 2017-04-03 MED ORDER — RESOURCE THICKENUP CLEAR PO POWD
ORAL | Status: DC | PRN
  Filled 2017-04-03 (×2): qty 125

## 2017-04-03 MED ORDER — SODIUM CHLORIDE 0.45 % IV SOLN
INTRAVENOUS | Status: DC
  Administered 2017-04-03: 08:00:00 via INTRAVENOUS

## 2017-04-03 MED ORDER — BIOTENE DRY MOUTH MT LIQD: 15.0000 mL | OROMUCOSAL | Status: DC | PRN

## 2017-04-03 MED ORDER — FENTANYL CITRATE (PF) 100 MCG/2ML IJ SOLN: 12.5000 ug | INTRAMUSCULAR | Status: DC | PRN

## 2017-04-03 MED ORDER — HALOPERIDOL 0.5 MG PO TABS: 0.5000 mg | ORAL_TABLET | ORAL | Status: DC | PRN

## 2017-04-03 MED ORDER — LORAZEPAM 2 MG/ML IJ SOLN: 1.0000 mg | INTRAMUSCULAR | Status: DC | PRN

## 2017-04-03 MED ORDER — LORAZEPAM 1 MG PO TABS: 1.0000 mg | ORAL_TABLET | ORAL | Status: DC | PRN

## 2017-04-03 NOTE — Progress Notes (Signed)
Casey Grimes Progress Note    Assessment/ Plan:    1.  Acute on chronic kidney disease -->ESRD: Pt has AKI on CKD with concomitant signs of uremia.  He has repeatedly declined dialysis; moreover he is not a candidate due to advanced age, frailty, and dementia.  In this setting, we will not offer dialysis.  I spoke to his granddaughter Casey Grimes) and his daughter-in-law who were at bedside.  Dtr-in-law updated pt's wife Casey Grimes) over the phone.  All in agreement that he would not want dialysis.  I stated that I believed he was approaching end of life based on progressive renal failure, concomitant FTT, and now sepsis.  IVF are improving labs but not overall trajectory.    2.  Sepsis: it appears that his urine is a source.  Treating with vanc/ Zosyn.  3.  Metabolic acidosis: resolved after bicarb gtt  4.  Hypernatremia: Na 148, hypotonic fluids started  5.  Hypotension: improved  6.  Frailty/FTT: likely multifactorial- dementia and most importantly progressive CKD.    7.  Hyperkalemia: resolved.  8.  Dispo: I recommend a hospice setting- appreciate palliative care involvement.  Family considering full transition to comfort care.  We will sign off.    Subjective:    Appears comfortable this AM.  Slightly hypernatremic today- hypotonic fluids started.    Objective:   BP (!) 151/76 (BP Location: Right Arm)   Pulse 85   Temp 98.5 F (36.9 C) (Oral)   Resp 13   Ht  (1.803 m)   Wt 73 kg (161 lb)   SpO2 100%   BMI 22.45 kg/m   Intake/Output Summary (Last 24 hours) at 04/03/17 1109 Last data filed at 04/03/17 1610  Gross per 24 hour  Intake          2531.66 ml  Output             1150 ml  Net          1381.66 ml   Weight change: 1.836 kg (4 lb 0.8 oz)  Physical Exam: GEN sleeping, appears comfortable.   HEENT eyes closed NECK no JVD PULM slightly tachypneic  CV RRR ABD thin EXT no LE edema NEURO obtunded  Imaging: Dg Chest Port 1  View  Result Date: 04/02/2017 CLINICAL DATA:  Sepsis.  Diabetes and hypertension. EXAM: PORTABLE CHEST 1 VIEW COMPARISON:  04/01/2017 FINDINGS: Heart size is normal. There is aortic atherosclerosis. Lungs are clear. The vascularity is normal. No effusions. No collapse. No significant bone finding. IMPRESSION: No active disease. Electronically Signed   By: Paulina Fusi M.D.   On: 04/02/2017 08:25   Dg Chest Portable 1 View  Result Date: 04/01/2017 CLINICAL DATA:  Dyspnea EXAM: PORTABLE CHEST 1 VIEW COMPARISON:  03/10/2017 FINDINGS: Heart is normal in size. There is moderate aortic atherosclerosis without aneurysm. The lungs are free of pneumonic consolidations, effusions or pneumothoraces. No overt pulmonary edema. Atherosclerosis of the extracranial carotid arteries are partially imaged within the neck. No acute nor suspicious osseous abnormality. IMPRESSION: Aortic atherosclerosis.  No active cardiopulmonary disease. Electronically Signed   By: Tollie Eth M.D.   On: 04/01/2017 17:56   Ct Head Code Stroke Wo Contrast  Addendum Date: 04/01/2017   ADDENDUM REPORT: 04/01/2017 15:20 ADDENDUM: These results were called by telephone at the time of interpretation on 04/01/2017 at 3:20 pm to Dr. Otelia Limes, who verbally acknowledged these results. Electronically Signed   By: Marlan Palau M.D.   On: 04/01/2017 15:20  Result Date: 04/01/2017 CLINICAL DATA:  Code stroke.  Left facial droop EXAM: CT HEAD WITHOUT CONTRAST TECHNIQUE: Contiguous axial images were obtained from the base of the skull through the vertex without intravenous contrast. COMPARISON:  CT head 04/05/2016, MRI 07/17/2014 FINDINGS: Brain: Moderate to advanced atrophy. Chronic microvascular ischemic change throughout the cerebral white matter bilaterally. Chronic infarct left internal capsule and right putamen. Chronic ischemia pons. Negative for acute infarct, hemorrhage, mass Vascular: Negative for hyperdense vessel Skull: Defect in the left  frontal bone and scalp unchanged from the prior study. No acute skeletal abnormality Sinuses/Orbits: Cyanosis clear.  Bilateral cataract removal. Other: None ASPECTS (Alberta Stroke Program Early CT Score) - Ganglionic level infarction (caudate, lentiform nuclei, internal capsule, insula, M1-M3 cortex): 7 - Supraganglionic infarction (M4-M6 cortex): 3 Total score (0-10 with 10 being normal): 10 IMPRESSION: 1. Extensive atrophy with chronic microvascular ischemia. No acute abnormality 2. ASPECTS is 10 Electronically Signed: By: Marlan Palau M.D. On: 04/01/2017 15:11    Labs: BMET  Recent Labs Lab 04/01/17 1457 04/01/17 2037 04/02/17 0401 04/03/17 0354  NA 138  --  143 148*  K 5.9*  --  5.0 4.2  CL 116*  --  116* 109  CO2 8*  --  16* 26  GLUCOSE 266*  --  180* 212*  BUN 219*  --  207* 197*  CREATININE 7.78* 7.58* 7.08* 6.69*  CALCIUM 10.7*  --  9.8 9.5  PHOS  --   --  5.4* 5.6*   CBC  Recent Labs Lab 04/01/17 1457 04/01/17 2037 04/02/17 0401 04/03/17 0354  WBC 7.5 9.5 9.2 9.5  NEUTROABS 6.1  --   --   --   HGB 14.3 11.8* 12.0* 11.6*  HCT 42.0 34.8* 35.1* 34.5*  MCV 82.4 80.7 80.9 81.2  PLT 401* 396 354 350    Medications:    . heparin  5,000 Units Subcutaneous Q8H  . insulin aspart  0-9 Units Subcutaneous Q8H      Casey Buttner, MD [facility] pgr [phone] 04/03/2017, 11:09 AM

## 2017-04-03 NOTE — Progress Notes (Signed)
Palliative Care Progress Note  Reason for consult: Establishing goals of care in light of renal failure and sepsis  I met today with patient's wife and 2 sons.  We reviewed again  His clinical course as well as continued decline in nutrition, cognition, and functional status as well as irreversible renal failure.  Discussed care options moving forward including continuation of current therapies vs transition to full comfort care.  While some of his labs are marginally improved today, his overall trajectory is not improving and he will continue to decline as soon as aggressive hospital interventions are discontinued.  We reviewed plan for comfort care including discontinuation of IVF, insulin, and abx.  He is not taking in any meaningful hydration by mouth.  I discussed with family expected prognosis of days to < 2 weeks.  Family is hopeful for transition to residential hospice for EOL care.  - FULL COMFORT CARE: Resting comfortably at time of exam.  I placed orders via EOL order set. - Hopeful for placement at [facility]: Referral to SW to facilitate - Attending notified via page.  Total time: 50 minutes Greater than 50%  of this time was spent counseling and coordinating care related to the above assessment and plan.  Micheline Rough, MD [place] Team [phone]

## 2017-04-03 NOTE — Progress Notes (Signed)
PROGRESS NOTE    Casey Grimes  ZOX:[id number]  DOB: 1928/11/01  DOA: 04/01/2017 PCP: Myrlene Broker, MD   Brief Admission Hx: Casey Grimes is a 83 y.o. male with dementia presented from nursing home by EMS with acute mental status change, dehydration, sepsis and renal failure.   MDM/Assessment & Plan:   1. Severe Sepsis -patient is critically ill and the source of infection appears to be urinary.   Empirically treat with Zosyn and vancomycin IV.  Follow urine culture. 2. Acute renal failure on CKD / ESRD-per nephrology team, he is not a candidate for hemodialysis, suspect prerenal causes, hydrating with IV fluids, follow BMP closely. Avoid nephrotoxins. It is well documented that the patient was considered a poor dialysis candidate and the patient had declined dialysis on multiple occasions. The nephrology service was consulted by ED. He is critically ill but hopefully with hydration his renal function will improve. His prognosis is poor and I am requesting palliative medicine consult. A family meeting was held yesterday and will resume later today to discuss further care plan and disposition.   3. Hypothermia- resolved, suspect secondary to severe sepsis and dehydration with ongoing acute renal failure and end-stage renal disease. Treated with a warming blanket.  4. Hyperkalemia-treated with insulin in the emergency department also treated with bicarbonate infusion. 5. Hypernatremia - change fluids to 1/2 NS.  6. Metabolic acidosis secondary to renal failure (uremia)-resolved now, treated with IV bicarbonate infusion.  7. Hyperglycemia-treated with IV insulin in the emergency department. Repeat testing shows improvement. 8. Type 2 diabetes mellitus, insulin requiring with renal, neurological and vascular complications-holding basal insulin at this time, treating with sensitive sliding scale every 8 hours. 9. Hypotension-secondary to severe sepsis and dehydration-improving with IV  fluid hydration. Monitor closely in the stepdown unit. 10. UTI-treating with IV Zosyn and follow urine culture and sensitivity results. 11. Altered mental status-slightly improved from admission, multifactorial given severe sepsis, dehydration, renal failure and dementia-will need to treat medically as noted above and follow closely. Appreciate neurology consultation. Code sepsis discontinued. 12. DO NOT RESUSCITATE - I confirmed with Dr. Dalene Seltzer and family.   I also requested a palliative medicine consult regarding goals of care and disposition.  I think family would be in agreement with full comfort care and possible resident hospice placement.   DVT Prophylaxis: heparin SUP: protonix Code Status: DNR Disposition Plan: TBD   Consultants:  nephrology  Subjective: Pt still lethargic does not appear to be in distress  Objective: Vitals:   04/02/17 1948 04/02/17 2300 04/03/17 0426 04/03/17 0712  BP: (!) 144/78  (!) 141/68 (!) 151/76  Pulse: 88   85  Resp: 16   13  Temp: 97.8 F (36.6 C)  98.1 F (36.7 C) 98.5 F (36.9 C)  TempSrc: Oral  Oral Oral  SpO2: 100%   100%  Weight:  73.9 kg (163 lb) 73 kg (161 lb)   Height:   (1.803 m)      Intake/Output Summary (Last 24 hours) at 04/03/17 1008 Last data filed at 04/03/17 8119  Gross per 24 hour  Intake          2531.66 ml  Output             1150 ml  Net          1381.66 ml   Filed Weights   04/02/17 0437 04/02/17 2300 04/03/17 0426  Weight: 74.3 kg (163 lb 14.4 oz) 73.9 kg (163 lb) 73 kg (161  lb)     REVIEW OF SYSTEMS  As per history otherwise all reviewed and reported negative  Exam:  General exam: chronically and critically ill appearing, minimally responsive. Does not appear to be in acute distress. Respiratory system: shallow breathing. Cardiovascular system: S1 & S2 heard,  Gastrointestinal system: Abdomen is nondistended, soft and nontender. Normal bowel sounds heard. Central nervous system: obtunded.  No focal neurological deficits. Extremities: no cyanosis.   Data Reviewed: Basic Metabolic Panel:  Recent Labs Lab 04/01/17 1457 04/01/17 2037 04/02/17 0401 04/03/17 0354  NA 138  --  143 148*  K 5.9*  --  5.0 4.2  CL 116*  --  116* 109  CO2 8*  --  16* 26  GLUCOSE 266*  --  180* 212*  BUN 219*  --  207* 197*  CREATININE 7.78* 7.58* 7.08* 6.69*  CALCIUM 10.7*  --  9.8 9.5  MG  --   --  2.5* 2.6*  PHOS  --   --  5.4* 5.6*   Liver Function Tests:  Recent Labs Lab 04/01/17 1457 04/02/17 0401 04/03/17 0354  AST ALT 15* 17 17  ALKPHOS 63 48 47  BILITOT 1.1 0.8 0.7  PROT 7.8 6.4* 6.2*  ALBUMIN 3.1* 2.6* 2.5*   No results for input(s): LIPASE, AMYLASE in the last 168 hours. No results for input(s): AMMONIA in the last 168 hours. CBC:  Recent Labs Lab 04/01/17 1457 04/01/17 2037 04/02/17 0401 04/03/17 0354  WBC 7.5 9.5 9.2 9.5  NEUTROABS 6.1  --   --   --   HGB 14.3 11.8* 12.0* 11.6*  HCT 42.0 34.8* 35.1* 34.5*  MCV 82.4 80.7 80.9 81.2  PLT 401* 396 354 350   Cardiac Enzymes: No results for input(s): CKTOTAL, CKMB, CKMBINDEX, TROPONINI in the last 168 hours. CBG (last 3)   Recent Labs  04/02/17 1618 04/03/17 0030 04/03/17 0820  GLUCAP 196* 247* 213*   Recent Results (from the past 240 hour(s))  Urine culture     Status: Abnormal   Collection Time: 04/01/17  3:40 PM  Result Value Ref Range Status   Specimen Description URINE, CATHETERIZED  Final   Special Requests NONE  Final   Culture MULTIPLE SPECIES PRESENT, SUGGEST RECOLLECTION (A)  Final   Report Status 04/02/2017 FINAL  Final  Blood culture (routine x 2)     Status: None (Preliminary result)   Collection Time: 04/01/17  3:43 PM  Result Value Ref Range Status   Specimen Description BLOOD RIGHT WRIST  Final   Special Requests   Final    BOTTLES DRAWN AEROBIC AND ANAEROBIC Blood Culture adequate volume   Culture NO GROWTH 2 DAYS  Final   Report Status PENDING  Incomplete  Blood  culture (routine x 2)     Status: None (Preliminary result)   Collection Time: 04/01/17  3:48 PM  Result Value Ref Range Status   Specimen Description BLOOD RIGHT HAND  Final   Special Requests IN PEDIATRIC BOTTLE Blood Culture adequate volume  Final   Culture NO GROWTH 2 DAYS  Final   Report Status PENDING  Incomplete  MRSA PCR Screening     Status: None   Collection Time: 04/02/17 12:52 AM  Result Value Ref Range Status   MRSA by PCR NEGATIVE NEGATIVE Final    Comment:        The GeneXpert MRSA Assay (FDA approved for NASAL specimens only), is one component of a comprehensive MRSA colonization surveillance program.  It is not intended to diagnose MRSA infection nor to guide or monitor treatment for MRSA infections.      Studies: Dg Chest Port 1 View  Result Date: 04/02/2017 CLINICAL DATA:  Sepsis.  Diabetes and hypertension. EXAM: PORTABLE CHEST 1 VIEW COMPARISON:  04/01/2017 FINDINGS: Heart size is normal. There is aortic atherosclerosis. Lungs are clear. The vascularity is normal. No effusions. No collapse. No significant bone finding. IMPRESSION: No active disease. Electronically Signed   By: Paulina Fusi M.D.   On: 04/02/2017 08:25   Dg Chest Portable 1 View  Result Date: 04/01/2017 CLINICAL DATA:  Dyspnea EXAM: PORTABLE CHEST 1 VIEW COMPARISON:  03/10/2017 FINDINGS: Heart is normal in size. There is moderate aortic atherosclerosis without aneurysm. The lungs are free of pneumonic consolidations, effusions or pneumothoraces. No overt pulmonary edema. Atherosclerosis of the extracranial carotid arteries are partially imaged within the neck. No acute nor suspicious osseous abnormality. IMPRESSION: Aortic atherosclerosis.  No active cardiopulmonary disease. Electronically Signed   By: Tollie Eth M.D.   On: 04/01/2017 17:56   Ct Head Code Stroke Wo Contrast  Addendum Date: 04/01/2017   ADDENDUM REPORT: 04/01/2017 15:20 ADDENDUM: These results were called by telephone at the time  of interpretation on 04/01/2017 at 3:20 pm to Dr. Otelia Limes, who verbally acknowledged these results. Electronically Signed   By: Marlan Palau M.D.   On: 04/01/2017 15:20   Result Date: 04/01/2017 CLINICAL DATA:  Code stroke.  Left facial droop EXAM: CT HEAD WITHOUT CONTRAST TECHNIQUE: Contiguous axial images were obtained from the base of the skull through the vertex without intravenous contrast. COMPARISON:  CT head 04/05/2016, MRI 07/17/2014 FINDINGS: Brain: Moderate to advanced atrophy. Chronic microvascular ischemic change throughout the cerebral white matter bilaterally. Chronic infarct left internal capsule and right putamen. Chronic ischemia pons. Negative for acute infarct, hemorrhage, mass Vascular: Negative for hyperdense vessel Skull: Defect in the left frontal bone and scalp unchanged from the prior study. No acute skeletal abnormality Sinuses/Orbits: Cyanosis clear.  Bilateral cataract removal. Other: None ASPECTS (Alberta Stroke Program Early CT Score) - Ganglionic level infarction (caudate, lentiform nuclei, internal capsule, insula, M1-M3 cortex): 7 - Supraganglionic infarction (M4-M6 cortex): 3 Total score (0-10 with 10 being normal): 10 IMPRESSION: 1. Extensive atrophy with chronic microvascular ischemia. No acute abnormality 2. ASPECTS is 10 Electronically Signed: By: Marlan Palau M.D. On: 04/01/2017 15:11   Scheduled Meds: . heparin  5,000 Units Subcutaneous Q8H  . insulin aspart  0-9 Units Subcutaneous Q8H   Continuous Infusions: . sodium chloride 75 mL/hr at 04/03/17 0813  . piperacillin-tazobactam (ZOSYN)  IV Stopped (04/03/17 0707)    Principal Problem:   Metabolic acidosis Active Problems:   Hyperkalemia   Altered mental status, unspecified   Severe dehydration   Acute renal failure superimposed on stage 4 chronic kidney disease (HCC)   Hypotension   Severe sepsis (HCC)   Elevated lactic acid level   Hypothermia   UTI (urinary tract infection)   Diabetes mellitus  with diabetic nephropathy (HCC)   Diabetic peripheral neuropathy (HCC)   History of CVA (cerebrovascular accident)   Dementia   Diabetes mellitus with complication (HCC)  Time spent:   Standley Dakins, MD, FAAFP [facility] Pager [phone]  If 7PM-7AM, please contact night-coverage [email or web address] Password [email or web address] 04/03/2017, 10:08 AM    LOS: 2 days

## 2017-04-03 NOTE — Clinical Social Work Note (Addendum)
Clinical Social Work Assessment  Patient Details  Name: Casey Grimes MRN: [id number] Date of Birth: May 25, 1929  Date of referral:  04/03/17               Reason for consult:  End of Life/Hospice                Permission sought to share information with:  Family Supports Permission granted to share information::     Name::     Casey Grimes  Agency::  Hospice  Relationship::  Spouse  Contact Information:  [phone]   Housing/Transportation Living arrangements for the past 2 months:  [place] of Information:  Spouse Patient Interpreter Needed:  None Criminal Activity/Legal Involvement Pertinent to Current Situation/Hospitalization:  No - Comment as needed Significant Relationships:  Adult Children, Spouse, Friend Lives with:  Facility Resident Do you feel safe going back to the place where you live?  Yes Need for family participation in patient care:  Yes (Comment)  Care giving concerns:  No care giving concerns identified.    Social Worker assessment / plan:  CSW met with spouse-Casey Grimes at pt bedside to address consult for "Residential hospice placement; Family is interested in [facility] for end of life care." Pt resting in bed at time of assessment. CSW introduced self and explained Social Work role. CSW confirmed spouse agreeable to residential hospice. CSW offered choice, providing a list of local residential hospice facilities. Spouse chose [facility]. CSW discussed process as well as grief support resources for family. CSW provided emotional support to spouse. Spouse thanked CSW.  CSW spoke with Casey Grimes at [facility], who stated no bed available at this time, but will pass on referral to weekday Social Workers for follow. CSW updated spouse that no [facility] beds available today. CSW continuing to follow for discharge needs.     Employment status:  Retired Forensic scientist:  Programmer, applications ([employer]) PT  Recommendations:  Not assessed at this time Information / Referral to community resources:  Other (Comment Required) (Residential Hospice)  Patient/Family's Response to care:  Spouse agreeable to residential hospice and understands pt is terminally ill.  Patient/Family's Understanding of and Emotional Response to Diagnosis, Current Treatment, and Prognosis:  Spouse and family understand that pt is in renal failure and it is irreversible. Spouse and family in good spirits at bedside.   Emotional Assessment Appearance:  Appears stated age Attitude/Demeanor/Rapport:  Unable to Assess Affect (typically observed):  Unable to Assess Orientation:   (Unable to assess) Alcohol / Substance use:  Other Psych involvement (Current and /or in the community):  No (Comment)  Discharge Needs  Concerns to be addressed:  Discharge Planning Concerns Readmission within the last 30 days:  Yes Current discharge risk:  Terminally ill, Chronically ill Barriers to Discharge:  Hospice Bed not available, Continued Medical Work up   [employer], LCSW 04/03/2017, 3:47 PM

## 2017-04-03 NOTE — Plan of Care (Signed)
Problem: Safety: Goal: Ability to remain free from injury will improve Outcome: Progressing Bed alarm in place. Assist x1 No fall this shift

## 2017-04-03 NOTE — Progress Notes (Signed)
  Speech Language Pathology Treatment: Dysphagia  Patient Details Name: Casey Grimes MRN: [id number] DOB: 1929-02-13 Today's Date: 04/03/2017 Time: 0865-7846 SLP Time Calculation (min) (ACUTE ONLY): 17 min  Assessment / Plan / Recommendation Clinical Impression  Mild clinical improvements with po trials. Oral manipulation and transit delays. Suspect delayed swallow trigger, decreased multiple swallows noted today and no throat clear or cough this session. Vocal quality clear. He is at higher risk given medical status; noted Palliative care following and family deciding on comfort care. Initiate Dys 1 texture, nectar thick liquid, no straws, crush meds, full supervision and assist. Will follow for education.    HPI HPI: Casey Risko McGirtis a 83 y.o.malewith dementia presented from nursing home by EMS with acute mental status change. He was initially brought in as a CODE STROKE due to concern that he had severe acute mental decompensation. He was evaluated by neurology stroke team and code stroke was cancelled. He had a nonfocal neurological exam. He was noted to be hypotensive and hypothermic on arrival. He has stage V CKD not a candidate for HD and patient had declined HD in the past. He has chronic diastolic CHF, DM type 2 on insulin, poorly controlled hypertension, cerebrovascular disease s/p CVA with associated vascular dementia, chronic respiratory failure and other medical history detailed below. Apparently the patient's nephrologist had begun discussions with family about pursuing palliative care because there are not other medical options for the patient at this point with his progressive end-stage renal failure. Apparently patient has declined since he was discharged last month. He has had poor appetite and poor oral intake at the nursing home.       SLP Plan  Continue with current plan of care       Recommendations  Diet recommendations: Dysphagia 1 (puree);Nectar-thick  liquid Liquids provided via: Cup;No straw Medication Administration: Crushed with puree Supervision: Patient able to self feed;Full supervision/cueing for compensatory strategies Compensations: Slow rate;Small sips/bites Postural Changes and/or Swallow Maneuvers: Seated upright 90 degrees                Oral Care Recommendations: Oral care BID Follow up Recommendations: Skilled Nursing facility SLP Visit Diagnosis: Dysphagia, unspecified (R13.10) Plan: Continue with current plan of care       GO                Royce Macadamia 04/03/2017, 8:50 AM  Breck Coons Lonell Face.Ed [employer] [phone]

## 2017-04-04 NOTE — Progress Notes (Addendum)
Patient was in his bed asleep. No family present. Chaplain shared with charge nurse who said pt was not very alert. Chaplain promised to revisit in case family members will be present.  Chaplain Daizee Firmin.

## 2017-04-04 NOTE — Progress Notes (Signed)
CSW made referral to [facility] in [place]. They have a bed available for patient, but patient's wife is unable to meet with liaison until this evening for paperwork. Hospice will take patient first thing in the morning on 9/18. MD notified. CSW to follow and support with discharge to residential hospice.  Abigail Butts, LCSWA [phone]

## 2017-04-04 NOTE — Progress Notes (Signed)
Daily Progress Note   Patient Name: Casey Grimes       Date: 04/04/2017 DOB: May 14, 1929  Age: 83 y.o. MRN#: [id number] Attending Physician: Cleora Fleet, MD Primary Care Physician: Myrlene Broker, MD Admit Date: 04/01/2017  Reason for Consultation/Follow-up: Establishing goals of care, Non pain symptom management and Pain control  Subjective: I saw and examined Mr. Ozga this afternoon.  He was sleeping but aroused briefly.  Denied pain or any needs.  No family at bedside.  Length of Stay: 3  Current Medications: Scheduled Meds:    Continuous Infusions:   PRN Meds: acetaminophen **OR** acetaminophen, antiseptic oral rinse, fentaNYL (SUBLIMAZE) injection, glycopyrrolate **OR** glycopyrrolate **OR** glycopyrrolate, haloperidol **OR** haloperidol **OR** haloperidol lactate, LORazepam **OR** LORazepam **OR** LORazepam, ondansetron **OR** ondansetron (ZOFRAN) IV, polyvinyl alcohol, RESOURCE THICKENUP CLEAR  Physical Exam      General: Periods of 20+ second apnea, but will arouse and shakes head no to questions of pain. (? Sleep apnea).  Heart: Regular rate and rhythm. No murmur appreciated. Lungs: Diminished Abdomen: Soft, nontender, nondistended, positive bowel sounds.  Ext: No significant edema Skin: Warm and dry  Vital Signs: BP (!) 151/76 (BP Location: Right Arm)   Pulse 79   Temp (!) 97.5 F (36.4 C) (Axillary)   Resp 17   Ht  (1.803 m)   Wt 73 kg (161 lb)   SpO2 98%   BMI 22.45 kg/m  SpO2: SpO2: 98 % O2 Device: O2 Device: Not Delivered O2 Flow Rate:    Intake/output summary:  Intake/Output Summary (Last 24 hours) at 04/04/17 1439 Last data filed at 04/04/17 0100  Gross per 24 hour  Intake                0 ml  Output              650 ml    Net             -650 ml   LBM: Last BM Date: 04/03/17 Baseline Weight: Weight: 72.1 kg (158 lb 15.2 oz) Most recent weight: Weight: 73 kg (161 lb)       Palliative Assessment/Data:    Flowsheet Rows     Most Recent Value  Intake Tab  Referral Department  Hospitalist  Unit at Time of Referral  Med/Surg Unit  Palliative Care Primary Diagnosis  Sepsis/Infectious Disease  Date Notified  04/01/17  Palliative Care Type  New Palliative care  Reason for referral  Clarify Goals of Care  Date of Admission  04/01/17  Date first seen by Palliative Care  04/02/17  # of days Palliative referral response time  1 Day(s)  # of days IP prior to Palliative referral  0  Clinical Assessment  Palliative Performance Scale Score  10%  Pain Max last 24 hours  Not able to report  Pain Min Last 24 hours  Not able to report  Psychosocial & Spiritual Assessment  Palliative Care Outcomes  Patient/Family meeting held?  Yes  Who was at the meeting?  Wife, son, daughter  Palliative Care Outcomes  Clarified goals of care      Patient Active Problem List   Diagnosis Date Noted  . Metabolic acidosis 04/01/2017  . Hyperkalemia 04/01/2017  . Altered mental status, unspecified 04/01/2017  . Severe dehydration 04/01/2017  . Acute renal failure superimposed on stage 4 chronic kidney disease (HCC) 04/01/2017  . Hypotension 04/01/2017  . Severe sepsis (HCC) 04/01/2017  . UTI (urinary tract infection) 04/01/2017  . Elevated lactic acid level 04/01/2017  . Hypothermia 04/01/2017  . Uremia 03/10/2017  . Hyponatremia 03/10/2017  . Acute on chronic diastolic heart failure (HCC) 11/09/2016  . Dementia 11/09/2016  . Diabetes mellitus with complication (HCC)   . Hearing loss due to cerumen impaction, right 10/12/2016  . CKD (chronic kidney disease) 04/29/2015  . Myoclonus 11/24/2014  . Hyperlipidemia 09/18/2014  . History of CVA (cerebrovascular accident) 09/18/2014  . Weakness 07/16/2014  . Chronic  diastolic heart failure (HCC) 07/31/2013  . Acute respiratory failure with hypoxia (HCC) 07/23/2013  . Anemia due to chronic kidney disease 07/23/2013  . Diabetic peripheral neuropathy (HCC) 06/08/2013  . Routine health maintenance 05/04/2012  . Diabetes mellitus with diabetic nephropathy (HCC) 12/21/2008  . Gout 12/20/2008  . Uncontrolled hypertension 01/28/2007    Palliative Care Assessment & Plan   Patient Profile: 82 y.o. male  with past medical history of CHF, CKD V, DM2, HTN, CVA, chronic resp failure admitted on 04/01/2017 with sepsis and renal failure.  Goal moving forward is full comfort care.  Recommendations/Plan:  Appears comfortable on exam.  Hopeful for residential hospice placement for EOL care.  Goals of Care and Additional Recommendations:  Limitations on Scope of Treatment: Full Comfort Care  Code Status:    Code Status Orders        Start     Ordered   04/03/17 1314  Do not attempt resuscitation (DNR)  Continuous    Question Answer Comment  In the event of cardiac or respiratory ARREST Do not call a "code blue"   In the event of cardiac or respiratory ARREST Do not perform Intubation, CPR, defibrillation or ACLS   In the event of cardiac or respiratory ARREST Use medication by any route, position, wound care, and other measures to relive pain and suffering. May use oxygen, suction and manual treatment of airway obstruction as needed for comfort.      04/03/17 1315    Code Status History    Date Active Date Inactive Code Status Order ID Comments User Context   04/01/2017  6:03 PM 04/03/2017  1:15 PM DNR [id number]  Alvira Monday, MD ED   03/10/2017  9:52 PM 03/14/2017  7:43 PM Full Code [id number]  Nancy Nordmann., MD Inpatient   11/09/2016 11:38 AM 11/11/2016  7:03 PM Full Code  [id number]  Russella Dar, NP Inpatient   04/29/2015  2:41 PM 05/05/2015  6:56 PM Full Code [id number]  Ozella Rocks, MD ED   11/22/2014 10:19 PM 11/24/2014  3:54 PM Full Code  [id number]  Hillary Bow, DO ED   09/18/2014  9:22 PM 09/19/2014  5:44 PM Full Code [id number]  Ron Parker, MD ED   07/16/2014  3:46 PM 07/19/2014  9:33 PM Full Code [id number]  Clydia Llano, MD Inpatient   07/23/2013  6:52 AM 07/27/2013  5:02 PM Full Code [id number]  Haydee Monica, MD Inpatient   05/06/2013  6:15 PM 05/10/2013  6:47 PM Full Code [id number]  Clydia Llano, MD Inpatient    Advance Directive Documentation     Most Recent Value  Type of Advance Directive  Out of facility DNR (pink MOST or yellow form)  Pre-existing out of facility DNR order (yellow form or pink MOST form)  Yellow form placed in chart (order not valid for inpatient use)  "MOST" Form in Place?  -       Prognosis:   < 2 weeks  Discharge Planning:  Hospice facility  Care plan was discussed with RN  Thank you for allowing the Palliative Medicine Team to assist in the care of this patient.   Total Time 20 Prolonged Time Billed No      Greater than 50%  of this time was spent counseling and coordinating care related to the above assessment and plan.  Romie Minus, MD  Please contact Palliative Medicine Team phone at [phone] for questions and concerns.

## 2017-04-04 NOTE — Progress Notes (Signed)
PROGRESS NOTE    Casey Grimes  WUJ:[id number]  DOB: 17-Oct-1928  DOA: 04/01/2017 PCP: Myrlene Broker, MD   Brief Admission Hx: Casey Grimes is a 83 y.o. male with dementia presented from nursing home by EMS with acute mental status change, dehydration, sepsis and renal failure.   MDM/Assessment & Plan:   1. Severe Sepsis 2. Acute renal failure on CKD / ESRD- 3. Hypothermia-  4. Hyperkalemia- 5. Hypernatremia -  6. Metabolic acidosis secondary to renal failure (uremia)- 7. Hyperglycemia- 8. Type 2 diabetes mellitus, insulin requiring with renal, neurological and vascular complications 9. Hypotension 10. UTI 11. Altered mental status- 12. DO NOT RESUSCITATE - Pt has been transitioned to full comfort care and awaiting placement at [facility].     DVT Prophylaxis: SUP:  Code Status: DNR Disposition Plan: residential hospice   Consultants:  nephrology  Subjective: Pt appears comfortable and in no distress  Objective: Vitals:   04/02/17 2300 04/03/17 0426 04/03/17 0712 04/04/17 0519  BP:  (!) 141/68 (!) 151/76   Pulse:   85   Resp:   13   Temp:  98.1 F (36.7 C) 98.5 F (36.9 C) (!) 97.5 F (36.4 C)  TempSrc:  Oral Oral Axillary  SpO2:   100%   Weight: 73.9 kg (163 lb) 73 kg (161 lb)    Height:  (1.803 m)       Intake/Output Summary (Last 24 hours) at 04/04/17 9562 Last data filed at 04/04/17 0100  Gross per 24 hour  Intake                0 ml  Output              650 ml  Net             -650 ml   Filed Weights   04/02/17 0437 04/02/17 2300 04/03/17 0426  Weight: 74.3 kg (163 lb 14.4 oz) 73.9 kg (163 lb) 73 kg (161 lb)     REVIEW OF SYSTEMS  As per history otherwise all reviewed and reported negative  Exam:  General exam: chronically and critically ill appearing, minimally responsive. Does not appear to be in acute distress. Respiratory system: shallow breathing. Cardiovascular system: S1 & S2 heard,  Gastrointestinal system:  Abdomen is nondistended, soft and nontender. Normal bowel sounds heard. Central nervous system: obtunded. No focal neurological deficits. Extremities: no cyanosis.   Data Reviewed: Basic Metabolic Panel:  Recent Labs Lab 04/01/17 1457 04/01/17 2037 04/02/17 0401 04/03/17 0354  NA 138  --  143 148*  K 5.9*  --  5.0 4.2  CL 116*  --  116* 109  CO2 8*  --  16* 26  GLUCOSE 266*  --  180* 212*  BUN 219*  --  207* 197*  CREATININE 7.78* 7.58* 7.08* 6.69*  CALCIUM 10.7*  --  9.8 9.5  MG  --   --  2.5* 2.6*  PHOS  --   --  5.4* 5.6*   Liver Function Tests:  Recent Labs Lab 04/01/17 1457 04/02/17 0401 04/03/17 0354  AST ALT 15* 17 17  ALKPHOS 63 48 47  BILITOT 1.1 0.8 0.7  PROT 7.8 6.4* 6.2*  ALBUMIN 3.1* 2.6* 2.5*   No results for input(s): LIPASE, AMYLASE in the last 168 hours. No results for input(s): AMMONIA in the last 168 hours. CBC:  Recent Labs Lab 04/01/17 1457 04/01/17 2037 04/02/17 0401 04/03/17 0354  WBC 7.5 9.5  9.2 9.5  NEUTROABS 6.1  --   --   --   HGB 14.3 11.8* 12.0* 11.6*  HCT 42.0 34.8* 35.1* 34.5*  MCV 82.4 80.7 80.9 81.2  PLT 401* 396 354 350   Cardiac Enzymes: No results for input(s): CKTOTAL, CKMB, CKMBINDEX, TROPONINI in the last 168 hours. CBG (last 3)   Recent Labs  04/02/17 1618 04/03/17 0030 04/03/17 0820  GLUCAP 196* 247* 213*   Recent Results (from the past 240 hour(s))  Urine culture     Status: Abnormal   Collection Time: 04/01/17  3:40 PM  Result Value Ref Range Status   Specimen Description URINE, CATHETERIZED  Final   Special Requests NONE  Final   Culture MULTIPLE SPECIES PRESENT, SUGGEST RECOLLECTION (A)  Final   Report Status 04/02/2017 FINAL  Final  Blood culture (routine x 2)     Status: None (Preliminary result)   Collection Time: 04/01/17  3:43 PM  Result Value Ref Range Status   Specimen Description BLOOD RIGHT WRIST  Final   Special Requests   Final    BOTTLES DRAWN AEROBIC AND ANAEROBIC Blood  Culture adequate volume   Culture NO GROWTH 2 DAYS  Final   Report Status PENDING  Incomplete  Blood culture (routine x 2)     Status: None (Preliminary result)   Collection Time: 04/01/17  3:48 PM  Result Value Ref Range Status   Specimen Description BLOOD RIGHT HAND  Final   Special Requests IN PEDIATRIC BOTTLE Blood Culture adequate volume  Final   Culture NO GROWTH 2 DAYS  Final   Report Status PENDING  Incomplete  Urine culture     Status: Abnormal   Collection Time: 04/02/17 12:35 AM  Result Value Ref Range Status   Specimen Description Urine  Final   Special Requests NONE  Final   Culture MULTIPLE SPECIES PRESENT, SUGGEST RECOLLECTION (A)  Final   Report Status 04/03/2017 FINAL  Final  MRSA PCR Screening     Status: None   Collection Time: 04/02/17 12:52 AM  Result Value Ref Range Status   MRSA by PCR NEGATIVE NEGATIVE Final    Comment:        The GeneXpert MRSA Assay (FDA approved for NASAL specimens only), is one component of a comprehensive MRSA colonization surveillance program. It is not intended to diagnose MRSA infection nor to guide or monitor treatment for MRSA infections.      Studies: No results found. Scheduled Meds:  Continuous Infusions:   Principal Problem:   Metabolic acidosis Active Problems:   Hyperkalemia   Altered mental status, unspecified   Severe dehydration   Acute renal failure superimposed on stage 4 chronic kidney disease (HCC)   Hypotension   Severe sepsis (HCC)   Elevated lactic acid level   Hypothermia   UTI (urinary tract infection)   Diabetes mellitus with diabetic nephropathy (HCC)   Diabetic peripheral neuropathy (HCC)   History of CVA (cerebrovascular accident)   Dementia   Diabetes mellitus with complication (HCC)  Time spent:   Standley Dakins, MD, FAAFP [facility] Pager [phone]  If 7PM-7AM, please contact night-coverage [email or web address] Password [email or web address] 04/04/2017, 8:07 AM    LOS: 3 days

## 2017-04-04 NOTE — Progress Notes (Signed)
CSW following for residential hospice placement. [facility] does not have a bed available. CSW obtained second choice from family - [facility], and made referral. [facility] liaison to follow up with family today. CSW to continue to follow and support with hospice placement once bed available.  Abigail Butts, LCSWA [phone]

## 2017-04-05 DIAGNOSIS — I959 Hypotension, unspecified: Secondary | ICD-10-CM

## 2017-04-05 DIAGNOSIS — N184 Chronic kidney disease, stage 4 (severe): Secondary | ICD-10-CM

## 2017-04-05 DIAGNOSIS — R6520 Severe sepsis without septic shock: Secondary | ICD-10-CM

## 2017-04-05 DIAGNOSIS — E0821 Diabetes mellitus due to underlying condition with diabetic nephropathy: Secondary | ICD-10-CM

## 2017-04-05 DIAGNOSIS — N179 Acute kidney failure, unspecified: Secondary | ICD-10-CM

## 2017-04-05 DIAGNOSIS — Z794 Long term (current) use of insulin: Secondary | ICD-10-CM

## 2017-04-05 DIAGNOSIS — A419 Sepsis, unspecified organism: Principal | ICD-10-CM

## 2017-04-05 DIAGNOSIS — E875 Hyperkalemia: Secondary | ICD-10-CM

## 2017-04-05 DIAGNOSIS — E860 Dehydration: Secondary | ICD-10-CM

## 2017-04-05 DIAGNOSIS — R4182 Altered mental status, unspecified: Secondary | ICD-10-CM

## 2017-04-05 DIAGNOSIS — Z8673 Personal history of transient ischemic attack (TIA), and cerebral infarction without residual deficits: Secondary | ICD-10-CM

## 2017-04-05 DIAGNOSIS — E872 Acidosis: Secondary | ICD-10-CM

## 2017-04-05 DIAGNOSIS — E118 Type 2 diabetes mellitus with unspecified complications: Secondary | ICD-10-CM

## 2017-04-05 NOTE — Plan of Care (Signed)
Problem: Physical Regulation: Goal: Complications related to the disease process, condition or treatment will be avoided or minimized Outcome: Progressing Patient currently on comfort care. When assessed patient asked if he was in pain which he replied no. When asked if he was comfortable he would respond yes.

## 2017-04-05 NOTE — Progress Notes (Signed)
Patient will discharge to [facility] Anticipated discharge date: 04/05/17 Family notified: Halton Neas, spouse Transportation by: [facility]  Nurse to call report to [phone].   CSW signing off.  Abigail Butts, LCSWA  Clinical Social Worker

## 2017-04-05 NOTE — Discharge Summary (Signed)
Physician Discharge Summary  Casey Grimes KVQ:[id number] DOB: [birth year] DOA: 04/01/2017  PCP: Hoyt Koch, MD  Admit date: 04/01/2017 Discharge date: 04/05/2017  Disposition: Residential Hospice   Discharge Condition: Hospice  CODE STATUS: DNR   Brief Hospitalization Summary: Please see all hospital notes, images, labs for full details of the hospitalization.  Historians: Pt unable to participate, history from ED providers, family and EMS, nursing home records  HPI: Casey Grimes is a 83 y.o. male with dementia presented from nursing home by EMS with acute mental status change.  He was initially brought in as a CODE STROKE due to concern that he had severe acute mental decompensation.  He was evaluated by neurology stroke team and code stroke was cancelled. He had a nonfocal neurological exam.  He was noted to be hypotensive and hypothermic on arrival.  He has stage V CKD not a candidate for HD and patient had declined HD in the past.  He has chronic diastolic CHF, DM type 2 on insulin, poorly controlled hypertension, cerebrovascular disease s/p CVA with associated vascular dementia, chronic respiratory failure and other medical history detailed below. Apparently the patient's nephrologist had begun discussions with family about pursuing palliative care because there are not other medical options for the patient at this point with his progressive end-stage renal failure.  Apparently patient has declined since he was discharged last month.  He has had poor appetite and poor oral intake at the nursing home.    ED course: Pt was noted to have severe metabolic acidosis with CO2 of 8, Acute on chronic renal failure with hyperkalemia, severe dehydration, hypotension, abnormal urinalysis and hypothermia.  His blood pressure improved with IVF hydration.  He was started on sepsis protocol, antibiotics, warming blanket and he was started on a bicarbonate infusion.  MDM/Assessment & Plan:    1. Severe Sepsis 2. Acute renal failure on CKD / ESRD 3. Hypothermia-  4. Hyperkalemia- 5. Hypernatremia -  6. Metabolic acidosis secondary to renal failure (uremia)- 7. Hyperglycemia- 8. Type 2 diabetes mellitus, insulin requiring with renal, neurologicaland vascular complications 9. Hypotension 10. UTI 11. Altered mental status- 12. DO NOT RESUSCITATE- The palliative medicine team was consulted and met with family members and discussed his clinical course as well as continued decline in nutrition, cognition, and functional status as well as irreversible renal failure. Pt has been transitioned to full comfort care and being placed in residential hospice.   Pt had initially had been admitted and treated aggressively with IVFs, IV antibiotics with no meaningful recovery or reverse of progressive renal failure and electrolyte abnormalities.  He was not a candidate for hemodialysis per nephrology team.   Code Status:DNR Disposition Plan:residential hospice  Consultants:  Nephrology  Palliative medicine    Discharge Diagnoses:  Principal Problem:   Metabolic acidosis Active Problems:   Hyperkalemia   Altered mental status, unspecified   Severe dehydration   Acute renal failure superimposed on stage 4 chronic kidney disease (HCC)   Hypotension   Severe sepsis (HCC)   Elevated lactic acid level   Hypothermia   UTI (urinary tract infection)   Diabetes mellitus with diabetic nephropathy (HCC)   Diabetic peripheral neuropathy (HCC)   History of CVA (cerebrovascular accident)   Dementia   Diabetes mellitus with complication [facility])  Discharge Instructions:  Allergies as of 04/05/2017   No Known Allergies     Medication List    STOP taking these medications   allopurinol 300 MG tablet Commonly known as:  ZYLOPRIM   amLODipine 10 MG tablet Commonly known as:  NORVASC   aspirin EC 325 MG tablet   atorvastatin 10 MG tablet Commonly known as:  LIPITOR   calcitRIOL  0.25 MCG capsule Commonly known as:  ROCALTROL   carvedilol 25 MG tablet Commonly known as:  COREG   cloNIDine 0.2 MG tablet Commonly known as:  CATAPRES   doxycycline 50 MG tablet Commonly known as:  ADOXA   feeding supplement (ENSURE ENLIVE) Liqd   hydrALAZINE 50 MG tablet Commonly known as:  APRESOLINE   insulin glargine 100 UNIT/ML injection Commonly known as:  LANTUS   KLOR-CON M20 20 MEQ tablet Generic drug:  potassium chloride SA   mirtazapine 7.5 MG tablet Commonly known as:  REMERON   sennosides-docusate sodium 8.6-50 MG tablet Commonly known as:  SENOKOT-S   tobramycin 0.3 % ophthalmic solution Commonly known as:  TOBREX       No Known Allergies Current Discharge Medication List    STOP taking these medications     allopurinol (ZYLOPRIM) 300 MG tablet      amLODipine (NORVASC) 10 MG tablet      aspirin EC 325 MG tablet      atorvastatin (LIPITOR) 10 MG tablet      calcitRIOL (ROCALTROL) 0.25 MCG capsule      cloNIDine (CATAPRES) 0.2 MG tablet      doxycycline (ADOXA) 50 MG tablet      feeding supplement, ENSURE ENLIVE, (ENSURE ENLIVE) LIQD      hydrALAZINE (APRESOLINE) 50 MG tablet      insulin glargine (LANTUS) 100 UNIT/ML injection      KLOR-CON M20 20 MEQ tablet      mirtazapine (REMERON) 7.5 MG tablet      sennosides-docusate sodium (SENOKOT-S) 8.6-50 MG tablet      tobramycin (TOBREX) 0.3 % ophthalmic solution      carvedilol (COREG) 25 MG tablet         Procedures/Studies: Dg Chest 2 View  Result Date: 03/10/2017 CLINICAL DATA:  Weakness, renal failure, possible fluid overload. History of CHF, diabetes, CVA, renal insufficiency. Former smoker. EXAM: CHEST  2 VIEW COMPARISON:  Chest x-ray of November 10, 2016 FINDINGS: The lungs are reasonably well inflated. Patchy increased density is present at the lung bases but is not as conspicuous as on the previous study. The heart is top-normal in size. The pulmonary vascularity is  normal. There is calcification in the wall of the thoracic aorta. The mediastinum is normal in width. The bony thorax exhibits no acute abnormality. IMPRESSION: Bibasilar atelectasis or possible or mild basilar interstitial edema. No definite cephalization of the vascular pattern however. Thoracic aortic atherosclerosis. Electronically Signed   By: David  Martinique M.D.   On: 03/10/2017 15:43   US Renal  Result Date: 03/11/2017 CLINICAL DATA:  Acute kidney injury. History of chronic kidney disease. EXAM: RENAL / URINARY TRACT ULTRASOUND COMPLETE COMPARISON:  None. FINDINGS: Right Kidney: Length: 10.7 cm. Diffuse increase in renal parenchymal echogenicity. Cyst in the mid kidney measures 9 mm. No solid mass or hydronephrosis visualized. Left Kidney: Length: 9.0 cm. Diffuse increase in renal parenchymal echogenicity. No mass or hydronephrosis visualized. Bladder: Decompressed by Foley catheter not well evaluated. IMPRESSION: Increased renal parenchymal echogenicity consistent with chronic medical renal disease. No obstructive uropathy. Electronically Signed   By: Jeb Levering M.D.   On: 03/11/2017 21:06   Dg Chest Port 1 View  Result Date: 04/02/2017 CLINICAL DATA:  Sepsis.  Diabetes and hypertension. EXAM: PORTABLE CHEST  1 VIEW COMPARISON:  04/01/2017 FINDINGS: Heart size is normal. There is aortic atherosclerosis. Lungs are clear. The vascularity is normal. No effusions. No collapse. No significant bone finding. IMPRESSION: No active disease. Electronically Signed   By: Nelson Chimes M.D.   On: 04/02/2017 08:25   Dg Chest Portable 1 View  Result Date: 04/01/2017 CLINICAL DATA:  Dyspnea EXAM: PORTABLE CHEST 1 VIEW COMPARISON:  03/10/2017 FINDINGS: Heart is normal in size. There is moderate aortic atherosclerosis without aneurysm. The lungs are free of pneumonic consolidations, effusions or pneumothoraces. No overt pulmonary edema. Atherosclerosis of the extracranial carotid arteries are partially imaged  within the neck. No acute nor suspicious osseous abnormality. IMPRESSION: Aortic atherosclerosis.  No active cardiopulmonary disease. Electronically Signed   By: Ashley Royalty M.D.   On: 04/01/2017 17:56   Ct Head Code Stroke Wo Contrast  Addendum Date: 04/01/2017   ADDENDUM REPORT: 04/01/2017 15:20 ADDENDUM: These results were called by telephone at the time of interpretation on 04/01/2017 at 3:20 pm to Dr. Cheral Marker, who verbally acknowledged these results. Electronically Signed   By: Franchot Gallo M.D.   On: 04/01/2017 15:20   Result Date: 04/01/2017 CLINICAL DATA:  Code stroke.  Left facial droop EXAM: CT HEAD WITHOUT CONTRAST TECHNIQUE: Contiguous axial images were obtained from the base of the skull through the vertex without intravenous contrast. COMPARISON:  CT head 04/05/2016, MRI 07/17/2014 FINDINGS: Brain: Moderate to advanced atrophy. Chronic microvascular ischemic change throughout the cerebral white matter bilaterally. Chronic infarct left internal capsule and right putamen. Chronic ischemia pons. Negative for acute infarct, hemorrhage, mass Vascular: Negative for hyperdense vessel Skull: Defect in the left frontal bone and scalp unchanged from the prior study. No acute skeletal abnormality Sinuses/Orbits: Cyanosis clear.  Bilateral cataract removal. Other: None ASPECTS ([place] Stroke Program Early CT Score) - Ganglionic level infarction (caudate, lentiform nuclei, internal capsule, insula, M1-M3 cortex): 7 - Supraganglionic infarction (M4-M6 cortex): 3 Total score (0-10 with 10 being normal): 10 IMPRESSION: 1. Extensive atrophy with chronic microvascular ischemia. No acute abnormality 2. ASPECTS is 10 Electronically Signed: By: Franchot Gallo M.D. On: 04/01/2017 15:11      Subjective: Pt appears comfortable.   Discharge Exam: Vitals:   04/04/17 1000 04/04/17 2300  BP:  127/64  Pulse: 79 71  Resp: 17   Temp:  (!) 97.4 F (36.3 C)  SpO2: 98%    Vitals:   04/03/17 0712 04/04/17 0519  04/04/17 1000 04/04/17 2300  BP: (!) 151/76   127/64  Pulse: 85  79 71  Resp: 13  17   Temp: 98.5 F (36.9 C) (!) 97.5 F (36.4 C)  (!) 97.4 F (36.3 C)  TempSrc: Oral Axillary  Axillary  SpO2: 100%  98%   Weight:      Height:       General exam: chronically and critically ill appearing, minimally responsive. Does not appear to be in acute distress. Respiratory system: shallow breathing. Cardiovascular system: S1 & S2 heard,  Gastrointestinal system: Abdomen is nondistended, soft and nontender. Normal bowel sounds heard. Central nervous system: obtunded. No focal neurological deficits. Extremities: no cyanosis.    The results of significant diagnostics from this hospitalization (including imaging, microbiology, ancillary and laboratory) are listed below for reference.     Microbiology: Recent Results (from the past 240 hour(s))  Urine culture     Status: Abnormal   Collection Time: 04/01/17  3:40 PM  Result Value Ref Range Status   Specimen Description URINE, CATHETERIZED  Final  Special Requests NONE  Final   Culture MULTIPLE SPECIES PRESENT, SUGGEST RECOLLECTION (A)  Final   Report Status 04/02/2017 FINAL  Final  Blood culture (routine x 2)     Status: None (Preliminary result)   Collection Time: 04/01/17  3:43 PM  Result Value Ref Range Status   Specimen Description BLOOD RIGHT WRIST  Final   Special Requests   Final    BOTTLES DRAWN AEROBIC AND ANAEROBIC Blood Culture adequate volume   Culture NO GROWTH 3 DAYS  Final   Report Status PENDING  Incomplete  Blood culture (routine x 2)     Status: None (Preliminary result)   Collection Time: 04/01/17  3:48 PM  Result Value Ref Range Status   Specimen Description BLOOD RIGHT HAND  Final   Special Requests IN PEDIATRIC BOTTLE Blood Culture adequate volume  Final   Culture NO GROWTH 3 DAYS  Final   Report Status PENDING  Incomplete  Urine culture     Status: Abnormal   Collection Time: 04/02/17 12:35 AM  Result Value Ref  Range Status   Specimen Description Urine  Final   Special Requests NONE  Final   Culture MULTIPLE SPECIES PRESENT, SUGGEST RECOLLECTION (A)  Final   Report Status 04/03/2017 FINAL  Final  MRSA PCR Screening     Status: None   Collection Time: 04/02/17 12:52 AM  Result Value Ref Range Status   MRSA by PCR NEGATIVE NEGATIVE Final    Comment:        The GeneXpert MRSA Assay (FDA approved for NASAL specimens only), is one component of a comprehensive MRSA colonization surveillance program. It is not intended to diagnose MRSA infection nor to guide or monitor treatment for MRSA infections.      Labs: BNP (last 3 results)  Recent Labs  11/09/16 0818  BNP 660.6*   Basic Metabolic Panel:  Recent Labs Lab 04/01/17 1457 04/01/17 2037 04/02/17 0401 04/03/17 0354  NA 138  --  143 148*  K 5.9*  --  5.0 4.2  CL 116*  --  116* 109  CO2 8*  --  16* 26  GLUCOSE 266*  --  180* 212*  BUN 219*  --  207* 197*  CREATININE 7.78* 7.58* 7.08* 6.69*  CALCIUM 10.7*  --  9.8 9.5  MG  --   --  2.5* 2.6*  PHOS  --   --  5.4* 5.6*   Liver Function Tests:  Recent Labs Lab 04/01/17 1457 04/02/17 0401 04/03/17 0354  AST '31 22 26  ' ALT 15* 17 17  ALKPHOS 63 48 47  BILITOT 1.1 0.8 0.7  PROT 7.8 6.4* 6.2*  ALBUMIN 3.1* 2.6* 2.5*   No results for input(s): LIPASE, AMYLASE in the last 168 hours. No results for input(s): AMMONIA in the last 168 hours. CBC:  Recent Labs Lab 04/01/17 1457 04/01/17 2037 04/02/17 0401 04/03/17 0354  WBC 7.5 9.5 9.2 9.5  NEUTROABS 6.1  --   --   --   HGB 14.3 11.8* 12.0* 11.6*  HCT 42.0 34.8* 35.1* 34.5*  MCV 82.4 80.7 80.9 81.2  PLT 401* 396 354 350   Cardiac Enzymes: No results for input(s): CKTOTAL, CKMB, CKMBINDEX, TROPONINI in the last 168 hours. BNP: Invalid input(s): POCBNP CBG:  Recent Labs Lab 04/02/17 0741 04/02/17 1137 04/02/17 1618 04/03/17 0030 04/03/17 0820  GLUCAP 188* 203* 196* 247* 213*   D-Dimer No results for  input(s): DDIMER in the last 72 hours. Hgb A1c No results for  input(s): HGBA1C in the last 72 hours. Lipid Profile No results for input(s): CHOL, HDL, LDLCALC, TRIG, CHOLHDL, LDLDIRECT in the last 72 hours. Thyroid function studies No results for input(s): TSH, T4TOTAL, T3FREE, THYROIDAB in the last 72 hours.  Invalid input(s): FREET3 Anemia work up No results for input(s): VITAMINB12, FOLATE, FERRITIN, TIBC, IRON, RETICCTPCT in the last 72 hours. Urinalysis    Component Value Date/Time   COLORURINE RED (A) 04/01/2017 1540   APPEARANCEUR TURBID (A) 04/01/2017 1540   LABSPEC  04/01/2017 1540    TEST NOT REPORTED DUE TO COLOR INTERFERENCE OF URINE PIGMENT   PHURINE  04/01/2017 1540    TEST NOT REPORTED DUE TO COLOR INTERFERENCE OF URINE PIGMENT   GLUCOSEU (A) 04/01/2017 1540    TEST NOT REPORTED DUE TO COLOR INTERFERENCE OF URINE PIGMENT   HGBUR (A) 04/01/2017 1540    TEST NOT REPORTED DUE TO COLOR INTERFERENCE OF URINE PIGMENT   BILIRUBINUR (A) 04/01/2017 1540    TEST NOT REPORTED DUE TO COLOR INTERFERENCE OF URINE PIGMENT   KETONESUR (A) 04/01/2017 1540    TEST NOT REPORTED DUE TO COLOR INTERFERENCE OF URINE PIGMENT   PROTEINUR (A) 04/01/2017 1540    TEST NOT REPORTED DUE TO COLOR INTERFERENCE OF URINE PIGMENT   UROBILINOGEN 0.2 11/22/2014 1810   NITRITE (A) 04/01/2017 1540    TEST NOT REPORTED DUE TO COLOR INTERFERENCE OF URINE PIGMENT   LEUKOCYTESUR (A) 04/01/2017 1540    TEST NOT REPORTED DUE TO COLOR INTERFERENCE OF URINE PIGMENT   Sepsis Labs Invalid input(s): PROCALCITONIN,  WBC,  LACTICIDVEN Microbiology Recent Results (from the past 240 hour(s))  Urine culture     Status: Abnormal   Collection Time: 04/01/17  3:40 PM  Result Value Ref Range Status   Specimen Description URINE, CATHETERIZED  Final   Special Requests NONE  Final   Culture MULTIPLE SPECIES PRESENT, SUGGEST RECOLLECTION (A)  Final   Report Status 04/02/2017 FINAL  Final  Blood culture (routine x 2)      Status: None (Preliminary result)   Collection Time: 04/01/17  3:43 PM  Result Value Ref Range Status   Specimen Description BLOOD RIGHT WRIST  Final   Special Requests   Final    BOTTLES DRAWN AEROBIC AND ANAEROBIC Blood Culture adequate volume   Culture NO GROWTH 3 DAYS  Final   Report Status PENDING  Incomplete  Blood culture (routine x 2)     Status: None (Preliminary result)   Collection Time: 04/01/17  3:48 PM  Result Value Ref Range Status   Specimen Description BLOOD RIGHT HAND  Final   Special Requests IN PEDIATRIC BOTTLE Blood Culture adequate volume  Final   Culture NO GROWTH 3 DAYS  Final   Report Status PENDING  Incomplete  Urine culture     Status: Abnormal   Collection Time: 04/02/17 12:35 AM  Result Value Ref Range Status   Specimen Description Urine  Final   Special Requests NONE  Final   Culture MULTIPLE SPECIES PRESENT, SUGGEST RECOLLECTION (A)  Final   Report Status 04/03/2017 FINAL  Final  MRSA PCR Screening     Status: None   Collection Time: 04/02/17 12:52 AM  Result Value Ref Range Status   MRSA by PCR NEGATIVE NEGATIVE Final    Comment:        The GeneXpert MRSA Assay (FDA approved for NASAL specimens only), is one component of a comprehensive MRSA colonization surveillance program. It is not intended to diagnose MRSA infection  nor to guide or monitor treatment for MRSA infections.     Time coordinating discharge: 28 mins  SIGNED:  Irwin Brakeman, MD  [facility] 04/05/2017, 7:10 AM Pager [phone]  If 7PM-7AM, please contact night-coverage [email or web address] Password [email or web address]

## 2017-04-05 NOTE — Care Management Important Message (Signed)
Important Message  Patient Details  Name: Casey Grimes MRN: [id number] Date of Birth: [birth year]   Medicare Important Message Given:  Yes    [name] Abena 04/05/2017, 8:42 AM

## 2017-04-05 NOTE — Consult Note (Signed)
[facility]: Met with pt wife Jethro Bastos and two son's a daughter in law, and pt's sister. The pt is minimally responsive. He opens his eyes to sternal rub and said yes once. Reviewed the pt's wishes, and hospice service agreement. Family is on board and goals are in line with hospice philosophy. Pt accepted to [facility]. Offered bed to family they accepted. [facility] called to transport pt. Pt being picked up at 1030am. Thank you for this referral. Webb Silversmith RN

## 2017-04-06 LAB — CULTURE, BLOOD (ROUTINE X 2)
CULTURE: NO GROWTH
Culture: NO GROWTH
SPECIAL REQUESTS: ADEQUATE
Special Requests: ADEQUATE

## 2017-04-07 ENCOUNTER — Telehealth: Payer: Self-pay | Admitting: Internal Medicine

## 2017-04-18 NOTE — Telephone Encounter (Signed)
Pt passed at 3:30am this morning.

## 2017-04-18 DEATH — deceased

## 2017-04-21 ENCOUNTER — Other Ambulatory Visit: Payer: Self-pay | Admitting: Cardiovascular Disease

## 2017-04-22 NOTE — Telephone Encounter (Signed)
STOP taking these medications     HUMALOG 100 UNIT/ML injection      metolazone (ZAROXOLYN) 2.5 MG tablet      torsemide (DEMADEX) 20 MG tablet      metolazone (ZAROXOLYN) 2.5 MG tablet         03/14/2017 Med was stopped at hospital discharge.
